# Patient Record
Sex: Male | Born: 1965 | Race: White | Hispanic: No | Marital: Married
Health system: Southern US, Community
[De-identification: ages and names within clinical notes are randomized; demographics above are authoritative.]

## PROBLEM LIST (undated history)

## (undated) DIAGNOSIS — E785 Hyperlipidemia, unspecified: Secondary | ICD-10-CM

## (undated) HISTORY — PX: CARDIAC CATHETERIZATION: SHX172

---

## 1999-05-06 ENCOUNTER — Ambulatory Visit (HOSPITAL_COMMUNITY): Admission: RE | Admit: 1999-05-06 | Discharge: 1999-05-06 | Payer: Self-pay | Admitting: *Deleted

## 2013-02-22 ENCOUNTER — Encounter: Payer: Self-pay | Admitting: Internal Medicine

## 2013-03-15 ENCOUNTER — Ambulatory Visit: Payer: Self-pay | Admitting: Internal Medicine

## 2017-09-19 DIAGNOSIS — H66009 Acute suppurative otitis media without spontaneous rupture of ear drum, unspecified ear: Secondary | ICD-10-CM | POA: Diagnosis not present

## 2017-09-29 DIAGNOSIS — H66009 Acute suppurative otitis media without spontaneous rupture of ear drum, unspecified ear: Secondary | ICD-10-CM | POA: Diagnosis not present

## 2017-12-07 DIAGNOSIS — H9312 Tinnitus, left ear: Secondary | ICD-10-CM | POA: Diagnosis not present

## 2017-12-07 DIAGNOSIS — H9123 Sudden idiopathic hearing loss, bilateral: Secondary | ICD-10-CM | POA: Diagnosis not present

## 2017-12-07 DIAGNOSIS — H9042 Sensorineural hearing loss, unilateral, left ear, with unrestricted hearing on the contralateral side: Secondary | ICD-10-CM | POA: Diagnosis not present

## 2017-12-19 DIAGNOSIS — H9122 Sudden idiopathic hearing loss, left ear: Secondary | ICD-10-CM | POA: Diagnosis not present

## 2017-12-29 ENCOUNTER — Other Ambulatory Visit: Payer: Self-pay | Admitting: Otolaryngology

## 2017-12-29 DIAGNOSIS — H9122 Sudden idiopathic hearing loss, left ear: Secondary | ICD-10-CM

## 2018-01-14 ENCOUNTER — Ambulatory Visit
Admission: RE | Admit: 2018-01-14 | Discharge: 2018-01-14 | Disposition: A | Discharge status: Home / Self Care | Payer: 59 | Source: Ambulatory Visit | Attending: Otolaryngology | Admitting: Otolaryngology

## 2018-01-14 DIAGNOSIS — H9192 Unspecified hearing loss, left ear: Secondary | ICD-10-CM | POA: Diagnosis not present

## 2018-01-14 DIAGNOSIS — H9122 Sudden idiopathic hearing loss, left ear: Secondary | ICD-10-CM

## 2018-01-14 MED ORDER — GADOBENATE DIMEGLUMINE 529 MG/ML IV SOLN
15.0000 mL | Freq: Once | INTRAVENOUS | Status: AC | PRN
  Administered 2018-01-14: 15 mL via INTRAVENOUS

## 2018-04-24 DIAGNOSIS — J0100 Acute maxillary sinusitis, unspecified: Secondary | ICD-10-CM | POA: Diagnosis not present

## 2018-04-27 DIAGNOSIS — J209 Acute bronchitis, unspecified: Secondary | ICD-10-CM | POA: Diagnosis not present

## 2018-04-27 DIAGNOSIS — J0100 Acute maxillary sinusitis, unspecified: Secondary | ICD-10-CM | POA: Diagnosis not present

## 2019-05-16 HISTORY — PX: TRANSTHORACIC ECHOCARDIOGRAM: SHX275

## 2019-05-29 ENCOUNTER — Encounter (HOSPITAL_COMMUNITY): Payer: Self-pay | Admitting: Cardiology

## 2019-05-29 ENCOUNTER — Other Ambulatory Visit: Payer: Self-pay

## 2019-05-29 ENCOUNTER — Inpatient Hospital Stay (HOSPITAL_COMMUNITY)
Admission: EM | Admit: 2019-05-29 | Discharge: 2019-06-01 | DRG: 246 | Disposition: A | Discharge status: Home / Self Care | Payer: BLUE CROSS/BLUE SHIELD | Attending: Cardiology | Admitting: Cardiology

## 2019-05-29 ENCOUNTER — Inpatient Hospital Stay (HOSPITAL_COMMUNITY)
Admission: EM | Disposition: A | Discharge status: Home / Self Care | Payer: Self-pay | Source: Home / Self Care | Attending: Cardiology

## 2019-05-29 DIAGNOSIS — Z9861 Coronary angioplasty status: Secondary | ICD-10-CM | POA: Diagnosis present

## 2019-05-29 DIAGNOSIS — Y838 Other surgical procedures as the cause of abnormal reaction of the patient, or of later complication, without mention of misadventure at the time of the procedure: Secondary | ICD-10-CM | POA: Diagnosis not present

## 2019-05-29 DIAGNOSIS — I2102 ST elevation (STEMI) myocardial infarction involving left anterior descending coronary artery: Secondary | ICD-10-CM | POA: Diagnosis present

## 2019-05-29 DIAGNOSIS — Y92234 Operating room of hospital as the place of occurrence of the external cause: Secondary | ICD-10-CM | POA: Diagnosis not present

## 2019-05-29 DIAGNOSIS — Y712 Prosthetic and other implants, materials and accessory cardiovascular devices associated with adverse incidents: Secondary | ICD-10-CM | POA: Diagnosis not present

## 2019-05-29 DIAGNOSIS — Z20822 Contact with and (suspected) exposure to covid-19: Secondary | ICD-10-CM | POA: Diagnosis present

## 2019-05-29 DIAGNOSIS — I255 Ischemic cardiomyopathy: Secondary | ICD-10-CM | POA: Diagnosis present

## 2019-05-29 DIAGNOSIS — I2510 Atherosclerotic heart disease of native coronary artery without angina pectoris: Secondary | ICD-10-CM

## 2019-05-29 DIAGNOSIS — E785 Hyperlipidemia, unspecified: Secondary | ICD-10-CM | POA: Diagnosis present

## 2019-05-29 DIAGNOSIS — D72829 Elevated white blood cell count, unspecified: Secondary | ICD-10-CM | POA: Diagnosis not present

## 2019-05-29 DIAGNOSIS — I2109 ST elevation (STEMI) myocardial infarction involving other coronary artery of anterior wall: Secondary | ICD-10-CM | POA: Diagnosis not present

## 2019-05-29 DIAGNOSIS — I25110 Atherosclerotic heart disease of native coronary artery with unstable angina pectoris: Secondary | ICD-10-CM | POA: Diagnosis not present

## 2019-05-29 DIAGNOSIS — R079 Chest pain, unspecified: Secondary | ICD-10-CM | POA: Diagnosis not present

## 2019-05-29 DIAGNOSIS — R000 Tachycardia, unspecified: Secondary | ICD-10-CM | POA: Diagnosis not present

## 2019-05-29 DIAGNOSIS — I9788 Other intraoperative complications of the circulatory system, not elsewhere classified: Secondary | ICD-10-CM | POA: Diagnosis not present

## 2019-05-29 DIAGNOSIS — I252 Old myocardial infarction: Secondary | ICD-10-CM

## 2019-05-29 DIAGNOSIS — I5041 Acute combined systolic (congestive) and diastolic (congestive) heart failure: Secondary | ICD-10-CM | POA: Diagnosis present

## 2019-05-29 DIAGNOSIS — I213 ST elevation (STEMI) myocardial infarction of unspecified site: Secondary | ICD-10-CM

## 2019-05-29 DIAGNOSIS — Z8249 Family history of ischemic heart disease and other diseases of the circulatory system: Secondary | ICD-10-CM

## 2019-05-29 DIAGNOSIS — Z955 Presence of coronary angioplasty implant and graft: Secondary | ICD-10-CM

## 2019-05-29 DIAGNOSIS — R0789 Other chest pain: Secondary | ICD-10-CM | POA: Diagnosis not present

## 2019-05-29 DIAGNOSIS — I10 Essential (primary) hypertension: Secondary | ICD-10-CM | POA: Insufficient documentation

## 2019-05-29 DIAGNOSIS — I499 Cardiac arrhythmia, unspecified: Secondary | ICD-10-CM | POA: Diagnosis not present

## 2019-05-29 HISTORY — DX: Atherosclerotic heart disease of native coronary artery without angina pectoris: I25.10

## 2019-05-29 HISTORY — DX: Old myocardial infarction: I25.2

## 2019-05-29 HISTORY — PX: CORONARY/GRAFT ACUTE MI REVASCULARIZATION: CATH118305

## 2019-05-29 HISTORY — PX: LEFT HEART CATH AND CORONARY ANGIOGRAPHY: CATH118249

## 2019-05-29 HISTORY — DX: Hyperlipidemia, unspecified: E78.5

## 2019-05-29 HISTORY — PX: CORONARY STENT INTERVENTION: CATH118234

## 2019-05-29 HISTORY — DX: ST elevation (STEMI) myocardial infarction of unspecified site: I21.3

## 2019-05-29 LAB — TROPONIN I (HIGH SENSITIVITY)
Troponin I (High Sensitivity): 3656 ng/L (ref <18)
Troponin I (High Sensitivity): >27000 ng/L (ref <18)
Troponin I (High Sensitivity): >27000 ng/L (ref <18)
Troponin I (High Sensitivity): >27000 ng/L (ref <18)
Troponin I (High Sensitivity): >27000 ng/L (ref <18)

## 2019-05-29 LAB — CBC WITH DIFFERENTIAL/PLATELET
Abs Immature Granulocytes: 0.07 10*3/uL (ref 0.00–0.07)
Basophils Absolute: 0.1 10*3/uL (ref 0.0–0.1)
Basophils Relative: 1 %
Eosinophils Absolute: 0.1 10*3/uL (ref 0.0–0.5)
Eosinophils Relative: 0 %
HCT: 43.4 % (ref 39.0–52.0)
Hemoglobin: 15.6 g/dL (ref 13.0–17.0)
Immature Granulocytes: 1 %
Lymphocytes Relative: 19 %
Lymphs Abs: 2.4 10*3/uL (ref 0.7–4.0)
MCH: 31.8 pg (ref 26.0–34.0)
MCHC: 35.9 g/dL (ref 30.0–36.0)
MCV: 88.4 fL (ref 80.0–100.0)
Monocytes Absolute: 1.1 10*3/uL — ABNORMAL HIGH (ref 0.1–1.0)
Monocytes Relative: 8 %
Neutro Abs: 9 10*3/uL — ABNORMAL HIGH (ref 1.7–7.7)
Neutrophils Relative %: 71 %
Platelets: 279 10*3/uL (ref 150–400)
RBC: 4.91 MIL/uL (ref 4.22–5.81)
RDW: 11.5 % (ref 11.5–15.5)
WBC: 12.7 10*3/uL — ABNORMAL HIGH (ref 4.0–10.5)
nRBC: 0 % (ref 0.0–0.2)

## 2019-05-29 LAB — CBC
HCT: 44.5 % (ref 39.0–52.0)
Hemoglobin: 15.9 g/dL (ref 13.0–17.0)
MCH: 31.9 pg (ref 26.0–34.0)
MCHC: 35.7 g/dL (ref 30.0–36.0)
MCV: 89.2 fL (ref 80.0–100.0)
Platelets: 301 10*3/uL (ref 150–400)
RBC: 4.99 MIL/uL (ref 4.22–5.81)
RDW: 11.8 % (ref 11.5–15.5)
WBC: 16.4 10*3/uL — ABNORMAL HIGH (ref 4.0–10.5)
nRBC: 0 % (ref 0.0–0.2)

## 2019-05-29 LAB — COMPREHENSIVE METABOLIC PANEL
ALT: 36 U/L (ref 0–44)
AST: 89 U/L — ABNORMAL HIGH (ref 15–41)
Albumin: 3.7 g/dL (ref 3.5–5.0)
Alkaline Phosphatase: 77 U/L (ref 38–126)
Anion gap: 12 (ref 5–15)
BUN: 8 mg/dL (ref 6–20)
CO2: 19 mmol/L — ABNORMAL LOW (ref 22–32)
Calcium: 8.8 mg/dL — ABNORMAL LOW (ref 8.9–10.3)
Chloride: 105 mmol/L (ref 98–111)
Creatinine, Ser: 0.94 mg/dL (ref 0.61–1.24)
GFR calc Af Amer: >60 mL/min (ref >60)
GFR calc non Af Amer: >60 mL/min (ref >60)
Glucose, Bld: 127 mg/dL — ABNORMAL HIGH (ref 70–99)
Potassium: 3.5 mmol/L (ref 3.5–5.1)
Sodium: 136 mmol/L (ref 135–145)
Total Bilirubin: 0.7 mg/dL (ref 0.3–1.2)
Total Protein: 6.3 g/dL — ABNORMAL LOW (ref 6.5–8.1)

## 2019-05-29 LAB — POCT ACTIVATED CLOTTING TIME
Activated Clotting Time: 0 seconds
Activated Clotting Time: 285 seconds
Activated Clotting Time: 345 seconds
Activated Clotting Time: 351 seconds

## 2019-05-29 LAB — LIPID PANEL
Cholesterol: 169 mg/dL (ref 0–200)
HDL: 30 mg/dL — ABNORMAL LOW (ref >40)
LDL Cholesterol: 126 mg/dL — ABNORMAL HIGH (ref 0–99)
Total CHOL/HDL Ratio: 5.6 RATIO
Triglycerides: 65 mg/dL (ref <150)
VLDL: 13 mg/dL (ref 0–40)

## 2019-05-29 LAB — CREATININE, SERUM
Creatinine, Ser: 1 mg/dL (ref 0.61–1.24)
GFR calc Af Amer: >60 mL/min (ref >60)
GFR calc non Af Amer: >60 mL/min (ref >60)

## 2019-05-29 LAB — TSH: TSH: 1.221 u[IU]/mL (ref 0.350–4.500)

## 2019-05-29 LAB — RESPIRATORY PANEL BY RT PCR (FLU A&B, COVID)
Influenza A by PCR: NEGATIVE
Influenza B by PCR: NEGATIVE
SARS Coronavirus 2 by RT PCR: NEGATIVE

## 2019-05-29 LAB — PROTIME-INR
INR: 1.1 (ref 0.8–1.2)
Prothrombin Time: 14.4 seconds (ref 11.4–15.2)

## 2019-05-29 LAB — POCT I-STAT, CHEM 8
BUN: 7 mg/dL (ref 6–20)
Calcium, Ion: 1.22 mmol/L (ref 1.15–1.40)
Chloride: 103 mmol/L (ref 98–111)
Creatinine, Ser: 0.8 mg/dL (ref 0.61–1.24)
Glucose, Bld: 127 mg/dL — ABNORMAL HIGH (ref 70–99)
HCT: 43 % (ref 39.0–52.0)
Hemoglobin: 14.6 g/dL (ref 13.0–17.0)
Potassium: 3.5 mmol/L (ref 3.5–5.1)
Sodium: 140 mmol/L (ref 135–145)
TCO2: 24 mmol/L (ref 22–32)

## 2019-05-29 LAB — MRSA PCR SCREENING: MRSA by PCR: NEGATIVE

## 2019-05-29 LAB — APTT: aPTT: 31 seconds (ref 24–36)

## 2019-05-29 LAB — HIV ANTIBODY (ROUTINE TESTING W REFLEX): HIV Screen 4th Generation wRfx: NONREACTIVE

## 2019-05-29 SURGERY — CORONARY/GRAFT ACUTE MI REVASCULARIZATION
Anesthesia: LOCAL

## 2019-05-29 MED ORDER — MIDAZOLAM HCL 2 MG/2ML IJ SOLN
INTRAMUSCULAR | Status: DC | PRN
  Administered 2019-05-29: 2 mg via INTRAVENOUS

## 2019-05-29 MED ORDER — HEPARIN (PORCINE) IN NACL 1000-0.9 UT/500ML-% IV SOLN
INTRAVENOUS | Status: DC | PRN
  Administered 2019-05-29: 500 mL

## 2019-05-29 MED ORDER — HEPARIN SODIUM (PORCINE) 1000 UNIT/ML IJ SOLN
INTRAMUSCULAR | Status: DC | PRN
  Administered 2019-05-29: 9000 [IU] via INTRAVENOUS
  Administered 2019-05-29: 3000 [IU] via INTRAVENOUS

## 2019-05-29 MED ORDER — LIDOCAINE HCL (PF) 1 % IJ SOLN
INTRAMUSCULAR | Status: AC
  Filled 2019-05-29: qty 30

## 2019-05-29 MED ORDER — VERAPAMIL HCL 2.5 MG/ML IV SOLN
INTRAVENOUS | Status: AC
  Filled 2019-05-29: qty 2

## 2019-05-29 MED ORDER — ACETAMINOPHEN 325 MG PO TABS
650.0000 mg | ORAL_TABLET | ORAL | Status: DC | PRN
  Administered 2019-05-30 – 2019-05-31 (×3): 650 mg via ORAL
  Filled 2019-05-29 (×3): qty 2

## 2019-05-29 MED ORDER — HEPARIN SODIUM (PORCINE) 1000 UNIT/ML IJ SOLN
INTRAMUSCULAR | Status: AC
  Filled 2019-05-29: qty 1

## 2019-05-29 MED ORDER — NITROGLYCERIN 1 MG/10 ML FOR IR/CATH LAB
INTRA_ARTERIAL | Status: DC | PRN
  Administered 2019-05-29 (×2): 100 ug

## 2019-05-29 MED ORDER — HEPARIN (PORCINE) IN NACL 1000-0.9 UT/500ML-% IV SOLN
INTRAVENOUS | Status: DC | PRN
  Administered 2019-05-29 (×2): 500 mL

## 2019-05-29 MED ORDER — IOHEXOL 350 MG/ML SOLN
INTRAVENOUS | Status: AC
  Filled 2019-05-29: qty 1

## 2019-05-29 MED ORDER — MIDAZOLAM HCL 2 MG/2ML IJ SOLN
INTRAMUSCULAR | Status: AC
  Filled 2019-05-29: qty 2

## 2019-05-29 MED ORDER — MORPHINE SULFATE (PF) 2 MG/ML IV SOLN
2.0000 mg | INTRAVENOUS | Status: AC | PRN
  Administered 2019-05-29 (×2): 2 mg via INTRAVENOUS
  Filled 2019-05-29 (×2): qty 1

## 2019-05-29 MED ORDER — TIROFIBAN (AGGRASTAT) BOLUS VIA INFUSION
INTRAVENOUS | Status: DC | PRN
  Administered 2019-05-29: 2142.5 ug via INTRAVENOUS

## 2019-05-29 MED ORDER — TICAGRELOR 90 MG PO TABS
ORAL_TABLET | ORAL | Status: DC | PRN
  Administered 2019-05-29: 180 mg via ORAL

## 2019-05-29 MED ORDER — SODIUM CHLORIDE 0.9% FLUSH
3.0000 mL | Freq: Two times a day (BID) | INTRAVENOUS | Status: DC
  Administered 2019-05-29 – 2019-05-31 (×4): 3 mL via INTRAVENOUS

## 2019-05-29 MED ORDER — TICAGRELOR 90 MG PO TABS
ORAL_TABLET | ORAL | Status: AC
  Filled 2019-05-29: qty 2

## 2019-05-29 MED ORDER — HEPARIN (PORCINE) IN NACL 1000-0.9 UT/500ML-% IV SOLN
INTRAVENOUS | Status: AC
  Filled 2019-05-29: qty 1000

## 2019-05-29 MED ORDER — ASPIRIN 81 MG PO CHEW
81.0000 mg | CHEWABLE_TABLET | Freq: Every day | ORAL | Status: DC
  Administered 2019-05-30 – 2019-06-01 (×3): 81 mg via ORAL
  Filled 2019-05-29 (×3): qty 1

## 2019-05-29 MED ORDER — LIDOCAINE HCL (PF) 1 % IJ SOLN
INTRAMUSCULAR | Status: DC | PRN
  Administered 2019-05-29: 2 mL

## 2019-05-29 MED ORDER — FENTANYL CITRATE (PF) 100 MCG/2ML IJ SOLN
INTRAMUSCULAR | Status: DC | PRN
  Administered 2019-05-29 (×2): 25 ug via INTRAVENOUS
  Administered 2019-05-29 (×2): 50 ug via INTRAVENOUS

## 2019-05-29 MED ORDER — ENOXAPARIN SODIUM 40 MG/0.4ML ~~LOC~~ SOLN
40.0000 mg | SUBCUTANEOUS | Status: DC
  Administered 2019-05-30 – 2019-06-01 (×3): 40 mg via SUBCUTANEOUS
  Filled 2019-05-29 (×3): qty 0.4

## 2019-05-29 MED ORDER — ZOLPIDEM TARTRATE 5 MG PO TABS
5.0000 mg | ORAL_TABLET | Freq: Every evening | ORAL | Status: DC | PRN
  Administered 2019-05-29 – 2019-05-30 (×2): 5 mg via ORAL
  Filled 2019-05-29 (×2): qty 1

## 2019-05-29 MED ORDER — ATORVASTATIN CALCIUM 80 MG PO TABS
80.0000 mg | ORAL_TABLET | Freq: Every day | ORAL | Status: DC
  Administered 2019-05-29 – 2019-05-31 (×3): 80 mg via ORAL
  Filled 2019-05-29 (×3): qty 1

## 2019-05-29 MED ORDER — IOHEXOL 350 MG/ML SOLN
INTRAVENOUS | Status: DC | PRN
  Administered 2019-05-29: 11:00:00 240 mL

## 2019-05-29 MED ORDER — TIROFIBAN HCL IN NACL 5-0.9 MG/100ML-% IV SOLN
INTRAVENOUS | Status: AC | PRN
  Administered 2019-05-29: 0.15 ug/kg/min via INTRAVENOUS

## 2019-05-29 MED ORDER — HYDRALAZINE HCL 20 MG/ML IJ SOLN: 10.0000 mg | INTRAMUSCULAR | Status: AC | PRN

## 2019-05-29 MED ORDER — SODIUM CHLORIDE 0.9 % IV SOLN: INTRAVENOUS | Status: AC

## 2019-05-29 MED ORDER — FENTANYL CITRATE (PF) 100 MCG/2ML IJ SOLN
INTRAMUSCULAR | Status: AC
  Filled 2019-05-29: qty 2

## 2019-05-29 MED ORDER — SODIUM CHLORIDE 0.9% FLUSH
3.0000 mL | INTRAVENOUS | Status: DC | PRN
  Administered 2019-05-29 (×2): 3 mL via INTRAVENOUS

## 2019-05-29 MED ORDER — NITROGLYCERIN 1 MG/10 ML FOR IR/CATH LAB
INTRA_ARTERIAL | Status: AC
  Filled 2019-05-29: qty 10

## 2019-05-29 MED ORDER — ONDANSETRON HCL 4 MG/2ML IJ SOLN
4.0000 mg | Freq: Four times a day (QID) | INTRAMUSCULAR | Status: DC | PRN
  Administered 2019-05-29 (×2): 4 mg via INTRAVENOUS
  Filled 2019-05-29 (×2): qty 2

## 2019-05-29 MED ORDER — METOPROLOL TARTRATE 25 MG PO TABS
25.0000 mg | ORAL_TABLET | Freq: Two times a day (BID) | ORAL | Status: DC
  Administered 2019-05-29: 25 mg via ORAL
  Filled 2019-05-29 (×2): qty 1

## 2019-05-29 MED ORDER — SODIUM CHLORIDE 0.9 % IV SOLN: 250.0000 mL | INTRAVENOUS | Status: DC | PRN

## 2019-05-29 MED ORDER — NITROGLYCERIN 0.4 MG SL SUBL: 0.4000 mg | SUBLINGUAL_TABLET | SUBLINGUAL | Status: DC | PRN

## 2019-05-29 MED ORDER — FUROSEMIDE 10 MG/ML IJ SOLN
40.0000 mg | Freq: Once | INTRAMUSCULAR | Status: AC
  Administered 2019-05-29: 40 mg via INTRAVENOUS
  Filled 2019-05-29: qty 4

## 2019-05-29 MED ORDER — ACETAMINOPHEN 325 MG PO TABS: 650.0000 mg | ORAL_TABLET | ORAL | Status: DC | PRN

## 2019-05-29 MED ORDER — TIROFIBAN HCL IN NACL 5-0.9 MG/100ML-% IV SOLN
INTRAVENOUS | Status: AC
  Filled 2019-05-29: qty 100

## 2019-05-29 MED ORDER — CHLORHEXIDINE GLUCONATE CLOTH 2 % EX PADS
6.0000 | MEDICATED_PAD | Freq: Every day | CUTANEOUS | Status: DC
  Administered 2019-05-30 – 2019-05-31 (×2): 6 via TOPICAL

## 2019-05-29 MED ORDER — LABETALOL HCL 5 MG/ML IV SOLN: 10.0000 mg | INTRAVENOUS | Status: AC | PRN

## 2019-05-29 MED ORDER — TICAGRELOR 90 MG PO TABS
90.0000 mg | ORAL_TABLET | Freq: Two times a day (BID) | ORAL | Status: DC
  Administered 2019-05-29 – 2019-06-01 (×6): 90 mg via ORAL
  Filled 2019-05-29 (×6): qty 1

## 2019-05-29 MED ORDER — ALPRAZOLAM 0.25 MG PO TABS: 0.2500 mg | ORAL_TABLET | Freq: Two times a day (BID) | ORAL | Status: DC | PRN

## 2019-05-29 SURGICAL SUPPLY — 25 items
BALLN SAPPHIRE 2.0X12 (BALLOONS) ×2
BALLN SAPPHIRE ~~LOC~~ 2.75X15 (BALLOONS) ×1 IMPLANT
BALLN SAPPHIRE ~~LOC~~ 3.0X15 (BALLOONS) ×1 IMPLANT
BALLN SAPPHIRE ~~LOC~~ 3.5X12 (BALLOONS) ×1 IMPLANT
BALLOON SAPPHIRE 2.0X12 (BALLOONS) IMPLANT
CATH INFINITI 5FR ANG PIGTAIL (CATHETERS) ×1 IMPLANT
CATH OPTITORQUE TIG 4.0 5F (CATHETERS) ×1 IMPLANT
CATH VISTA GUIDE 6FR XBLAD3.5 (CATHETERS) ×1 IMPLANT
DEVICE RAD COMP TR BAND LRG (VASCULAR PRODUCTS) ×1 IMPLANT
ELECT DEFIB PAD ADLT CADENCE (PAD) ×1 IMPLANT
GLIDESHEATH SLEND SS 6F .021 (SHEATH) ×1 IMPLANT
GUIDEWIRE INQWIRE 1.5J.035X260 (WIRE) IMPLANT
INQWIRE 1.5J .035X260CM (WIRE) ×2
KIT ENCORE 26 ADVANTAGE (KITS) ×2 IMPLANT
KIT HEART LEFT (KITS) ×2 IMPLANT
PACK CARDIAC CATHETERIZATION (CUSTOM PROCEDURE TRAY) ×2 IMPLANT
SHEATH PROBE COVER 6X72 (BAG) ×1 IMPLANT
STENT RESOLUTE ONYX 2.0X12 (Permanent Stent) ×1 IMPLANT
STENT RESOLUTE ONYX 2.25X15 (Permanent Stent) ×1 IMPLANT
STENT RESOLUTE ONYX 2.5X38 (Permanent Stent) ×1 IMPLANT
STENT RESOLUTE ONYX 3.0X12 (Permanent Stent) ×1 IMPLANT
TRANSDUCER W/STOPCOCK (MISCELLANEOUS) ×2 IMPLANT
TUBING CIL FLEX 10 FLL-RA (TUBING) ×2 IMPLANT
WIRE ASAHI PROWATER 180CM (WIRE) ×1 IMPLANT
WIRE PT2 MS 185 (WIRE) ×1 IMPLANT

## 2019-05-29 NOTE — Progress Notes (Signed)
CRITICAL VALUE ALERT  Critical Value:  Troponin > 35789  Date & Time Notied: 1616, 05/29/2019   Provider Notified: MD Herbie Baltimore  Orders Received/Actions taken: No new orders

## 2019-05-29 NOTE — Brief Op Note (Signed)
05/29/2019  11:50 AM  PATIENT:  [name] [name]  54 y.o. male with no significant past medical history who presented with now 2 and half days of intermittent chest pain starting from Monday night (05/27/2019).  Had pain on Monday night and then significant pain after lunch on Tuesday.  He was having worsening symptoms with with exertion, but these episodes were at rest.  Then Tuesday night he began having chest pain that did not go away.  He went to urgent care where he was found to have abnormal EKG concerning for possible STEMI.  Upon EMS arrival EKG clearly confirmed ST elevations in V1 V2, aVR and aVL with significant ST depressions in ruminal 2, ruminal 3, aVF, V5 V6 with less prominent depressions in V3 and V4.  PRE-OPERATIVE DIAGNOSIS: ANTERIOR STEMI  POST-OPERATIVE DIAGNOSIS:  Severe Three-Vessel Disease: Culprit lesion 100% LAD after SP1 (long lesion); 90% proximal OM 2 (with moderate LPL 160%), likely 100 and CTO of RPDA (high bifurcation, fills via left to right collateral flow) Successful DES PCI of extensive proximal to  mid LAD using 3 overlapping Resolute Onyx DES stents (3.0 mm x 12 mm, 2.5 mm x 38 mm, and 2.25 mm x 15 mm -> entire segment postdilated from 3.65 mm down to 2.8 mm) Successful stenting of proximal OM 2 with resolute Onyx DES 2.0 mm x 12 mm--postdilated  to 2.2 mm) Moderate severely reduced EF estimated as 35 to 40% with mid to apical anterior hypo to akinesis = ISCHEMIC CARDIOMYOPATHY and ACUTE SYSTOLIC HEART FAILURE Moderate elevated LVEDP of 22 mm.  ACUTE DIASTOLIC HEART FAILURE  PROCEDURE:  Procedure(s): CORONARY/GRAFT ACUTE MI REVASCULARIZATION (N/A)  Right radial access (ultrasound-guided)-6 [place] sheath  5 [place] TIG 4.0 catheter used for left and right 20 angiography -> revealed likely CTO of RPDA and culprit lesion of 100% LAD.  Preparations made for PCI.  After bolus of IV heparin, IV Aggrastat and oral Brilinta, PCI of the LAD and OM2 were  performed.  PCI: XB LAD 3.5 guide -> initial LAD wiring with Choice PT wire, initial PCI with 2.0 mm balloon.  3 overlapping DES stents placed as noted  PCI OM2 performed-2.0 mm balloon PTCA followed by 2.0 mm 12 mm DES postdilated with stent balloon.  Following PCI, angle pigtail catheter used for LV hemodynamics and LV gram.  Catheters removed.  TR band placed for hemostasis.  SURGEON:  Surgeon(s) and Role:    * Marykay Lex, MD - Primary  PHYSICIAN ASSISTANT:   ANESTHESIA:   local and IV sedation; , 3 mL lidocaine, 2 mg IV Versed, 150 micrograms fentanyl  EBL:  <20 mL  MEDICATIONS USED: Contrast-240 mL, total heparin 13,000 mL, IV Aggrastat bolus and drip (to continue until current bag complete)  TR band placed  DICTATION: .Note written in EPIC  PLAN OF CARE: Admit to inpatient   PATIENT DISPOSITION:  ICU - extubated and stable.   Continue Aggrastat until current bottle complete  Check 2D echocardiogram in 1 to 2 days to reevaluate EF  Gradually start beta-blocker plus or minus ARB  Monitor for potential need for diuretic Would not fast-track discharge    [name] Lemma, MD

## 2019-05-29 NOTE — H&P (Signed)
Cardiology Admission History and Physical:   Patient ID: [name] [name]; MRN: [id number]; DOB: [birth year]   Admission date: 05/29/2019  Primary Care Provider: No primary care provider on file. Primary Cardiologist: Glenetta Hew, MD New Primary Electrophysiologist: None    Chief Complaint:  STEMI  Patient Profile:   [name] [name] is a 54 y.o. male with a history of hyperlipidemia.  He has been having chest pain for 2 days, and finally went to urgent care today.  His ECG was consistent with an anterior STEMI and he was taken emergently to [facility] to the Cath Lab.  History of Present Illness:   [name] [name] had a checkup a couple of years ago.  At that time, he was told his cholesterol was high.  Blood pressure and blood sugar were okay.  He does not smoke or drink.  His wife admits that they eat a lot of fast food.  No family history of premature coronary artery disease.  About 2 days ago, he developed chest pain.  He described it as indigestion and a pressure.  He could not lie down or sleep.  He could not get comfortable.  He tried multiple antacids without relief.  Finally, today, he went to urgent care.  His ECG was consistent with an anterior STEMI.  He was given ASA 324 mg and EMS was called.  EMS gave him 3 sublingual nitroglycerin in route to the hospital.  His ECG was significantly abnormal on arrival to the hospital and he was taken emergently to the Cath Lab.  He has been having occasional symptoms of chest pain that radiated to his left arm with some numbness for a couple of years.  He has never been evaluated for these.   Past Medical History:  Diagnosis Date  . Hyperlipidemia LDL goal <70   . STEMI (ST elevation myocardial infarction) ([facility]) 05/29/2019    History reviewed. No pertinent surgical history.   Medications Prior to Admission: Prior to Admission medications   No prescriptions     Allergies:   No Known Allergies  Social History:   Social History    Socioeconomic History  . Marital status: Married    Spouse name: Not on file  . Number of children: Not on file  . Years of education: Not on file  . Highest education level: Not on file  Occupational History  . Occupation: Financial trader: [employer]  Tobacco Use  . Smoking status: Never Smoker  . Smokeless tobacco: Never Used  Substance and Sexual Activity  . Alcohol use: Never  . Drug use: Never  . Sexual activity: Not on file  Other Topics Concern  . Not on file  Social History Narrative   Lives with his wife in [place].   Social Determinants of Health   Financial Resource Strain:   . Difficulty of Paying Living Expenses:   Food Insecurity:   . Worried About Charity fundraiser in the Last Year:   . Arboriculturist in the Last Year:   Transportation Needs:   . Film/video editor (Medical):   Marland Kitchen Lack of Transportation (Non-Medical):   Physical Activity:   . Days of Exercise per Week:   . Minutes of Exercise per Session:   Stress:   . Feeling of Stress :   Social Connections:   . Frequency of Communication with Friends and Family:   . Frequency of Social Gatherings with Friends  and Family:   . Attends Religious Services:   . Active Member of Clubs or Organizations:   . Attends Banker Meetings:   Marland Kitchen Marital Status:   Intimate Partner Violence:   . Fear of Current or Ex-Partner:   . Emotionally Abused:   Marland Kitchen Physically Abused:   . Sexually Abused:     Family History:  The patient's family history includes Hypertension in his mother.   The patient He indicated that his mother is alive. He indicated that his father is alive.    ROS:  Please see the history of present illness.  No other info obtainable due to patient condition  Physical Exam/Data:   Vitals:   05/29/19 0900 05/29/19 0917  SpO2:  98%  Weight: 85.7 kg   Height: 5' 7.5" (1.715 m)    No intake or output data in the 24 hours ending  05/29/19 1015 Filed Weights   05/29/19 0900  Weight: 85.7 kg   Body mass index is 29.16 kg/m.  General:  Well nourished, well developed, male in acute distress HEENT: normal Lymph: no adenopathy Neck:  JVD not seen elevated Endocrine:  No thryomegaly Vascular: No carotid bruits; 4/4 extremity pulses 2+ bilaterally  Cardiac:  normal S1, S2; RRR; no murmur, no rub or gallop  Lungs:  clear to auscultation bilaterally, no wheezing, rhonchi or rales  Abd: soft, nontender, no hepatomegaly  Ext: No edema Musculoskeletal:  No deformities, BUE and BLE strength normal and equal Skin: warm and dry  Neuro:  CNs 2-12 intact, no focal abnormalities noted Psych:  Normal affect    EKG:  The ECG was personally reviewed: Sinus rhythm with anterior ST elevation consistent with STEMI  Relevant CV Studies:  None  Laboratory Data: Pending  ChemistryNo results for input(s): NA, K, CL, CO2, GLUCOSE, BUN, CREATININE, CALCIUM, GFRNONAA, GFRAA, ANIONGAP in the last 168 hours.  No results for input(s): PROT, ALBUMIN, AST, ALT, ALKPHOS, BILITOT in the last 168 hours. HematologyNo results for input(s): WBC, RBC, HGB, HCT, MCV, MCH, MCHC, RDW, PLT in the last 168 hours. Cardiac Enzymes  High Sensitivity Troponin:  No results for input(s): TROPONINIHS in the last 720 hours.   BNPNo results for input(s): BNP, PROBNP in the last 168 hours.  DDimer No results for input(s): DDIMER in the last 168 hours. Lipids: No results found for: CHOL, HDL, LDLCALC, LDLDIRECT, TRIG, CHOLHDL INR: No results found for: INR, PROTIME A1c: No results found for: HGBA1C Thyroid: No results found for: TSH, T3TOTAL, T4TOTAL, THYROIDAB  Radiology/Studies:  No results found.  Assessment and Plan:   1.  STEMI: -He was given aspirin and nitro and taken emergently to the Cath Lab. -Further evaluation and treatment depending on the results. -We will screen for cardiac risk factors and their control. -Add high-dose statin; and  beta-blocker as blood pressure will tolerate  Principal Problem:   STEMI (ST elevation myocardial infarction) ([facility]) Active Problems:   Hyperlipidemia LDL goal <70     For questions or updates, please contact [facility] Please consult [email or web address] for contact info under Cardiology/STEMI.    SignedTheodore Demark, PA-C  05/29/2019 10:15 AM

## 2019-05-29 NOTE — H&P (Signed)
   [name] [name] presented with signs symptoms of anterior STEMI.  He was brought emergently to the heart catheterization lab for cardiac catheterization.  Planned procedure LEFT HEART CATHETERIZATION WITH CORONARY ANGIOGRAPHY AND PCI.  Cath Lab Visit (complete for each Cath Lab visit)  Clinical Evaluation Leading to the Procedure:   ACS: Yes.    Non-ACS:    Anginal Classification: CCS IV  Anti-ischemic medical therapy: No Therapy  Non-Invasive Test Results: No non-invasive testing performed  Prior CABG: No previous CABG  Full H&P pending.   [name], MD

## 2019-05-29 NOTE — Progress Notes (Signed)
Pt pain upon arrival was 2 out of 10. Once patient had been here 15-20 minutes, started complaining of chest pain of 4 out of 10. Pt. Described pain as being the same prior to cardiac cath. PRN morphine 2mg  given with no relief. Paged MD and informed of pt status.  Verbal orders to give 40 Mg of Lasix and additional 2 mg of Morphine.

## 2019-05-30 ENCOUNTER — Inpatient Hospital Stay (HOSPITAL_COMMUNITY): Payer: BLUE CROSS/BLUE SHIELD

## 2019-05-30 DIAGNOSIS — I2109 ST elevation (STEMI) myocardial infarction involving other coronary artery of anterior wall: Secondary | ICD-10-CM | POA: Diagnosis not present

## 2019-05-30 DIAGNOSIS — I5041 Acute combined systolic (congestive) and diastolic (congestive) heart failure: Secondary | ICD-10-CM | POA: Diagnosis not present

## 2019-05-30 DIAGNOSIS — I255 Ischemic cardiomyopathy: Secondary | ICD-10-CM | POA: Diagnosis not present

## 2019-05-30 DIAGNOSIS — E785 Hyperlipidemia, unspecified: Secondary | ICD-10-CM | POA: Diagnosis not present

## 2019-05-30 DIAGNOSIS — I25110 Atherosclerotic heart disease of native coronary artery with unstable angina pectoris: Secondary | ICD-10-CM

## 2019-05-30 HISTORY — PX: TRANSTHORACIC ECHOCARDIOGRAM: SHX275

## 2019-05-30 LAB — CBC
HCT: 44.3 % (ref 39.0–52.0)
Hemoglobin: 15.5 g/dL (ref 13.0–17.0)
MCH: 31.3 pg (ref 26.0–34.0)
MCHC: 35 g/dL (ref 30.0–36.0)
MCV: 89.5 fL (ref 80.0–100.0)
Platelets: 281 10*3/uL (ref 150–400)
RBC: 4.95 MIL/uL (ref 4.22–5.81)
RDW: 11.9 % (ref 11.5–15.5)
WBC: 15.5 10*3/uL — ABNORMAL HIGH (ref 4.0–10.5)
nRBC: 0 % (ref 0.0–0.2)

## 2019-05-30 LAB — ECHOCARDIOGRAM COMPLETE
Height: 67.5 in
Weight: 2931.24 oz

## 2019-05-30 LAB — BASIC METABOLIC PANEL
Anion gap: 11 (ref 5–15)
BUN: 12 mg/dL (ref 6–20)
CO2: 27 mmol/L (ref 22–32)
Calcium: 9 mg/dL (ref 8.9–10.3)
Chloride: 101 mmol/L (ref 98–111)
Creatinine, Ser: 1.25 mg/dL — ABNORMAL HIGH (ref 0.61–1.24)
GFR calc Af Amer: >60 mL/min (ref >60)
GFR calc non Af Amer: >60 mL/min (ref >60)
Glucose, Bld: 113 mg/dL — ABNORMAL HIGH (ref 70–99)
Potassium: 3.5 mmol/L (ref 3.5–5.1)
Sodium: 139 mmol/L (ref 135–145)

## 2019-05-30 LAB — HEMOGLOBIN A1C
Hgb A1c MFr Bld: 5.6 % (ref 4.8–5.6)
Mean Plasma Glucose: 114 mg/dL

## 2019-05-30 MED ORDER — CARVEDILOL 6.25 MG PO TABS
6.2500 mg | ORAL_TABLET | Freq: Two times a day (BID) | ORAL | Status: DC
  Administered 2019-05-30 – 2019-06-01 (×4): 6.25 mg via ORAL
  Filled 2019-05-30 (×4): qty 1

## 2019-05-30 MED FILL — Verapamil HCl IV Soln 2.5 MG/ML: INTRAVENOUS | Qty: 2 | Status: AC

## 2019-05-30 NOTE — Progress Notes (Signed)
Echocardiogram 2D Echocardiogram has been performed.  Warren Lacy Md Smola 05/30/2019, 1:04 PM

## 2019-05-30 NOTE — Progress Notes (Signed)
Progress Note  Patient Name: [name] [name] Date of Encounter: 05/30/2019  Primary Cardiologist: Glenetta Hew, MD   Subjective   Postop day #1 large anterior STEMI treated with PCI and drug-eluting stenting using 3 stents in the proximal mid LAD with stent in OM 2 and an occluded RCA.  He has severe LV dysfunction.  He is currently pain-free.  Inpatient Medications    Scheduled Meds: . aspirin  81 mg Oral Daily  . atorvastatin  80 mg Oral q1800  . Chlorhexidine Gluconate Cloth  6 each Topical Daily  . enoxaparin (LOVENOX) injection  40 mg Subcutaneous Q24H  . metoprolol tartrate  25 mg Oral BID  . sodium chloride flush  3 mL Intravenous Q12H  . ticagrelor  90 mg Oral BID   Continuous Infusions: . sodium chloride     PRN Meds: sodium chloride, acetaminophen, ALPRAZolam, morphine injection, nitroGLYCERIN, ondansetron (ZOFRAN) IV, sodium chloride flush, zolpidem   Vital Signs    Vitals:   05/30/19 0400 05/30/19 0500 05/30/19 0600 05/30/19 0719  BP: 110/76 (!) 114/94 103/70   Pulse: 86 82 89   Resp: 18 (!) 22 (!) 22   Temp:    99 F (37.2 C)  TempSrc:      SpO2: 96% 96% 94%   Weight:      Height:        Intake/Output Summary (Last 24 hours) at 05/30/2019 0824 Last data filed at 05/30/2019 0330 Gross per 24 hour  Intake 1620.93 ml  Output 2475 ml  Net -854.07 ml   Last 3 Weights 05/30/2019 05/29/2019 05/29/2019  Weight (lbs) 183 lb 3.2 oz 181 lb 189 lb  Weight (kg) 83.1 kg 82.1 kg 85.73 kg      Telemetry    Sinus rhythm- Personally Reviewed  ECG    Normal sinus rhythm 86 with septal Q waves- Personally Reviewed  Physical Exam   GEN: No acute distress.   Neck: No JVD Cardiac: RRR, no murmurs, rubs, or gallops.  Respiratory: Clear to auscultation bilaterally. GI: Soft, nontender, non-distended  MS: No edema; No deformity. Neuro:  Nonfocal  Psych: Normal affect   Labs    High Sensitivity Troponin:   Recent Labs  Lab 05/29/19 0934 05/29/19 1209  05/29/19 1359 05/29/19 1546 05/29/19 1952  TROPONINIHS 3,656* >27,000* >27,000* >27,000* >27,000*      Chemistry Recent Labs  Lab 05/29/19 0930 05/29/19 0930 05/29/19 0934 05/29/19 1209 05/30/19 0216  NA 140  --  136  --  139  K 3.5  --  3.5  --  3.5  CL 103  --  105  --  101  CO2  --   --  19*  --  27  GLUCOSE 127*  --  127*  --  113*  BUN 7  --  8  --  12  CREATININE 0.80   < > 0.94 1.00 1.25*  CALCIUM  --   --  8.8*  --  9.0  PROT  --   --  6.3*  --   --   ALBUMIN  --   --  3.7  --   --   AST  --   --  89*  --   --   ALT  --   --  36  --   --   ALKPHOS  --   --  77  --   --   BILITOT  --   --  0.7  --   --  GFRNONAA  --   --  >60 >60 >60  GFRAA  --   --  >60 >60 >60  ANIONGAP  --   --  12  --  11   < > = values in this interval not displayed.     Hematology Recent Labs  Lab 05/29/19 0934 05/29/19 1211 05/30/19 0216  WBC 12.7* 16.4* 15.5*  RBC 4.91 4.99 4.95  HGB 15.6 15.9 15.5  HCT 43.4 44.5 44.3  MCV 88.4 89.2 89.5  MCH 31.8 31.9 31.3  MCHC 35.9 35.7 35.0  RDW 11.5 11.8 11.9  PLT 279 301 281    BNPNo results for input(s): BNP, PROBNP in the last 168 hours.   DDimer No results for input(s): DDIMER in the last 168 hours.   Radiology    CARDIAC CATHETERIZATION  Result Date: 05/29/2019  CULPRIT LESION prox LAD to Dist LAD lesion is 100% stenosed.  Initially, a drug-eluting stent was successfully placed using a STENT RESOLUTE ONYX 2.5X38. (Eventually postdilated to 3.5 mm proximal and 3.1 mm distal)  Following initial stent placement, there was a distal edge tear focal 90% stenosis (stent had been pulled back during deployment likely related to deep inspiration.  A drug-eluting stent was successfully placed through the initial stent to cover the downstream anterior, using a STENT RESOLUTE ONYX 2.25X15. The stent balloon was used to post dilate the overlapping segment. The proximal 4/5 of the stent was postdilated to 2.8 mm.  A third drug-eluting stent  was successfully placed overlapping proximally to cover the full lesion, using a STENT RESOLUTE ONYX 3.0X12. Postdilated to 3.6 mm  Post intervention, there is a 0% residual stenosis throughout the entire 3 stented segment. Sequentially increasing noncompliant balloons sizes were used starting at 2.75 and gradually up to 3.5 mm to post dilate not just the proximal stent, pulse the entire mid 38 mm stent.  -------------------------------  2nd Vessel lesion: 2nd Mrg-1 lesion is 90% stenosed. Plan was to proceed with PCI.  A drug-eluting stent was successfully placed using a STENT RESOLUTE ONYX 2.0X12.  Post intervention, there is a 0% residual stenosis.  ---------------------------------  3rd Vesssel lesion: RPDA lesion is 100% stenosed with the distal segment somewhat filled via left to right collaterals. This was felt to be CTO & the plan is to treat medically.  ---------------------------------  There is moderate left ventricular systolic dysfunction. The left ventricular ejection fraction is 35-45% by visual estimate.  LV end diastolic pressure is moderately elevated -22 mm  Severe Three-Vessel Disease:  Culprit lesion 100% LAD after SP1 (long lesion); 90% proximal OM 2 (with moderate LPL 160%), likely 100 and CTO of RPDA (high bifurcation, fills via left to right collateral flow)  Successful DES PCI of extensive proximal to mid LAD using 3 overlapping Resolute Onyx DES stents (3.0 mm x 12 mm, 2.5 mm x 38 mm, and 2.25 mm x 15 mm -> entire segment postdilated from 3.65 mm down to 2.8 mm)  EKG: Still denies of depressions but immediately of this of the EKG you are you can email he can successful stenting of proximal OM 2 with resolute Onyx DES 2.0 mm x 12 mm--postdilated to 2.2 mm)  Moderate severely reduced EF estimated as 35 to 40% with mid to apical anterior hypo to akinesis = ISCHEMIC CARDIOMYOPATHY and ACUTE SYSTOLIC HEART FAILURE  Moderate elevated LVEDP of 22 mm.  ACUTE DIASTOLIC HEART FAILURE  RECOMMENDATIONS:  Admit to CVICU, continue Aggrastat until current bottle complete.   Continue to treat  occluded (CTO) of RPDA medically for now.  Would wait until 4/15 or 4/16 to check 2D echo.  Borderline pressures in the Cath Lab -> recommend low-dose beta-blocker plus or minus ARB if possible to start prior to discharge.  Monitor for signs of orthopnea, low threshold to consider IV diuresis given LVEDP of 22 mmHg. Given the size of his infarct, and two-vessel PCI, would probably not consider fast-track candidate. [name], MD   Cardiac Studies   Cardiac catheterization/PCI drug-eluting stenting (05/29/2019)  Conclusion    CULPRIT LESION prox LAD to Dist LAD lesion is 100% stenosed.  Initially, a drug-eluting stent was successfully placed using a STENT RESOLUTE ONYX 2.5X38. (Eventually postdilated to 3.5 mm proximal and 3.1 mm distal)  Following initial stent placement, there was a distal edge tear focal 90% stenosis (stent had been pulled back during deployment likely related to deep inspiration.  A drug-eluting stent was successfully placed through the initial stent to cover the downstream anterior, using a STENT RESOLUTE ONYX 2.25X15. The stent balloon was used to post dilate the overlapping segment. The proximal 4/5 of the stent was postdilated to 2.8 mm.  A third drug-eluting stent was successfully placed overlapping proximally to cover the full lesion, using a STENT RESOLUTE ONYX 3.0X12. Postdilated to 3.6 mm  Post intervention, there is a 0% residual stenosis throughout the entire 3 stented segment. Sequentially increasing noncompliant balloons sizes were used starting at 2.75 and gradually up to 3.5 mm to post dilate not just the proximal stent, pulse the entire mid 38 mm stent.  -------------------------------  2nd Vessel lesion: 2nd Mrg-1 lesion is 90% stenosed. Plan was to proceed with PCI.  A drug-eluting stent was successfully placed using a STENT RESOLUTE ONYX  2.0X12.  Post intervention, there is a 0% residual stenosis.  ---------------------------------  3rd Vesssel lesion: RPDA lesion is 100% stenosed with the distal segment somewhat filled via left to right collaterals. This was felt to be CTO & the plan is to treat medically.  ---------------------------------  There is moderate left ventricular systolic dysfunction. The left ventricular ejection fraction is 35-45% by visual estimate.  LV end diastolic pressure is moderately elevated -22 mm   Severe Three-Vessel Disease:  Culprit lesion 100% LAD after SP1 (long lesion); 90% proximal OM 2 (with moderate LPL 160%), likely 100 and CTO of RPDA (high bifurcation, fills via left to right collateral flow) ? Successful DES PCI of extensive proximal to mid LAD using 3 overlapping Resolute Onyx DES stents (3.0 mm x 12 mm, 2.5 mm x 38 mm, and 2.25 mm x 15 mm -> entire segment postdilated from 3.65 mm down to 2.8 mm) ? EKG: Still denies of depressions but immediately of this of the EKG you are you can email he can successful stenting of proximal OM 2 with resolute Onyx DES 2.0 mm x 12 mm--postdilated to 2.2 mm)  Moderate severely reduced EF estimated as 35 to 40% with mid to apical anterior hypo to akinesis = ISCHEMIC CARDIOMYOPATHY and ACUTE SYSTOLIC HEART FAILURE  Moderate elevated LVEDP of 22 mm.  ACUTE DIASTOLIC HEART FAILURE   RECOMMENDATIONS:  Admit to CVICU, continue Aggrastat until current bottle complete.    Continue to treat occluded (CTO) of RPDA medically for now.  Would wait until 4/15 or 4/16 to check 2D echo.  Borderline pressures in the Cath Lab -> recommend low-dose beta-blocker plus or minus ARB if possible to start prior to discharge.  Monitor for signs of orthopnea, low threshold to consider IV  diuresis given LVEDP of 22 mmHg.  Given the size of his infarct, and two-vessel PCI, would probably not consider fast-track candidate.   [name], MD   Coronary  Diagrams  Diagnostic Dominance: Right  Intervention   Implants     Patient Profile     54 y.o. married Caucasian male without prior cardiac history or risk factors who presented yesterday with anterior STEMI.  He was taken to the Cath Lab and underwent PCI and drug-eluting stenting x4 of the LAD which was the "culprit vessel" and proximal OM 2.  He also demonstrated an occluded distal RCA with left-to-right collaterals and severe LV dysfunction.  The patient is pain-free and stable this morning on DAPT, high-dose statin and low-dose beta-blocker.  Assessment & Plan    1: CAD-postop day 1 anterior STEMI treated with PCI drug-eluting stenting of the proximal mid LAD with 3 overlapping drug-eluting stents.  He also had PCI and drug-eluting stenting of the proximal portion of OM 2 which had high-grade disease.  He has residual occluded dominant RCA distally before the PDA takeoff with left to right collaterals and severe LV dysfunction.  He is on aspirin and Brilinta.  He is currently asymptomatic.  2: Ischemic cardiomyopathy-EF by left-ventricular V appear to be in the 30% range.  2D echo is pending.  He was on metoprolol which we will switch to low-dose carvedilol.  Given his relatively soft blood pressures going to hold off on starting an ARB and/or Entresto.  He appears euvolemic this morning with clear lungs.  I do not think he needs to be started on Lasix but may benefit from spironolactone.  If his EF is less than 35% he would be a candidate for a "LifeVest".  3: Hyperlipidemia-LDL is 126 currently on high-dose statin therapy.  Postop day 1 large anterior STEMI.  We will keep in 2H today for observation and transfer to telemetry tomorrow morning for ambulation and adjustment of his medications as well as consideration of placement of a LifeVest.  I anticipate discharge on Saturday.  For questions or updates, please contact [facility] Please consult [email or web address] for contact info  under        Signed, Nanetta Batty, MD  05/30/2019, 8:24 AM

## 2019-05-30 NOTE — Progress Notes (Signed)
Discussed MI, stents, Brilinta, restrictions, Lifevest, and CRPII with pt and wife. Receptive, asking appropriate questions. Will f/u tomorrow for more education and ambulation. [id number] Ethelda Chick CES, ACSM 2:23 PM 05/30/2019

## 2019-05-31 DIAGNOSIS — I255 Ischemic cardiomyopathy: Secondary | ICD-10-CM | POA: Diagnosis not present

## 2019-05-31 DIAGNOSIS — E785 Hyperlipidemia, unspecified: Secondary | ICD-10-CM | POA: Diagnosis not present

## 2019-05-31 DIAGNOSIS — I2109 ST elevation (STEMI) myocardial infarction involving other coronary artery of anterior wall: Secondary | ICD-10-CM | POA: Diagnosis not present

## 2019-05-31 LAB — BASIC METABOLIC PANEL
Anion gap: 10 (ref 5–15)
BUN: 15 mg/dL (ref 6–20)
CO2: 26 mmol/L (ref 22–32)
Calcium: 9 mg/dL (ref 8.9–10.3)
Chloride: 103 mmol/L (ref 98–111)
Creatinine, Ser: 1.13 mg/dL (ref 0.61–1.24)
GFR calc Af Amer: >60 mL/min (ref >60)
GFR calc non Af Amer: >60 mL/min (ref >60)
Glucose, Bld: 95 mg/dL (ref 70–99)
Potassium: 3.5 mmol/L (ref 3.5–5.1)
Sodium: 139 mmol/L (ref 135–145)

## 2019-05-31 LAB — MAGNESIUM: Magnesium: 2.1 mg/dL (ref 1.7–2.4)

## 2019-05-31 MED ORDER — ATORVASTATIN CALCIUM 80 MG PO TABS: 80.0000 mg | ORAL_TABLET | Freq: Every day | ORAL | 6 refills | Status: DC

## 2019-05-31 MED ORDER — TICAGRELOR 90 MG PO TABS: 90.0000 mg | ORAL_TABLET | Freq: Two times a day (BID) | ORAL | 11 refills | Status: DC

## 2019-05-31 MED ORDER — ASPIRIN 81 MG PO CHEW: 81.0000 mg | CHEWABLE_TABLET | Freq: Every day | ORAL | 6 refills | Status: DC

## 2019-05-31 MED ORDER — POTASSIUM CHLORIDE CRYS ER 20 MEQ PO TBCR
40.0000 meq | EXTENDED_RELEASE_TABLET | Freq: Once | ORAL | Status: AC
  Administered 2019-05-31: 40 meq via ORAL
  Filled 2019-05-31: qty 2

## 2019-05-31 MED FILL — ASPIRIN LOW DOSE 81 MG CHEW: 81 | 30 days supply | Qty: 30 | Fill #0

## 2019-05-31 MED FILL — BRILINTA 90 MG TABLET: 90 | 30 days supply | Qty: 60 | Fill #0

## 2019-05-31 MED FILL — ATORVASTATIN CALCIUM 80 MG: 80 | 30 days supply | Qty: 30 | Fill #0

## 2019-05-31 NOTE — Progress Notes (Signed)
Progress Note  Patient Name: [name] [name] Date of Encounter: 05/31/2019  Primary Cardiologist: [name], MD   Subjective   Postop day #2 large anterior STEMI treated with PCI and drug-eluting stenting using 3 stents in the proximal mid LAD with stent in OM 2 and an occluded RCA.  He has severe LV dysfunction.  He is currently pain-free.  Inpatient Medications    Scheduled Meds: . aspirin  81 mg Oral Daily  . atorvastatin  80 mg Oral q1800  . carvedilol  6.25 mg Oral BID WC  . Chlorhexidine Gluconate Cloth  6 each Topical Daily  . enoxaparin (LOVENOX) injection  40 mg Subcutaneous Q24H  . potassium chloride  40 mEq Oral Once  . sodium chloride flush  3 mL Intravenous Q12H  . ticagrelor  90 mg Oral BID   Continuous Infusions: . sodium chloride     PRN Meds: sodium chloride, acetaminophen, ALPRAZolam, nitroGLYCERIN, ondansetron (ZOFRAN) IV, sodium chloride flush, zolpidem   Vital Signs    Vitals:   05/31/19 0600 05/31/19 0700 05/31/19 0759 05/31/19 0800  BP: 106/86 114/89  102/79  Pulse: 80 95 90 89  Resp: 16 18 15 15   Temp:   98.5 F (36.9 C)   TempSrc:   Oral   SpO2: 98% 98% 97% 97%  Weight:      Height:        Intake/Output Summary (Last 24 hours) at 05/31/2019 0817 Last data filed at 05/31/2019 0748 Gross per 24 hour  Intake 60 ml  Output 1025 ml  Net -965 ml   Last 3 Weights 05/30/2019 05/29/2019 05/29/2019  Weight (lbs) 183 lb 3.2 oz 181 lb 189 lb  Weight (kg) 83.1 kg 82.1 kg 85.73 kg      Telemetry    Sinus rhythm- Personally Reviewed  ECG    Normal sinus rhythm 88 with septal Q waves- Personally Reviewed  Physical Exam   GEN: No acute distress.   Neck: No JVD Cardiac: RRR, no murmurs, rubs, or gallops.  Respiratory: Clear to auscultation bilaterally. GI: Soft, nontender, non-distended  MS: No edema; No deformity. Neuro:  Nonfocal  Psych: Normal affect   Labs    High Sensitivity Troponin:   Recent Labs  Lab 05/29/19 0934  05/29/19 1209 05/29/19 1359 05/29/19 1546 05/29/19 1952  TROPONINIHS 3,656* >27,000* >27,000* >27,000* >27,000*      Chemistry Recent Labs  Lab 05/29/19 0934 05/29/19 0934 05/29/19 1209 05/30/19 0216 05/31/19 0209  NA 136  --   --  139 139  K 3.5  --   --  3.5 3.5  CL 105  --   --  101 103  CO2 19*  --   --  27 26  GLUCOSE 127*  --   --  113* 95  BUN 8  --   --  12 15  CREATININE 0.94   < > 1.00 1.25* 1.13  CALCIUM 8.8*  --   --  9.0 9.0  PROT 6.3*  --   --   --   --   ALBUMIN 3.7  --   --   --   --   AST 89*  --   --   --   --   ALT 36  --   --   --   --   ALKPHOS 77  --   --   --   --   BILITOT 0.7  --   --   --   --  GFRNONAA >60   < > >60 >60 >60  GFRAA >60   < > >60 >60 >60  ANIONGAP 12  --   --  11 10   < > = values in this interval not displayed.     Hematology Recent Labs  Lab 05/29/19 0934 05/29/19 1211 05/30/19 0216  WBC 12.7* 16.4* 15.5*  RBC 4.91 4.99 4.95  HGB 15.6 15.9 15.5  HCT 43.4 44.5 44.3  MCV 88.4 89.2 89.5  MCH 31.8 31.9 31.3  MCHC 35.9 35.7 35.0  RDW 11.5 11.8 11.9  PLT 279 301 281    BNPNo results for input(s): BNP, PROBNP in the last 168 hours.   DDimer No results for input(s): DDIMER in the last 168 hours.   Radiology    CARDIAC CATHETERIZATION  Result Date: 05/29/2019  CULPRIT LESION prox LAD to Dist LAD lesion is 100% stenosed.  Initially, a drug-eluting stent was successfully placed using a STENT RESOLUTE ONYX 2.5X38. (Eventually postdilated to 3.5 mm proximal and 3.1 mm distal)  Following initial stent placement, there was a distal edge tear focal 90% stenosis (stent had been pulled back during deployment likely related to deep inspiration.  A drug-eluting stent was successfully placed through the initial stent to cover the downstream anterior, using a STENT RESOLUTE ONYX 2.25X15. The stent balloon was used to post dilate the overlapping segment. The proximal 4/5 of the stent was postdilated to 2.8 mm.  A third  drug-eluting stent was successfully placed overlapping proximally to cover the full lesion, using a STENT RESOLUTE ONYX 3.0X12. Postdilated to 3.6 mm  Post intervention, there is a 0% residual stenosis throughout the entire 3 stented segment. Sequentially increasing noncompliant balloons sizes were used starting at 2.75 and gradually up to 3.5 mm to post dilate not just the proximal stent, pulse the entire mid 38 mm stent.  -------------------------------  2nd Vessel lesion: 2nd Mrg-1 lesion is 90% stenosed. Plan was to proceed with PCI.  A drug-eluting stent was successfully placed using a STENT RESOLUTE ONYX 2.0X12.  Post intervention, there is a 0% residual stenosis.  ---------------------------------  3rd Vesssel lesion: RPDA lesion is 100% stenosed with the distal segment somewhat filled via left to right collaterals. This was felt to be CTO & the plan is to treat medically.  ---------------------------------  There is moderate left ventricular systolic dysfunction. The left ventricular ejection fraction is 35-45% by visual estimate.  LV end diastolic pressure is moderately elevated -22 mm  Severe Three-Vessel Disease:  Culprit lesion 100% LAD after SP1 (long lesion); 90% proximal OM 2 (with moderate LPL 160%), likely 100 and CTO of RPDA (high bifurcation, fills via left to right collateral flow)  Successful DES PCI of extensive proximal to mid LAD using 3 overlapping Resolute Onyx DES stents (3.0 mm x 12 mm, 2.5 mm x 38 mm, and 2.25 mm x 15 mm -> entire segment postdilated from 3.65 mm down to 2.8 mm)  EKG: Still denies of depressions but immediately of this of the EKG you are you can email he can successful stenting of proximal OM 2 with resolute Onyx DES 2.0 mm x 12 mm--postdilated to 2.2 mm)  Moderate severely reduced EF estimated as 35 to 40% with mid to apical anterior hypo to akinesis = ISCHEMIC CARDIOMYOPATHY and ACUTE SYSTOLIC HEART FAILURE  Moderate elevated LVEDP of 22 mm.  ACUTE  DIASTOLIC HEART FAILURE RECOMMENDATIONS:  Admit to CVICU, continue Aggrastat until current bottle complete.   Continue to treat occluded (CTO) of RPDA  medically for now.  Would wait until 4/15 or 4/16 to check 2D echo.  Borderline pressures in the Cath Lab -> recommend low-dose beta-blocker plus or minus ARB if possible to start prior to discharge.  Monitor for signs of orthopnea, low threshold to consider IV diuresis given LVEDP of 22 mmHg. Given the size of his infarct, and two-vessel PCI, would probably not consider fast-track candidate. Bryan Lemmaavid Harding, MD  ECHOCARDIOGRAM COMPLETE  Result Date: 05/30/2019    ECHOCARDIOGRAM REPORT   Patient Name:   [name] [name] Date of Exam: 05/30/2019 Medical Rec #:  [id number]        Height:       67.5 in Accession #:    [id number]       Weight:       183.2 lb Date of Birth:  February 11, 1966        BSA:          1.959 m Patient Age:    54 years         BP:           82/63 mmHg Patient Gender: M                HR:           70 bpm. Exam Location:  Inpatient Procedure: 2D Echo, 3D Echo, Color Doppler, Cardiac Doppler and Strain Analysis Indications:    CAD Native Vessel i25.10  History:        Patient has no prior history of Echocardiogram examinations.                 CHF, STEMI; Risk Factors:Dyslipidemia.  Sonographer:    Irving BurtonEmily Senior RDCS Referring Phys: [phone] DAVID W HARDING IMPRESSIONS  1. Wall motion abnormalities in the proximal LAD territory. This might represent stunning or in infarct. Repeat echocardiogram in 3 months is recommended.  2. Left ventricular ejection fraction, by estimation, is 35 to 40%. The left ventricle has moderately decreased function. The left ventricle demonstrates regional wall motion abnormalities (see scoring diagram/findings for description). There is mild concentric left ventricular hypertrophy. Left ventricular diastolic parameters are consistent with Grade I diastolic dysfunction (impaired relaxation). The average left ventricular global  longitudinal strain is -11.4 %.  3. Right ventricular systolic function is normal. The right ventricular size is normal.  4. The mitral valve is normal in structure. No evidence of mitral valve regurgitation. No evidence of mitral stenosis.  5. The aortic valve is normal in structure. Aortic valve regurgitation is not visualized. No aortic stenosis is present.  6. The inferior vena cava is normal in size with greater than 50% respiratory variability, suggesting right atrial pressure of 3 mmHg. FINDINGS  Left Ventricle: Left ventricular ejection fraction, by estimation, is 35 to 40%. The left ventricle has moderately decreased function. The left ventricle demonstrates regional wall motion abnormalities. The average left ventricular global longitudinal strain is -11.4 %. The left ventricular internal cavity size was normal in size. There is mild concentric left ventricular hypertrophy. Left ventricular diastolic parameters are consistent with Grade I diastolic dysfunction (impaired relaxation). Normal left ventricular filling pressure.  LV Wall Scoring: The entire anterior wall, mid and distal anterior septum, and apex are akinetic. The mid anterolateral segment is normal. Right Ventricle: The right ventricular size is normal. No increase in right ventricular wall thickness. Right ventricular systolic function is normal. Left Atrium: Left atrial size was normal in size. Right Atrium: Right atrial size was normal in size. Pericardium: There is  no evidence of pericardial effusion. Mitral Valve: The mitral valve is normal in structure. Normal mobility of the mitral valve leaflets. No evidence of mitral valve regurgitation. No evidence of mitral valve stenosis. Tricuspid Valve: The tricuspid valve is normal in structure. Tricuspid valve regurgitation is not demonstrated. No evidence of tricuspid stenosis. Aortic Valve: The aortic valve is normal in structure. Aortic valve regurgitation is not visualized. No aortic  stenosis is present. Pulmonic Valve: The pulmonic valve was normal in structure. Pulmonic valve regurgitation is not visualized. No evidence of pulmonic stenosis. Aorta: The aortic root is normal in size and structure. Venous: The inferior vena cava is normal in size with greater than 50% respiratory variability, suggesting right atrial pressure of 3 mmHg. IAS/Shunts: No atrial level shunt detected by color flow Doppler.  LEFT VENTRICLE PLAX 2D LVIDd:         3.30 cm     Diastology LVIDs:         2.90 cm     LV e' lateral:   7.18 cm/s LV PW:         1.20 cm     LV E/e' lateral: 9.2 LV IVS:        1.10 cm     LV e' medial:    5.55 cm/s LVOT diam:     1.80 cm     LV E/e' medial:  11.9 LV SV:         41 LV SV Index:   21          2D Longitudinal Strain LVOT Area:     2.54 cm    2D Strain GLS Avg:     -11.4 %  LV Volumes (MOD) LV vol d, MOD A2C: 73.9 ml LV vol d, MOD A4C: 93.2 ml LV vol s, MOD A2C: 40.8 ml LV vol s, MOD A4C: 49.7 ml LV SV MOD A2C:     33.1 ml LV SV MOD A4C:     93.2 ml LV SV MOD BP:      38.3 ml RIGHT VENTRICLE RV S prime:     10.90 cm/s TAPSE (M-mode): 1.4 cm LEFT ATRIUM             Index       RIGHT ATRIUM           Index LA diam:        3.10 cm 1.58 cm/m  RA Area:     11.30 cm LA Vol (A2C):   32.1 ml 16.39 ml/m RA Volume:   23.50 ml  12.00 ml/m LA Vol (A4C):   45.5 ml 23.23 ml/m LA Biplane Vol: 39.5 ml 20.16 ml/m  AORTIC VALVE LVOT Vmax:   92.00 cm/s LVOT Vmean:  62.400 cm/s LVOT VTI:    0.162 m  AORTA Ao Root diam: 3.30 cm Ao Asc diam:  3.50 cm MITRAL VALVE MV Area (PHT): 3.42 cm    SHUNTS MV Decel Time: 222 msec    Systemic VTI:  0.16 m MV E velocity: 66.00 cm/s  Systemic Diam: 1.80 cm MV A velocity: 75.40 cm/s MV E/A ratio:  0.88 Tobias Alexander MD Electronically signed by Tobias Alexander MD Signature Date/Time: 05/30/2019/7:59:45 PM    Final     Cardiac Studies   Cardiac catheterization/PCI drug-eluting stenting (05/29/2019)  Conclusion    CULPRIT LESION prox LAD to Dist LAD  lesion is 100% stenosed.  Initially, a drug-eluting stent was successfully placed using a STENT RESOLUTE ONYX 2.5X38. (Eventually postdilated to 3.5  mm proximal and 3.1 mm distal)  Following initial stent placement, there was a distal edge tear focal 90% stenosis (stent had been pulled back during deployment likely related to deep inspiration.  A drug-eluting stent was successfully placed through the initial stent to cover the downstream anterior, using a [place] 2.25X15. The stent balloon was used to post dilate the overlapping segment. The proximal 4/5 of the stent was postdilated to 2.8 mm.  A third drug-eluting stent was successfully placed overlapping proximally to cover the full lesion, using a STENT RESOLUTE ONYX 3.0X12. Postdilated to 3.6 mm  Post intervention, there is a 0% residual stenosis throughout the entire 3 stented segment. Sequentially increasing noncompliant balloons sizes were used starting at 2.75 and gradually up to 3.5 mm to post dilate not just the proximal stent, pulse the entire mid 38 mm stent.  -------------------------------  2nd Vessel lesion: 2nd Mrg-1 lesion is 90% stenosed. Plan was to proceed with PCI.  A drug-eluting stent was successfully placed using a STENT RESOLUTE ONYX 2.0X12.  Post intervention, there is a 0% residual stenosis.  ---------------------------------  3rd Vesssel lesion: RPDA lesion is 100% stenosed with the distal segment somewhat filled via left to right collaterals. This was felt to be CTO & the plan is to treat medically.  ---------------------------------  There is moderate left ventricular systolic dysfunction. The left ventricular ejection fraction is 35-45% by visual estimate.  LV end diastolic pressure is moderately elevated -22 mm   Severe Three-Vessel Disease:  Culprit lesion 100% LAD after SP1 (long lesion); 90% proximal OM 2 (with moderate LPL 160%), likely 100 and CTO of RPDA (high bifurcation, fills via  left to right collateral flow) ? Successful DES PCI of extensive proximal to mid LAD using 3 overlapping Resolute Onyx DES stents (3.0 mm x 12 mm, 2.5 mm x 38 mm, and 2.25 mm x 15 mm -> entire segment postdilated from 3.65 mm down to 2.8 mm) ? EKG: Still denies of depressions but immediately of this of the EKG you are you can email he can successful stenting of proximal OM 2 with resolute Onyx DES 2.0 mm x 12 mm--postdilated to 2.2 mm)  Moderate severely reduced EF estimated as 35 to 40% with mid to apical anterior hypo to akinesis = ISCHEMIC CARDIOMYOPATHY and ACUTE SYSTOLIC HEART FAILURE  Moderate elevated LVEDP of 22 mm.  ACUTE DIASTOLIC HEART FAILURE   RECOMMENDATIONS:  Admit to CVICU, continue Aggrastat until current bottle complete.    Continue to treat occluded (CTO) of RPDA medically for now.  Would wait until 4/15 or 4/16 to check 2D echo.  Borderline pressures in the Cath Lab -> recommend low-dose beta-blocker plus or minus ARB if possible to start prior to discharge.  Monitor for signs of orthopnea, low threshold to consider IV diuresis given LVEDP of 22 mmHg.  Given the size of his infarct, and two-vessel PCI, would probably not consider fast-track candidate.   Glenetta Hew, MD   Coronary Diagrams  Diagnostic Dominance: Right  Intervention   Implants    2D echocardiogram (05/30/2019)  IMPRESSIONS    1. Wall motion abnormalities in the proximal LAD territory. This might  represent stunning or in infarct. Repeat echocardiogram in 3 months is  recommended.  2. Left ventricular ejection fraction, by estimation, is 35 to 40%. The  left ventricle has moderately decreased function. The left ventricle  demonstrates regional wall motion abnormalities (see scoring  diagram/findings for description). There is mild  concentric left ventricular hypertrophy. Left  ventricular diastolic  parameters are consistent with Grade I diastolic dysfunction (impaired    relaxation). The average left ventricular global longitudinal strain is  -11.4 %.  3. Right ventricular systolic function is normal. The right ventricular  size is normal.  4. The mitral valve is normal in structure. No evidence of mitral valve  regurgitation. No evidence of mitral stenosis.  5. The aortic valve is normal in structure. Aortic valve regurgitation is  not visualized. No aortic stenosis is present.  6. The inferior vena cava is normal in size with greater than 50%  respiratory variability, suggesting right atrial pressure of 3 mmHg.   Patient Profile     54 y.o. married Caucasian male without prior cardiac history or risk factors who presented yesterday with anterior STEMI.  He was taken to the Cath Lab and underwent PCI and drug-eluting stenting x4 of the LAD which was the "culprit vessel" and proximal OM 2.  He also demonstrated an occluded distal RCA with left-to-right collaterals and severe LV dysfunction.  The patient is pain-free and stable this morning on DAPT, high-dose statin and low-dose beta-blocker.  Assessment & Plan    1: CAD-postop day #2 anterior STEMI treated with PCI drug-eluting stenting of the proximal mid LAD with 3 overlapping drug-eluting stents.  He also had PCI and drug-eluting stenting of the proximal portion of OM 2 which had high-grade disease.  He has residual occluded dominant RCA distally before the PDA takeoff with left to right collaterals and severe LV dysfunction.  He is on aspirin and Brilinta.  He is currently asymptomatic.  2: Ischemic cardiomyopathy-EF by left-ventricular V appear to be in the 30% range.  2D revealed EF in the 35 to 40% range.Marland Kitchen  He was on metoprolol which we will switch to low-dose carvedilol.  Given his relatively soft blood pressures going to hold off on starting an ARB and/or Entresto.  He appears euvolemic this morning with clear lungs.  I do not think he needs to be started on Lasix but may benefit from  spironolactone.   3: Hyperlipidemia-LDL is 126 currently on high-dose statin therapy.  Postop day #2 large anterior STEMI.  We will keep in 2H today for observation and transfer to telemetry tomorrow morning for ambulation and adjustment of his medications .  Plan transfer to telemetry bed today.  I anticipate discharge on Saturday.  For questions or updates, please contact [facility] Please consult [email or web address] for contact info under        Signed, Nanetta Batty, MD  05/31/2019, 8:17 AM

## 2019-05-31 NOTE — Progress Notes (Signed)
CARDIAC REHAB PHASE I   PRE:  Rate/Rhythm: 83 SR    BP: sitting 102/74    SaO2: 95 RA  MODE:  Ambulation: 370 ft   POST:  Rate/Rhythm: 90 SR    BP: sitting 101/72     SaO2: 99 RA  Tolerated well, no c/o. Discussed diet, exercise, NTG, Brilinta (reviewed), and CRPII. Pt and wife receptive. Will refer to G'sO CRPII.  Pt is interested in participating in Virtual Cardiac and Pulmonary Rehab. Pt advised that Virtual Cardiac and Pulmonary Rehab is provided at no cost to the patient.  Checklist:  1. Pt has smart device  ie smartphone and/or ipad for downloading an app  Yes 2. Reliable internet/wifi service    Yes 3. Understands how to use their smartphone and navigate within an app.  Yes Pt verbalized understanding and is in agreement.  [id number]   [name] [name] CES, ACSM 05/31/2019 12:03 PM

## 2019-05-31 NOTE — Plan of Care (Signed)
  Problem: Education: Goal: Understanding of CV disease, CV risk reduction, and recovery process will improve Outcome: Progressing   Problem: Cardiovascular: Goal: Ability to achieve and maintain adequate cardiovascular perfusion will improve Outcome: Progressing   Problem: Cardiovascular: Goal: Vascular access site(s) Level 0-1 will be maintained Outcome: Progressing   

## 2019-06-01 DIAGNOSIS — D72829 Elevated white blood cell count, unspecified: Secondary | ICD-10-CM

## 2019-06-01 DIAGNOSIS — I2109 ST elevation (STEMI) myocardial infarction involving other coronary artery of anterior wall: Secondary | ICD-10-CM | POA: Diagnosis not present

## 2019-06-01 LAB — BASIC METABOLIC PANEL
Anion gap: 9 (ref 5–15)
BUN: 17 mg/dL (ref 6–20)
CO2: 24 mmol/L (ref 22–32)
Calcium: 8.9 mg/dL (ref 8.9–10.3)
Chloride: 106 mmol/L (ref 98–111)
Creatinine, Ser: 1.14 mg/dL (ref 0.61–1.24)
GFR calc Af Amer: >60 mL/min (ref >60)
GFR calc non Af Amer: >60 mL/min (ref >60)
Glucose, Bld: 101 mg/dL — ABNORMAL HIGH (ref 70–99)
Potassium: 3.8 mmol/L (ref 3.5–5.1)
Sodium: 139 mmol/L (ref 135–145)

## 2019-06-01 MED ORDER — LOSARTAN POTASSIUM 25 MG PO TABS: 12.5000 mg | ORAL_TABLET | Freq: Every day | ORAL | 6 refills | Status: DC

## 2019-06-01 MED ORDER — NITROGLYCERIN 0.4 MG SL SUBL: 0.4000 mg | SUBLINGUAL_TABLET | SUBLINGUAL | 3 refills | Status: DC | PRN

## 2019-06-01 MED ORDER — CARVEDILOL 6.25 MG PO TABS: 6.2500 mg | ORAL_TABLET | Freq: Two times a day (BID) | ORAL | 6 refills | Status: DC

## 2019-06-01 MED ORDER — LOSARTAN POTASSIUM 25 MG PO TABS: 12.5000 mg | ORAL_TABLET | Freq: Every day | ORAL | Status: DC

## 2019-06-01 NOTE — Discharge Instructions (Signed)
Ticagrelor oral tablet What is this medicine? TICAGRELOR (TYE ka GREL or) helps to prevent blood clots. This medicine is used to prevent heart attack, stroke, or other vascular events in people who have had a recent heart attack or who have severe chest pain. It is also used to lower the chance of stroke or heart attack in people with a medical condition called coronary artery disease. This medicine may be used for other purposes; ask your health care provider or pharmacist if you have questions. COMMON BRAND NAME(S): BRILINTA What should I tell my health care provider before I take this medicine? They need to know if you have any of these conditions:  bleeding disorders  bleeding in the brain  having surgery  history of irregular heartbeat  history of stomach bleeding  liver disease  an unusual or allergic reaction to ticagrelor, other medicines, foods, dyes, or preservatives  pregnant or trying to get pregnant  breast-feeding How should I use this medicine? Take this medicine by mouth with a glass of water. Follow the directions on the prescription label. You can take it with or without food. If it upsets your stomach, take it with food. Take your medicine at regular intervals. Do not take it more often than directed. Do not stop taking except on your doctor's advice. A special MedGuide will be given to you by the pharmacist with each prescription and refill. Be sure to read this information carefully each time. Talk to your pediatrician regarding the use of this medicine in children. Special care may be needed. Overdosage: If you think you have taken too much of this medicine contact a poison control center or emergency room at once. NOTE: This medicine is only for you. Do not share this medicine with others. What if I miss a dose? If you miss a dose, take it as soon as you can. If it is almost time for your next dose, take only that dose. Do not take double or extra doses. What  may interact with this medicine? Do not take this medicine with any of the following medications:  defibrotide  itraconazole This medicine may also interact with the following medications:  aspirin  certain antibiotics like clarithromycin, telithromycin, and rifampin  certain antiviral medicines for HIV or AIDS like atazanavir, indinavir, nelfinavir, ritonavir, and saquinavir  certain medicines for cholesterol like lovastatin and simvastatin  certain medicines for fungal infections like ketoconazole and voriconazole  certain medicines for seizures like carbamazepine, phenobarbital, and phenytoin  digoxin  narcotic medicines for pain  nefazodone This list may not describe all possible interactions. Give your health care provider a list of all the medicines, herbs, non-prescription drugs, or dietary supplements you use. Also tell them if you smoke, drink alcohol, or use illegal drugs. Some items may interact with your medicine. What should I watch for while using this medicine? Visit your healthcare professional for regular checks on your progress. Your condition will be monitored carefully while you are receiving this medicine. It is important not to miss any appointments. Notify your doctor or health care professional and seek emergency treatment if you develop breathing problems; changes in vision; chest pain; severe, sudden headache; pain, swelling, warmth in the leg; trouble speaking; sudden numbness or weakness of the face, arm, or leg. These can be signs that your condition has gotten worse. If you are going to have surgery or dental work, tell your doctor or health care professional that you are taking this medicine. You should take aspirin every   day with this medicine. Do not take more than 100 mg each day. Talk to your healthcare professional if you have questions. What side effects may I notice from receiving this medicine? Side effects that you should report to your doctor  or health care professional as soon as possible:  allergic reactions like skin rash, itching or hives, swelling of the face, lips, or tongue  breathing problems  fast or irregular heartbeat  feeling faint or light-headed, falls  signs and symptoms of bleeding such as bloody or black, tarry stools; red or dark-brown urine; spitting up blood or brown material that looks like coffee grounds; red spots on the skin; unusual bruising or bleeding from the eye, gums, or nose Side effects that usually do not require medical attention (report to your doctor or health care professional if they continue or are bothersome):  breast enlargement in both males and females  diarrhea  dizziness  headache  tiredness  upset stomach This list may not describe all possible side effects. Call your doctor for medical advice about side effects. You may report side effects to FDA at 1-800-FDA-1088. Where should I keep my medicine? Keep out of the reach of children. Store at room temperature of 59 to 86 degrees F (15 to 30 degrees C). Throw away any unused medicine after the expiration date. NOTE: This sheet is a summary. It may not cover all possible information. If you have questions about this medicine, talk to your doctor, pharmacist, or health care provider.  2020 [employer] Standard (2018-07-16 21:21:41)  

## 2019-06-01 NOTE — Progress Notes (Signed)
Medication List for Short Term Disability Form  TAKE these medications   aspirin 81 MG chewable tablet Chew 1 tablet (81 mg total) by mouth daily.   atorvastatin 80 MG tablet Commonly known as: LIPITOR Take 1 tablet (80 mg total) by mouth daily at 6 PM.   carvedilol 6.25 MG tablet Commonly known as: COREG Take 1 tablet (6.25 mg total) by mouth 2 (two) times daily with a meal.   cetirizine 10 MG tablet Commonly known as: ZYRTEC Take 10 mg by mouth daily.   losartan 25 MG tablet Commonly known as: COZAAR Take 0.5 tablets (12.5 mg total) by mouth daily.   nitroGLYCERIN 0.4 MG SL tablet Commonly known as: NITROSTAT Place 1 tablet (0.4 mg total) under the tongue every 5 (five) minutes as needed for chest pain (up to 3 doses. If taking 3rd dose, call 911.).   ranitidine 150 MG capsule Commonly known as: ZANTAC Take 150 mg by mouth daily as needed for heartburn.   simethicone 125 MG chewable tablet Commonly known as: MYLICON Chew 125 mg by mouth every 6 (six) hours as needed for flatulence.   ticagrelor 90 MG Tabs tablet Commonly known as: BRILINTA Take 1 tablet (90 mg total) by mouth 2 (two) times daily.

## 2019-06-01 NOTE — Care Management (Signed)
Patient's Brilinta was filled by [facility] pharmacy.  Patient given copay card for future use.

## 2019-06-01 NOTE — Discharge Summary (Addendum)
Discharge Summary    Patient ID: [name] [name] MRN: [id number]; DOB: 1965-07-02  Admit date: 05/29/2019 Discharge date: 06/01/2019  Primary Care Provider: No primary care provider on file.  Primary Cardiologist: [name] Lemma, MD  Primary Electrophysiologist:  None   Discharge Diagnoses    Principal Problem:   Acute ST elevation myocardial infarction (STEMI) of anterior wall [facility]) Active Problems:   Hyperlipidemia LDL goal <70   Coronary artery disease involving native coronary artery of native heart with unstable angina pectoris (HCC)   Acute combined systolic and diastolic heart failure (HCC)   Ischemic cardiomyopathy   Leukocytosis   Diagnostic Studies/Procedures    LHC 05/29/19 Conclusion    CULPRIT LESION prox LAD to Dist LAD lesion is 100% stenosed.  Initially, a drug-eluting stent was successfully placed using a STENT RESOLUTE ONYX 2.5X38. (Eventually postdilated to 3.5 mm proximal and 3.1 mm distal)  Following initial stent placement, there was a distal edge tear focal 90% stenosis (stent had been pulled back during deployment likely related to deep inspiration.  A drug-eluting stent was successfully placed through the initial stent to cover the downstream anterior, using a STENT RESOLUTE ONYX 2.25X15. The stent balloon was used to post dilate the overlapping segment. The proximal 4/5 of the stent was postdilated to 2.8 mm.  A third drug-eluting stent was successfully placed overlapping proximally to cover the full lesion, using a STENT RESOLUTE ONYX 3.0X12. Postdilated to 3.6 mm  Post intervention, there is a 0% residual stenosis throughout the entire 3 stented segment. Sequentially increasing noncompliant balloons sizes were used starting at 2.75 and gradually up to 3.5 mm to post dilate not just the proximal stent, pulse the entire mid 38 mm stent.  -------------------------------  2nd Vessel lesion: 2nd Mrg-1 lesion is 90% stenosed. Plan was to proceed with  PCI.  A drug-eluting stent was successfully placed using a STENT RESOLUTE ONYX 2.0X12.  Post intervention, there is a 0% residual stenosis.  ---------------------------------  3rd Vesssel lesion: RPDA lesion is 100% stenosed with the distal segment somewhat filled via left to right collaterals. This was felt to be CTO & the plan is to treat medically.  ---------------------------------  There is moderate left ventricular systolic dysfunction. The left ventricular ejection fraction is 35-45% by visual estimate.  LV end diastolic pressure is moderately elevated -22 mm   Severe Three-Vessel Disease:  Culprit lesion 100% LAD after SP1 (long lesion); 90% proximal OM 2 (with moderate LPL 160%), likely 100 and CTO of RPDA (high bifurcation, fills via left to right collateral flow) ? Successful DES PCI of extensive proximal to mid LAD using 3 overlapping Resolute Onyx DES stents (3.0 mm x 12 mm, 2.5 mm x 38 mm, and 2.25 mm x 15 mm -> entire segment postdilated from 3.65 mm down to 2.8 mm) ? EKG: Still denies of depressions but immediately of this of the EKG you are you can email he can successful stenting of proximal OM 2 with resolute Onyx DES 2.0 mm x 12 mm--postdilated to 2.2 mm)  Moderate severely reduced EF estimated as 35 to 40% with mid to apical anterior hypo to akinesis = ISCHEMIC CARDIOMYOPATHY and ACUTE SYSTOLIC HEART FAILURE  Moderate elevated LVEDP of 22 mm.  ACUTE DIASTOLIC HEART FAILURE   RECOMMENDATIONS:  Admit to CVICU, continue Aggrastat until current bottle complete.    Continue to treat occluded (CTO) of RPDA medically for now.  Would wait until 4/15 or 4/16 to check 2D echo.  Borderline pressures in the Cath  Lab -> recommend low-dose beta-blocker plus or minus ARB if possible to start prior to discharge.  Monitor for signs of orthopnea, low threshold to consider IV diuresis given LVEDP of 22 mmHg.  Given the size of his infarct, and two-vessel PCI, would  probably not consider fast-track candidate.   [name] Hew, MD   2D Echo 05/30/19 1. Wall motion abnormalities in the proximal LAD territory. This might  represent stunning or in infarct. Repeat echocardiogram in 3 months is  recommended.  2. Left ventricular ejection fraction, by estimation, is 35 to 40%. The  left ventricle has moderately decreased function. The left ventricle  demonstrates regional wall motion abnormalities (see scoring  diagram/findings for description). There is mild  concentric left ventricular hypertrophy. Left ventricular diastolic  parameters are consistent with Grade I diastolic dysfunction (impaired  relaxation). The average left ventricular global longitudinal strain is  -11.4 %.  3. Right ventricular systolic function is normal. The right ventricular  size is normal.  4. The mitral valve is normal in structure. No evidence of mitral valve  regurgitation. No evidence of mitral stenosis.  5. The aortic valve is normal in structure. Aortic valve regurgitation is  not visualized. No aortic stenosis is present.  6. The inferior vena cava is normal in size with greater than 50%  respiratory variability, suggesting right atrial pressure of 3 mmHg.    _____________   History of Present Illness     [name] [name] is a 54 y.o. male with HLD who presented to [facility] with chest pain and was found to have an anterior STEMI.  He does not seek care regularly. [name] [name] had a checkup a couple of years ago. At that time, he was told his cholesterol was high. Blood pressure and blood sugar were reported to be okay. He does not smoke or drink. His wife reported that they eat a lot of fast food. There was no family history of premature coronary artery disease. He has been having occasional symptoms of chest pain that radiated to his left arm with some numbness for a couple of years. He has never been evaluated for these.  About 2 days prior to  admission, he developed chest pain. He described it as indigestion and a pressure. He could not lie down or sleep. He tried multiple antacids without relief. Finally, today, he went to urgent care. His ECG was consistent with an anterior STEMI. He was given ASA 324 mg and EMS was called. EMS gave him 3 sublingual nitroglycerin in route to the hospital. His ECG was significantly abnormal on arrival to the hospital and he was taken emergently to the Cath Lab. Covid test was negative.  Hospital Course     1. CAD/STEMI - cath findings as outlined above. Culprit lesion was 100% prox-distal LAD. Initially a DES was placed. Following initial stent placement, there was a distal edge tear focal 90% stenosis (stent had been pulled back during deployment likely related to deep inspiration. Two more stents were deployed to overlap this area. He also had a 90% OM2 which was treated with a drug-eluting stent. There was a residual 100% RPDA lesion stenosed with the distal segment somewhat filled via left to right collaterals. This was felt to be a CTO with recommendation to treat medically. Troponin peaked at >27,000. He was started on standard post MI therapy to include ASA, Brilinta (provided by [facility] pharmacy), statin, and beta blocker.  2. Ischemic cardiomyopathy with acute combined CHF - cardiac  cath showed EF 35-45% with LVEDP of . He had borderline pressures in the cath lab so did not get a dose of diuretic. 2D echo showed EF 35-40% with wall motion abnormalities in the proximal LAD territory. This might represent stunning/infarct. It also showed grade 1 DD, no significant valvular disease, RV normal. He was started on carvedilol and low dose ARB. Dr. Antoine Poche did not feel he required diuretic at discharge. If he is tolerating ARB, would suggest labs at time of 1 week follow-up (CMET instead of BMET, plus CBC - see below). Consider med titration as OP if able. At time of follow-up, recommend to arrange repeat echo  as outpatient after 3 months. Reviewed 2g sodium restriction, 2L fluid restriction, daily weights with patient.  3. Hyperlipidemia - lipid profile showed HDL of 30, LDL 126. He was started on statin. LFTs did show AST of 89 on admission, normal ALT, likely related to MI -would suggest to recheck when he comes in for f/u appt. If the patient is tolerating statin at time of follow-up appointment, would consider rechecking liver function/lipid panel in 6-8 weeks.  4. Leukocytosis - WBC was elevated on admission, trending 12.7-16.4-15.5. This should be rechecked at outpatient visit as well - if still elevated, would consider referral to hematology.  Aspirin, Brilinta and atorvastatin were already sent to the [facility] pharmacy by yesterday's rounding team. We have sent in his additional new prescriptions to his requested pharmacy (losartan, carvedilol and SL NTG). 30 day supply chosen given possible need for med titration as outpatient for LV dysfunction. I have sent a message to our office's scheduling team requesting a follow-up appointment, and our office will call the patient with this information. He was advised to remain out of work until clearance discussed in follow-up. Dr. Antoine Poche has seen and examined the patient today and feels he is stable for discharge.   Did the patient have an acute coronary syndrome (MI, NSTEMI, STEMI, etc) this admission?:  Yes                               AHA/ACC Clinical Performance & Quality Measures: 2. Aspirin prescribed? - Yes 3. ADP Receptor Inhibitor (Plavix/Clopidogrel, Brilinta/Ticagrelor or Effient/Prasugrel) prescribed (includes medically managed patients)? - Yes 4. Beta Blocker prescribed? - Yes 5. High Intensity Statin (Lipitor 40-80mg  or Crestor 20-40mg ) prescribed? - Yes 6. EF assessed during THIS hospitalization? - Yes 7. For EF <40%, was ACEI/ARB prescribed? - Yes 8. For EF <40%, Aldosterone Antagonist (Spironolactone or Eplerenone) prescribed? - No -  Reason:  blood pressure tending to be softer 9. Cardiac Rehab Phase II ordered (Included Medically managed Patients)? - Yes   _____________  Discharge Vitals Blood pressure 110/78, pulse 82, temperature 99.4 F (37.4 C), temperature source Oral, resp. rate 16, height 5' 7.5" (1.715 m), weight 83.1 kg, SpO2 98 %.  Filed Weights   05/29/19 0900 05/29/19 1600 05/30/19 0348  Weight: 85.7 kg 82.1 kg 83.1 kg    Labs & Radiologic Studies    CBC Recent Labs    05/30/19 0216  WBC 15.5*  HGB 15.5  HCT 44.3  MCV 89.5  PLT 281   Basic Metabolic Panel Recent Labs    40/81/44 0209 06/01/19 0557  NA 139 139  K 3.5 3.8  CL 103 106  CO2 26 24  GLUCOSE 95 101*  BUN 15 17  CREATININE 1.13 1.14  CALCIUM 9.0 8.9  MG 2.1  --  High Sensitivity Troponin:   Recent Labs  Lab 05/29/19 0934 05/29/19 1209 05/29/19 1359 05/29/19 1546 05/29/19 1952  TROPONINIHS 3,656* >27,000* >27,000* >27,000* >27,000*    _____________  CARDIAC CATHETERIZATION  Result Date: 05/29/2019  CULPRIT LESION prox LAD to Dist LAD lesion is 100% stenosed.  Initially, a drug-eluting stent was successfully placed using a STENT RESOLUTE ONYX 2.5X38. (Eventually postdilated to 3.5 mm proximal and 3.1 mm distal)  Following initial stent placement, there was a distal edge tear focal 90% stenosis (stent had been pulled back during deployment likely related to deep inspiration.  A drug-eluting stent was successfully placed through the initial stent to cover the downstream anterior, using a STENT RESOLUTE ONYX 2.25X15. The stent balloon was used to post dilate the overlapping segment. The proximal 4/5 of the stent was postdilated to 2.8 mm.  A third drug-eluting stent was successfully placed overlapping proximally to cover the full lesion, using a STENT RESOLUTE ONYX 3.0X12. Postdilated to 3.6 mm  Post intervention, there is a 0% residual stenosis throughout the entire 3 stented segment. Sequentially increasing  noncompliant balloons sizes were used starting at 2.75 and gradually up to 3.5 mm to post dilate not just the proximal stent, pulse the entire mid 38 mm stent.  -------------------------------  2nd Vessel lesion: 2nd Mrg-1 lesion is 90% stenosed. Plan was to proceed with PCI.  A drug-eluting stent was successfully placed using a STENT RESOLUTE ONYX 2.0X12.  Post intervention, there is a 0% residual stenosis.  ---------------------------------  3rd Vesssel lesion: RPDA lesion is 100% stenosed with the distal segment somewhat filled via left to right collaterals. This was felt to be CTO & the plan is to treat medically.  ---------------------------------  There is moderate left ventricular systolic dysfunction. The left ventricular ejection fraction is 35-45% by visual estimate.  LV end diastolic pressure is moderately elevated -22 mm  Severe Three-Vessel Disease:  Culprit lesion 100% LAD after SP1 (long lesion); 90% proximal OM 2 (with moderate LPL 160%), likely 100 and CTO of RPDA (high bifurcation, fills via left to right collateral flow)  Successful DES PCI of extensive proximal to mid LAD using 3 overlapping Resolute Onyx DES stents (3.0 mm x 12 mm, 2.5 mm x 38 mm, and 2.25 mm x 15 mm -> entire segment postdilated from 3.65 mm down to 2.8 mm)  EKG: Still denies of depressions but immediately of this of the EKG you are you can email he can successful stenting of proximal OM 2 with resolute Onyx DES 2.0 mm x 12 mm--postdilated to 2.2 mm)  Moderate severely reduced EF estimated as 35 to 40% with mid to apical anterior hypo to akinesis = ISCHEMIC CARDIOMYOPATHY and ACUTE SYSTOLIC HEART FAILURE  Moderate elevated LVEDP of 22 mm.  ACUTE DIASTOLIC HEART FAILURE RECOMMENDATIONS:  Admit to CVICU, continue Aggrastat until current bottle complete.   Continue to treat occluded (CTO) of RPDA medically for now.  Would wait until 4/15 or 4/16 to check 2D echo.  Borderline pressures in the Cath Lab -> recommend  low-dose beta-blocker plus or minus ARB if possible to start prior to discharge.  Monitor for signs of orthopnea, low threshold to consider IV diuresis given LVEDP of 22 mmHg. Given the size of his infarct, and two-vessel PCI, would probably not consider fast-track candidate. [name] Lemma, MD  ECHOCARDIOGRAM COMPLETE  Result Date: 05/30/2019    ECHOCARDIOGRAM REPORT   Patient Name:   [name] [name] Date of Exam: 05/30/2019 Medical Rec #:  [id number]  Height:       67.5 in Accession #:    [id number]       Weight:       183.2 lb Date of Birth:  April 24, 1965        BSA:          1.959 m Patient Age:    54 years         BP:           82/63 mmHg Patient Gender: M                HR:           70 bpm. Exam Location:  Inpatient Procedure: 2D Echo, 3D Echo, Color Doppler, Cardiac Doppler and Strain Analysis Indications:    CAD Native Vessel i25.10  History:        Patient has no prior history of Echocardiogram examinations.                 CHF, STEMI; Risk Factors:Dyslipidemia.  Sonographer:    Irving BurtonEmily Senior RDCS Referring Phys: [phone] DAVID W HARDING IMPRESSIONS  1. Wall motion abnormalities in the proximal LAD territory. This might represent stunning or in infarct. Repeat echocardiogram in 3 months is recommended.  2. Left ventricular ejection fraction, by estimation, is 35 to 40%. The left ventricle has moderately decreased function. The left ventricle demonstrates regional wall motion abnormalities (see scoring diagram/findings for description). There is mild concentric left ventricular hypertrophy. Left ventricular diastolic parameters are consistent with Grade I diastolic dysfunction (impaired relaxation). The average left ventricular global longitudinal strain is -11.4 %.  3. Right ventricular systolic function is normal. The right ventricular size is normal.  4. The mitral valve is normal in structure. No evidence of mitral valve regurgitation. No evidence of mitral stenosis.  5. The aortic valve is normal in  structure. Aortic valve regurgitation is not visualized. No aortic stenosis is present.  6. The inferior vena cava is normal in size with greater than 50% respiratory variability, suggesting right atrial pressure of 3 mmHg. FINDINGS  Left Ventricle: Left ventricular ejection fraction, by estimation, is 35 to 40%. The left ventricle has moderately decreased function. The left ventricle demonstrates regional wall motion abnormalities. The average left ventricular global longitudinal strain is -11.4 %. The left ventricular internal cavity size was normal in size. There is mild concentric left ventricular hypertrophy. Left ventricular diastolic parameters are consistent with Grade I diastolic dysfunction (impaired relaxation). Normal left ventricular filling pressure.  LV Wall Scoring: The entire anterior wall, mid and distal anterior septum, and apex are akinetic. The mid anterolateral segment is normal. Right Ventricle: The right ventricular size is normal. No increase in right ventricular wall thickness. Right ventricular systolic function is normal. Left Atrium: Left atrial size was normal in size. Right Atrium: Right atrial size was normal in size. Pericardium: There is no evidence of pericardial effusion. Mitral Valve: The mitral valve is normal in structure. Normal mobility of the mitral valve leaflets. No evidence of mitral valve regurgitation. No evidence of mitral valve stenosis. Tricuspid Valve: The tricuspid valve is normal in structure. Tricuspid valve regurgitation is not demonstrated. No evidence of tricuspid stenosis. Aortic Valve: The aortic valve is normal in structure. Aortic valve regurgitation is not visualized. No aortic stenosis is present. Pulmonic Valve: The pulmonic valve was normal in structure. Pulmonic valve regurgitation is not visualized. No evidence of pulmonic stenosis. Aorta: The aortic root is normal in size and structure. Venous: The inferior vena cava  is normal in size with greater  than 50% respiratory variability, suggesting right atrial pressure of 3 mmHg. IAS/Shunts: No atrial level shunt detected by color flow Doppler.  LEFT VENTRICLE PLAX 2D LVIDd:         3.30 cm     Diastology LVIDs:         2.90 cm     LV e' lateral:   7.18 cm/s LV PW:         1.20 cm     LV E/e' lateral: 9.2 LV IVS:        1.10 cm     LV e' medial:    5.55 cm/s LVOT diam:     1.80 cm     LV E/e' medial:  11.9 LV SV:         41 LV SV Index:   21          2D Longitudinal Strain LVOT Area:     2.54 cm    2D Strain GLS Avg:     -11.4 %  LV Volumes (MOD) LV vol d, MOD A2C: 73.9 ml LV vol d, MOD A4C: 93.2 ml LV vol s, MOD A2C: 40.8 ml LV vol s, MOD A4C: 49.7 ml LV SV MOD A2C:     33.1 ml LV SV MOD A4C:     93.2 ml LV SV MOD BP:      38.3 ml RIGHT VENTRICLE RV S prime:     10.90 cm/s TAPSE (M-mode): 1.4 cm LEFT ATRIUM             Index       RIGHT ATRIUM           Index LA diam:        3.10 cm 1.58 cm/m  RA Area:     11.30 cm LA Vol (A2C):   32.1 ml 16.39 ml/m RA Volume:   23.50 ml  12.00 ml/m LA Vol (A4C):   45.5 ml 23.23 ml/m LA Biplane Vol: 39.5 ml 20.16 ml/m  AORTIC VALVE LVOT Vmax:   92.00 cm/s LVOT Vmean:  62.400 cm/s LVOT VTI:    0.162 m  AORTA Ao Root diam: 3.30 cm Ao Asc diam:  3.50 cm MITRAL VALVE MV Area (PHT): 3.42 cm    SHUNTS MV Decel Time: 222 msec    Systemic VTI:  0.16 m MV E velocity: 66.00 cm/s  Systemic Diam: 1.80 cm MV A velocity: 75.40 cm/s MV E/A ratio:  0.88 Tobias Alexander MD Electronically signed by Tobias Alexander MD Signature Date/Time: 05/30/2019/7:59:45 PM    Final    Disposition   Pt is being discharged home today in good condition.  Follow-up Plans & Appointments    Follow-up Information    Marykay Lex, MD Follow up.   Specialty: Cardiology Why: Our office will call you for a follow-up appointment. Please call the office if you have not heard from [place] within 3 days. Contact information: [address] [place] [address] [phone]           Discharge Instructions    Amb Referral to Cardiac Rehabilitation   Complete by: As directed    Diagnosis:  Coronary Stents PTCA STEMI     After initial evaluation and assessments completed: Virtual Based Care may be provided alone or in conjunction with Phase 2 Cardiac Rehab based on patient barriers.: Yes   Diet - low sodium heart healthy   Complete by: As directed    Discharge  instructions   Complete by: As directed    Your aspirin, atorvastatin, and Brilinta were delivered to you prior to leaving the hospital. Your additional prescriptions (losartan, carvedilol and nitroglycerin) [facility] the [facility].  Patients taking blood thinners should generally stay away from medicines like ibuprofen, Advil, Motrin, naproxen, and Aleve due to risk of stomach bleeding. You may take Tylenol as directed or talk to your primary doctor about alternatives.  One of your heart tests showed weakness of the heart muscle this admission. This may make you more susceptible to weight gain from fluid retention, which can lead to symptoms that we call heart failure. Please follow these special instructions:  1. Follow a low-salt diet - you are allowed no more than 2,000mg  of sodium per day. Watch your fluid intake. In general, you should not be taking in more than 2 liters of fluid per day (no more than 8 glasses per day). This includes sources of water in foods like soup, coffee, tea, milk, etc. 2. Weigh yourself on the same scale at same time of day and keep a log. 3. Call your doctor: (Anytime you feel any of the following symptoms)  - 3lb weight gain overnight or 5lb within a few days - Shortness of breath, with or without a dry hacking cough  - Swelling in the hands, feet or stomach  - If you have to sleep on extra pillows at night in order to breathe  IT IS IMPORTANT TO LET YOUR DOCTOR KNOW EARLY ON IF YOU ARE HAVING SYMPTOMS SO WE CAN HELP YOU!   Increase activity slowly   Complete by: As  directed    No driving for 2 weeks. No lifting over 10 lbs for 4 weeks. No sexual activity for 4 weeks. You may not return to work until cleared by your cardiologist. Keep procedure site clean & dry. If you notice increased pain, swelling, bleeding or pus, call/return!  You may shower, but no soaking baths/hot tubs/pools for 1 week.      Discharge Medications   Allergies as of 06/01/2019   No Known Allergies     Medication List    STOP taking these medications   bismuth subsalicylate 262 MG/15ML suspension Commonly known as: PEPTO BISMOL   calcium carbonate 500 MG chewable tablet Commonly known as: TUMS - dosed in mg elemental calcium   naproxen sodium 220 MG tablet Commonly known as: ALEVE     TAKE these medications   aspirin 81 MG chewable tablet Chew 1 tablet (81 mg total) by mouth daily.   atorvastatin 80 MG tablet Commonly known as: LIPITOR Take 1 tablet (80 mg total) by mouth daily at 6 PM.   carvedilol 6.25 MG tablet Commonly known as: COREG Take 1 tablet (6.25 mg total) by mouth 2 (two) times daily with a meal.   cetirizine 10 MG tablet Commonly known as: ZYRTEC Take 10 mg by mouth daily.   losartan 25 MG tablet Commonly known as: COZAAR Take 0.5 tablets (12.5 mg total) by mouth daily.   nitroGLYCERIN 0.4 MG SL tablet Commonly known as: NITROSTAT Place 1 tablet (0.4 mg total) under the tongue every 5 (five) minutes as needed for chest pain (up to 3 doses. If taking 3rd dose, call 911.).   ranitidine 150 MG capsule Commonly known as: ZANTAC Take 150 mg by mouth daily as needed for heartburn.   simethicone 125 MG chewable tablet Commonly known as: MYLICON Chew 125 mg by mouth every 6 (six)  hours as needed for flatulence.   ticagrelor 90 MG Tabs tablet Commonly known as: BRILINTA Take 1 tablet (90 mg total) by mouth 2 (two) times daily.          Outstanding Labs/Studies   Recommend CMET, CBC in follow-up  Duration of Discharge Encounter    Greater than 30 minutes including physician time.  Signed, Laurann Montana, PA-C 06/01/2019, 2:17 PM

## 2019-06-01 NOTE — Progress Notes (Signed)
Progress Note  Patient Name: LENDELL GALLICK Date of Encounter: 06/01/2019  Primary Cardiologist:   [name], MD   Subjective   No pain.  Mild SOB last night  Inpatient Medications    Scheduled Meds: . aspirin  81 mg Oral Daily  . atorvastatin  80 mg Oral q1800  . carvedilol  6.25 mg Oral BID WC  . Chlorhexidine Gluconate Cloth  6 each Topical Daily  . enoxaparin (LOVENOX) injection  40 mg Subcutaneous Q24H  . sodium chloride flush  3 mL Intravenous Q12H  . ticagrelor  90 mg Oral BID   Continuous Infusions: . sodium chloride     PRN Meds: sodium chloride, acetaminophen, ALPRAZolam, nitroGLYCERIN, ondansetron (ZOFRAN) IV, sodium chloride flush, zolpidem   Vital Signs    Vitals:   05/31/19 2100 05/31/19 2136 05/31/19 2150 06/01/19 0928  BP: 95/72 109/82 114/85 110/78  Pulse: 87 92 88 82  Resp:   16   Temp:   99.4 F (37.4 C)   TempSrc:   Oral   SpO2: 96% 95% 97% 98%  Weight:      Height:       No intake or output data in the 24 hours ending 06/01/19 1241 Filed Weights   05/29/19 0900 05/29/19 1600 05/30/19 0348  Weight: 85.7 kg 82.1 kg 83.1 kg    Telemetry    NSR - Personally Reviewed  ECG    NA - Personally Reviewed  Physical Exam   GEN: No acute distress.   Neck: No  JVD Cardiac: RRR, no murmurs, rubs, or gallops.  Respiratory: Clear  to auscultation bilaterally. GI: Soft, nontender, non-distended  MS: No edema; No deformity.  Right radial without bruising or bleeding Neuro:  Nonfocal  Psych: Normal affect   Labs    Chemistry Recent Labs  Lab 05/29/19 0934 05/29/19 1209 05/30/19 0216 05/31/19 0209 06/01/19 0557  NA 136   < > 139 139 139  K 3.5   < > 3.5 3.5 3.8  CL 105   < > 101 103 106  CO2 19*   < > 27 26 24   GLUCOSE 127*   < > 113* 95 101*  BUN 8   < > 12 15 17   CREATININE 0.94   < > 1.25* 1.13 1.14  CALCIUM 8.8*   < > 9.0 9.0 8.9  PROT 6.3*  --   --   --   --   ALBUMIN 3.7  --   --   --   --   AST 89*  --   --   --    --   ALT 36  --   --   --   --   ALKPHOS 77  --   --   --   --   BILITOT 0.7  --   --   --   --   GFRNONAA >60   < > >60 >60 >60  GFRAA >60   < > >60 >60 >60  ANIONGAP 12   < > 11 10 9    < > = values in this interval not displayed.     Hematology Recent Labs  Lab 05/29/19 0934 05/29/19 1211 05/30/19 0216  WBC 12.7* 16.4* 15.5*  RBC 4.91 4.99 4.95  HGB 15.6 15.9 15.5  HCT 43.4 44.5 44.3  MCV 88.4 89.2 89.5  MCH 31.8 31.9 31.3  MCHC 35.9 35.7 35.0  RDW 11.5 11.8 11.9  PLT 279 301 281    Cardiac EnzymesNo  results for input(s): TROPONINI in the last 168 hours. No results for input(s): TROPIPOC in the last 168 hours.   BNPNo results for input(s): BNP, PROBNP in the last 168 hours.   DDimer No results for input(s): DDIMER in the last 168 hours.   Radiology    ECHOCARDIOGRAM COMPLETE  Result Date: 05/30/2019    ECHOCARDIOGRAM REPORT   Patient Name:   ZEYAD DELAGUILA Date of Exam: 05/30/2019 Medical Rec #:  [id number]        Height:       67.5 in Accession #:    [id number]       Weight:       183.2 lb Date of Birth:  10/03/1965        BSA:          1.959 m Patient Age:    54 years         BP:           82/63 mmHg Patient Gender: M                HR:           70 bpm. Exam Location:  Inpatient Procedure: 2D Echo, 3D Echo, Color Doppler, Cardiac Doppler and Strain Analysis Indications:    CAD Native Vessel i25.10  History:        Patient has no prior history of Echocardiogram examinations.                 CHF, STEMI; Risk Factors:Dyslipidemia.  Sonographer:    Irving Burton Senior RDCS Referring Phys: [phone] DAVID W HARDING IMPRESSIONS  1. Wall motion abnormalities in the proximal LAD territory. This might represent stunning or in infarct. Repeat echocardiogram in 3 months is recommended.  2. Left ventricular ejection fraction, by estimation, is 35 to 40%. The left ventricle has moderately decreased function. The left ventricle demonstrates regional wall motion abnormalities (see scoring  diagram/findings for description). There is mild concentric left ventricular hypertrophy. Left ventricular diastolic parameters are consistent with Grade I diastolic dysfunction (impaired relaxation). The average left ventricular global longitudinal strain is -11.4 %.  3. Right ventricular systolic function is normal. The right ventricular size is normal.  4. The mitral valve is normal in structure. No evidence of mitral valve regurgitation. No evidence of mitral stenosis.  5. The aortic valve is normal in structure. Aortic valve regurgitation is not visualized. No aortic stenosis is present.  6. The inferior vena cava is normal in size with greater than 50% respiratory variability, suggesting right atrial pressure of 3 mmHg. FINDINGS  Left Ventricle: Left ventricular ejection fraction, by estimation, is 35 to 40%. The left ventricle has moderately decreased function. The left ventricle demonstrates regional wall motion abnormalities. The average left ventricular global longitudinal strain is -11.4 %. The left ventricular internal cavity size was normal in size. There is mild concentric left ventricular hypertrophy. Left ventricular diastolic parameters are consistent with Grade I diastolic dysfunction (impaired relaxation). Normal left ventricular filling pressure.  LV Wall Scoring: The entire anterior wall, mid and distal anterior septum, and apex are akinetic. The mid anterolateral segment is normal. Right Ventricle: The right ventricular size is normal. No increase in right ventricular wall thickness. Right ventricular systolic function is normal. Left Atrium: Left atrial size was normal in size. Right Atrium: Right atrial size was normal in size. Pericardium: There is no evidence of pericardial effusion. Mitral Valve: The mitral valve is normal in structure. Normal mobility of the mitral valve leaflets.  No evidence of mitral valve regurgitation. No evidence of mitral valve stenosis. Tricuspid Valve: The  tricuspid valve is normal in structure. Tricuspid valve regurgitation is not demonstrated. No evidence of tricuspid stenosis. Aortic Valve: The aortic valve is normal in structure. Aortic valve regurgitation is not visualized. No aortic stenosis is present. Pulmonic Valve: The pulmonic valve was normal in structure. Pulmonic valve regurgitation is not visualized. No evidence of pulmonic stenosis. Aorta: The aortic root is normal in size and structure. Venous: The inferior vena cava is normal in size with greater than 50% respiratory variability, suggesting right atrial pressure of 3 mmHg. IAS/Shunts: No atrial level shunt detected by color flow Doppler.  LEFT VENTRICLE PLAX 2D LVIDd:         3.30 cm     Diastology LVIDs:         2.90 cm     LV e' lateral:   7.18 cm/s LV PW:         1.20 cm     LV E/e' lateral: 9.2 LV IVS:        1.10 cm     LV e' medial:    5.55 cm/s LVOT diam:     1.80 cm     LV E/e' medial:  11.9 LV SV:         41 LV SV Index:   21          2D Longitudinal Strain LVOT Area:     2.54 cm    2D Strain GLS Avg:     -11.4 %  LV Volumes (MOD) LV vol d, MOD A2C: 73.9 ml LV vol d, MOD A4C: 93.2 ml LV vol s, MOD A2C: 40.8 ml LV vol s, MOD A4C: 49.7 ml LV SV MOD A2C:     33.1 ml LV SV MOD A4C:     93.2 ml LV SV MOD BP:      38.3 ml RIGHT VENTRICLE RV S prime:     10.90 cm/s TAPSE (M-mode): 1.4 cm LEFT ATRIUM             Index       RIGHT ATRIUM           Index LA diam:        3.10 cm 1.58 cm/m  RA Area:     11.30 cm LA Vol (A2C):   32.1 ml 16.39 ml/m RA Volume:   23.50 ml  12.00 ml/m LA Vol (A4C):   45.5 ml 23.23 ml/m LA Biplane Vol: 39.5 ml 20.16 ml/m  AORTIC VALVE LVOT Vmax:   92.00 cm/s LVOT Vmean:  62.400 cm/s LVOT VTI:    0.162 m  AORTA Ao Root diam: 3.30 cm Ao Asc diam:  3.50 cm MITRAL VALVE MV Area (PHT): 3.42 cm    SHUNTS MV Decel Time: 222 msec    Systemic VTI:  0.16 m MV E velocity: 66.00 cm/s  Systemic Diam: 1.80 cm MV A velocity: 75.40 cm/s MV E/A ratio:  0.88 Ena Dawley MD  Electronically signed by Ena Dawley MD Signature Date/Time: 05/30/2019/7:59:45 PM    Final     Cardiac Studies   Coronary Diagrams  Diagnostic Dominance: Right  Intervention   ECHO:  1. Wall motion abnormalities in the proximal LAD territory. This might  represent stunning or in infarct. Repeat echocardiogram in 3 months is  recommended.  2. Left ventricular ejection fraction, by estimation, is 35 to 40%. The  left ventricle has moderately decreased function. The left ventricle  demonstrates regional wall motion abnormalities (see scoring  diagram/findings for description). There is mild  concentric left ventricular hypertrophy. Left ventricular diastolic  parameters are consistent with Grade I diastolic dysfunction (impaired  relaxation). The average left ventricular global longitudinal strain is  -11.4 %.  3. Right ventricular systolic function is normal. The right ventricular  size is normal.  4. The mitral valve is normal in structure. No evidence of mitral valve  regurgitation. No evidence of mitral stenosis.  5. The aortic valve is normal in structure. Aortic valve regurgitation is  not visualized. No aortic stenosis is present.  6. The inferior vena cava is normal in size with greater than 50%  respiratory variability, suggesting right atrial pressure of 3 mmHg.   Patient Profile     54 y.o. male postop day #3 large anterior STEMI treated with PCI and drug-eluting stenting using 3 stents in the proximal mid LAD with stent in OM 2 and an occluded RCA.  He has severe LV dysfunction.  He is currently pain-free.  Assessment & Plan    ACUTE ANTERIOR MI:  OK to discharge home.  Start low dose (12.5 mg Cozaar.)  Needs 7 day TOC  ISCHEMIC CARDIOMYOPATHY:   As above.  Does not need diuretic at discharge.    DYSLIPIDEMIA:  Meds as listed.  Discussed diet.    For questions or updates, please contact [facility] Please consult [email or web address] for contact info  under Cardiology/STEMI.   Signed, Rollene Rotunda, MD  06/01/2019, 12:41 PM

## 2019-06-01 NOTE — Progress Notes (Signed)
Pt's stable, DC home with his wife 

## 2019-06-04 ENCOUNTER — Ambulatory Visit: Payer: BLUE CROSS/BLUE SHIELD | Admitting: Cardiology

## 2019-06-04 ENCOUNTER — Telehealth (HOSPITAL_COMMUNITY): Payer: Self-pay

## 2019-06-04 NOTE — Telephone Encounter (Signed)
Called patient to see if he is interested in the Cardiac Rehab Program. Patient expressed interest. Explained scheduling process and went over insurance, patient verbalized understanding. Will contact patient for scheduling once f/u has been completed.  °

## 2019-06-04 NOTE — Telephone Encounter (Signed)
Pt insurance is active and benefits verified through [employer]. Co-pay $0.00, DED $1,600.00/$0.00 met, out of pocket $4,600.00/$0.00 met, co-insurance 20%. No pre-authorization required. Passport, 06/04/19 @ 11:39AM, REF#[id number]  Will contact patient to see if he is interested in the Cardiac Rehab Program. If interested, patient will need to complete follow up appt. Once completed, patient will be contacted for scheduling upon review by the RN Navigator. 

## 2019-06-14 ENCOUNTER — Telehealth: Payer: Self-pay | Admitting: Cardiology

## 2019-06-14 NOTE — Telephone Encounter (Signed)
Patient had a heart attack 05-29-19 and was told in the hospital he was not cleared to drive for 2 weeks. He does not have a hospital f/u until 06-24-19 but would like to drive to [facility] next week to see his Son graduate from college. He wanted to know if it would be OK for him to drive. Please let the patient know what the office decides

## 2019-06-16 NOTE — Telephone Encounter (Signed)
I imagine is probably okay to go to [place] to see graduation.  Would just recommend taking it easy not doing any aggressive walking or other physical activity.Marland KitchenMarland KitchenBryan Lemma, MD

## 2019-06-17 NOTE — Telephone Encounter (Signed)
Called patient- gave MD response.   Patient verbalized understanding.,

## 2019-06-17 NOTE — Telephone Encounter (Signed)
Patient called to check on status of his call on Friday.

## 2019-06-20 NOTE — Progress Notes (Signed)
Cardiology Office Note   Date:  06/24/2019   ID:  [name] [name], DOB [birth year], MRN [id number]  PCP:  Patient, No Pcp Per  Cardiologist:  Dr..Harding CC:Follow up    History of Present Illness: [name] [name] is a 54 y.o. male who presents for posthospitalization follow-up after admission on 05/29/2019 for acute ST elevated MI of the anterior wall, with other history of hyperlipidemia, coronary artery disease, acute combined systolic heart failure in the setting of MI, and ischemic cardiomyopathy.  Patient had cardiac catheterization on 05/29/2019 with culprit lesion of the proximal LAD to distal LAD of 100% stenosed.  A DES was placed, Resolute Onyx 2.5 x 38.  Following initial stent placement there was a distal edge tear focal 90% stenosis, stent had been pulled back during deployment likely related to deep inspiration: DES was successfully placed to the initial stent to cover the downstream anterior using Resolute Onyx 2.5 x 15 DES.  A third DES was successfully placed overlapping proximally to cover the full lesion using a resolute Onyx 3.0 x 12 mm stent.  There was also found to be a 90% lesion of the second marginal, with DES placement using a Resolute Onyx 2.2 x 12.  No residual 1/2% RPDA lesion stenosis with distal segment somewhat filled via left to right collaterals and therefore the CTO was recommended to be treated medically.  Echocardiogram revealed EF of 35 to 45% with LVEDP of 22 mmHg.  It also showed grade 1 diastolic dysfunction with no significant valvular disease.  The patient was started on carvedilol and low-dose ARB.  He was not in need of diuretic during discharge.  If the patient tolerated ARB would suggest transition to [facility]  He will have follow-up CMET and CBC at this office visit.  He was also to continue high-dose statin therapy.  Brilinta and aspirin were added.  He was to remain out of work until seen on follow-up.  He comes today with multiple questions  concerning his medications, returning to work, his wife is also on the phone on speaker asking questions as well.  He denies any chest pain dyspnea on exertion or fatigue.  He denies any melena hematochezia or epistaxis.  He does notice when he cleans himself after a bowel movement there is some bright red blood but he states this is been going on for years.  He would like 90-day supply on his medications.  He also needs a letter to return to work.  He also is requesting a referral for primary care physician.  He does complain of some tingling in his left shoulder and left arm.  This is been going on prior to his MI.  His symptoms were severe chest pain in his chest.  The arm sometimes tingles with some left shoulder pain on occasion.  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   Past Medical History:  Diagnosis Date  . Hyperlipidemia LDL goal <70   . STEMI (ST elevation myocardial infarction) ([place]) 05/29/2019    Past Surgical History:  Procedure Laterality Date  . CORONARY STENT INTERVENTION N/A 05/29/2019   Procedure: CORONARY STENT INTERVENTION;  Surgeon: Leonie Man, MD;  Location: [place] [facility];  Service: Cardiovascular;  Laterality: N/A;  . CORONARY/GRAFT ACUTE MI REVASCULARIZATION N/A 05/29/2019   Procedure: CORONARY/GRAFT ACUTE MI REVASCULARIZATION;  Surgeon: Leonie Man, MD;  Location: [place] [facility];  Service: Cardiovascular;  Laterality: N/A;  . LEFT HEART CATH AND CORONARY  ANGIOGRAPHY N/A 05/29/2019   Procedure: LEFT HEART CATH AND CORONARY ANGIOGRAPHY;  Surgeon: Leonie Man, MD;  Location: [place] [facility];  Service: Cardiovascular;  Laterality: N/A;     Current Outpatient Medications  Medication Sig Dispense Refill  . aspirin 81 MG chewable tablet Chew 1 tablet (81 mg total) by mouth daily. 30 tablet 6  . atorvastatin (LIPITOR) 80 MG tablet Take 1 tablet (80 mg total) by mouth daily at 6 PM. 30 tablet 6  . carvedilol (COREG)  6.25 MG tablet Take 1 tablet (6.25 mg total) by mouth 2 (two) times daily with a meal. 60 tablet 6  . losartan (COZAAR) 25 MG tablet Take 0.5 tablets (12.5 mg total) by mouth daily. 15 tablet 6  . nitroGLYCERIN (NITROSTAT) 0.4 MG SL tablet Place 1 tablet (0.4 mg total) under the tongue every 5 (five) minutes as needed for chest pain (up to 3 doses. If taking 3rd dose, call 911.). 25 tablet 3  . ticagrelor (BRILINTA) 90 MG TABS tablet Take 1 tablet (90 mg total) by mouth 2 (two) times daily. 60 tablet 11   No current facility-administered medications for this visit.    Allergies:   Patient has no known allergies.    Social History:  The patient  reports that he has never smoked. He has never used smokeless tobacco. He reports that he does not drink alcohol or use drugs.   Family History:  The patient's family history includes Hypertension in his mother.    ROS: All other systems are reviewed and negative. Unless otherwise mentioned in H&P    PHYSICAL EXAM: VS:  BP 92/68   Pulse 71   Temp 97.7 F (36.5 C)   Ht 5' 7.5" (1.715 m)   Wt 174 lb (78.9 kg)   BMI 26.85 kg/m  , BMI Body mass index is 26.85 kg/m. GEN: Well nourished, well developed, in no acute distress HEENT: normal Neck: no JVD, carotid bruits, or masses Cardiac: RRR; no murmurs, rubs, or gallops,no edema  Respiratory:  Clear to auscultation bilaterally, normal work of breathing GI: soft, nontender, nondistended, + BS MS: no deformity or atrophy Skin: warm and dry, no rash Neuro:  Strength and sensation are intact Psych: euthymic mood, full affect   EKG: Normal sinus rhythm, anterior septal infarct age undetermined, heart rate 71 bpm.  (Personally reviewed  Recent Labs: 05/29/2019: ALT 36; TSH 1.221 05/30/2019: Hemoglobin 15.5; Platelets 281 05/31/2019: Magnesium 2.1 06/01/2019: BUN 17; Creatinine, Ser 1.14; Potassium 3.8; Sodium 139    Lipid Panel    Component Value Date/Time   CHOL 169 05/29/2019 0934   TRIG  65 05/29/2019 0934   HDL 30 (L) 05/29/2019 0934   CHOLHDL 5.6 05/29/2019 0934   VLDL 13 05/29/2019 0934   LDLCALC 126 (H) 05/29/2019 0934      Wt Readings from Last 3 Encounters:  06/24/19 174 lb (78.9 kg)  05/30/19 183 lb 3.2 oz (83.1 kg)      Other studies Reviewed: LHC 05/29/19 Conclusion    CULPRIT LESION prox LAD to Dist LAD lesion is 100% stenosed.  Initially, a drug-eluting stent was successfully placed using a STENT RESOLUTE ONYX 2.5X38. (Eventually postdilated to 3.5 mm proximal and 3.1 mm distal)  Following initial stent placement, there was a distal edge tear focal 90% stenosis (stent had been pulled back during deployment likely related to deep inspiration.  A drug-eluting stent was successfully placed through the initial stent to cover the downstream anterior, using a STENT RESOLUTE  ONYX 2.25X15. The stent balloon was used to post dilate the overlapping segment. The proximal 4/5 of the stent was postdilated to 2.8 mm.  A third drug-eluting stent was successfully placed overlapping proximally to cover the full lesion, using a STENT RESOLUTE ONYX 3.0X12. Postdilated to 3.6 mm  Post intervention, there is a 0% residual stenosis throughout the entire 3 stented segment. Sequentially increasing noncompliant balloons sizes were used starting at 2.75 and gradually up to 3.5 mm to post dilate not just the proximal stent, pulse the entire mid 38 mm stent.  -------------------------------  2nd Vessel lesion: 2nd Mrg-1 lesion is 90% stenosed. Plan was to proceed with PCI.  A drug-eluting stent was successfully placed using a STENT RESOLUTE ONYX 2.0X12.  Post intervention, there is a 0% residual stenosis.  ---------------------------------  3rd Vesssel lesion: RPDA lesion is 100% stenosed with the distal segment somewhat filled via left to right collaterals. This was felt to be CTO & the plan is to treat medically.  ---------------------------------  There is moderate  left ventricular systolic dysfunction. The left ventricular ejection fraction is 35-45% by visual estimate.  LV end diastolic pressure is moderately elevated -22 mm  Severe Three-Vessel Disease:  Culprit lesion 100% LAD after SP1 (long lesion); 90% proximal OM 2 (with moderate LPL 160%), likely 100 and CTO of RPDA (high bifurcation, fills via left to right collateral flow) ? Successful DES PCI of extensive proximal to mid LAD using 3 overlapping Resolute Onyx DES stents (3.0 mm x 12 mm, 2.5 mm x 38 mm, and 2.25 mm x 15 mm -> entire segment postdilated from 3.65 mm down to 2.8 mm) ? EKG: Still denies of depressions but immediately of this of the EKG you are you can email he can successful stenting of proximal OM 2 with resolute Onyx DES 2.0 mm x 12 mm--postdilated to 2.2 mm)  Moderate severely reduced EF estimated as 35 to 40% with mid to apical anterior hypo to akinesis = ISCHEMIC CARDIOMYOPATHY and ACUTE SYSTOLIC HEART FAILURE  Moderate elevated LVEDP of 22 mm. ACUTE DIASTOLIC HEART FAILURE   2D Echo 05/30/19 1. Wall motion abnormalities in the proximal LAD territory. This might  represent stunning or in infarct. Repeat echocardiogram in 3 months is  recommended.  2. Left ventricular ejection fraction, by estimation, is 35 to 40%. The  left ventricle has moderately decreased function. The left ventricle  demonstrates regional wall motion abnormalities (see scoring  diagram/findings for description). There is mild  concentric left ventricular hypertrophy. Left ventricular diastolic  parameters are consistent with Grade I diastolic dysfunction (impaired  relaxation). The average left ventricular global longitudinal strain is  -11.4 %.  3. Right ventricular systolic function is normal. The right ventricular  size is normal.  4. The mitral valve is normal in structure. No evidence of mitral valve  regurgitation. No evidence of mitral stenosis.  5. The aortic valve is normal in  structure. Aortic valve regurgitation is  not visualized. No aortic stenosis is present.  6. The inferior vena cava is normal in size with greater than 50%  respiratory variability, suggesting right atrial pressure of 3 mmHg.    ASSESSMENT AND PLAN:  1.  Status post anterior MI of the left anterior descending artery: He is status post DES, x3 on 05/29/2019.  He is now on dual antiplatelet therapy with Brilinta and aspirin.  He denies any excessive bleeding or bruising.  He is having some bright red blood when he cleans himself after a bowel movement  which does not happen often however this has been going on prior to use of Brilinta.  Checking a CBC today.  He is given 90-day supply on his medications.  A letter allowing him to return to work on 07/01/2019 with restrictions of not lifting anything over 20 pounds is provided  2.  Systolic dysfunction: Uncertain if this is from stunning or from necrosis of MI.  Repeating echocardiogram in 3 months.  He is not on any diuretics at this time.  No evidence of volume overload.  He will continue low-sodium diet.  He has been walking 20 minutes a day 3 times a day without fatigue chest pain or dizziness.  Blood pressure is low normal.  He denies any dizziness or positional near syncope.  Continue carvedilol and losartan.  C-Met is ordered  3.  Hyperlipidemia: Continue high-dose atorvastatin 80 mg daily.  Follow-up lipids and LFTs prior to next appointment in 3 months.   Current medicines are reviewed at length with the patient today.  I have spent 45 minutes dedicated to the care of this patient on the date of this encounter to include pre-visit review of records, assessment, management and diagnostic testing,with shared decision making.  Labs/ tests ordered today include: CMET, and CBC today, Echo, Fasting lipids and LFT's , CBC, and  BMET in 3 months.  Phill Myron. West Pugh, ANP, AACC   06/24/2019 9:18 AM    [address] Office [phone] Fax [phone]  Notice: This dictation was prepared with Dragon dictation along with smaller phrase technology. Any transcriptional errors that result from this process are unintentional and may not be corrected upon review.

## 2019-06-24 ENCOUNTER — Other Ambulatory Visit: Payer: Self-pay

## 2019-06-24 ENCOUNTER — Ambulatory Visit (INDEPENDENT_AMBULATORY_CARE_PROVIDER_SITE_OTHER): Payer: 59 | Admitting: Adult Health

## 2019-06-24 ENCOUNTER — Encounter: Payer: Self-pay | Admitting: Adult Health

## 2019-06-24 VITALS — BP 92/68 | HR 71 | Temp 97.7°F | Ht 67.5 in | Wt 174.0 lb

## 2019-06-24 DIAGNOSIS — Z79899 Other long term (current) drug therapy: Secondary | ICD-10-CM

## 2019-06-24 DIAGNOSIS — I255 Ischemic cardiomyopathy: Secondary | ICD-10-CM

## 2019-06-24 DIAGNOSIS — I2510 Atherosclerotic heart disease of native coronary artery without angina pectoris: Secondary | ICD-10-CM

## 2019-06-24 DIAGNOSIS — I519 Heart disease, unspecified: Secondary | ICD-10-CM

## 2019-06-24 DIAGNOSIS — I2109 ST elevation (STEMI) myocardial infarction involving other coronary artery of anterior wall: Secondary | ICD-10-CM | POA: Diagnosis not present

## 2019-06-24 DIAGNOSIS — E785 Hyperlipidemia, unspecified: Secondary | ICD-10-CM | POA: Diagnosis not present

## 2019-06-24 LAB — CBC
Hematocrit: 43.2 % (ref 37.5–51.0)
Hemoglobin: 15.1 g/dL (ref 13.0–17.7)
MCH: 31.8 pg (ref 26.6–33.0)
MCHC: 35 g/dL (ref 31.5–35.7)
MCV: 91 fL (ref 79–97)
Platelets: 236 10*3/uL (ref 150–450)
RBC: 4.75 x10E6/uL (ref 4.14–5.80)
RDW: 12 % (ref 11.6–15.4)
WBC: 6.2 10*3/uL (ref 3.4–10.8)

## 2019-06-24 LAB — COMPREHENSIVE METABOLIC PANEL
ALT: 39 IU/L (ref 0–44)
AST: 21 IU/L (ref 0–40)
Albumin/Globulin Ratio: 2.2 (ref 1.2–2.2)
Albumin: 4.4 g/dL (ref 3.8–4.9)
Alkaline Phosphatase: 100 IU/L (ref 39–117)
BUN/Creatinine Ratio: 14 (ref 9–20)
BUN: 15 mg/dL (ref 6–24)
Bilirubin Total: 0.6 mg/dL (ref 0.0–1.2)
CO2: 26 mmol/L (ref 20–29)
Calcium: 9.8 mg/dL (ref 8.7–10.2)
Chloride: 101 mmol/L (ref 96–106)
Creatinine, Ser: 1.09 mg/dL (ref 0.76–1.27)
GFR calc Af Amer: 89 mL/min/{1.73_m2} (ref >59)
GFR calc non Af Amer: 77 mL/min/{1.73_m2} (ref >59)
Globulin, Total: 2 g/dL (ref 1.5–4.5)
Glucose: 90 mg/dL (ref 65–99)
Potassium: 4.4 mmol/L (ref 3.5–5.2)
Sodium: 139 mmol/L (ref 134–144)
Total Protein: 6.4 g/dL (ref 6.0–8.5)

## 2019-06-24 MED ORDER — CARVEDILOL 6.25 MG PO TABS: 6.2500 mg | ORAL_TABLET | Freq: Two times a day (BID) | ORAL | 3 refills | Status: DC

## 2019-06-24 MED ORDER — LOSARTAN POTASSIUM 25 MG PO TABS: 12.5000 mg | ORAL_TABLET | Freq: Every day | ORAL | 3 refills | Status: DC

## 2019-06-24 MED ORDER — TICAGRELOR 90 MG PO TABS: 90.0000 mg | ORAL_TABLET | Freq: Two times a day (BID) | ORAL | 3 refills | Status: DC

## 2019-06-24 MED ORDER — ATORVASTATIN CALCIUM 80 MG PO TABS: 80.0000 mg | ORAL_TABLET | Freq: Every day | ORAL | 3 refills | Status: DC

## 2019-06-24 NOTE — Patient Instructions (Signed)
Medication Instructions:  Continue current medications  *If you need a refill on your cardiac medications before your next appointment, please call your pharmacy*   Lab Work: CMP and CBC Today  Fasting Lipid, CBC, BMP and Liver function in 3 month  If you have labs (blood work) drawn today and your tests are completely normal, you will receive your results only by: Marland Kitchen MyChart Message (if you have MyChart) OR . A paper copy in the mail If you have any lab test that is abnormal or we need to change your treatment, we will call you to review the results.   Testing/Procedures: Your physician has requested that you have an echocardiogram in 3 Months. Echocardiography is a painless test that uses sound waves to create images of your heart. It provides your doctor with information about the size and shape of your heart and how well your heart's chambers and valves are working. This procedure takes approximately one hour. There are no restrictions for this procedure.   Follow-Up: At [facility], you and your health needs are our priority.  As part of our continuing mission to provide you with exceptional heart care, we have created designated Provider Care Teams.  These Care Teams include your primary Cardiologist (physician) and Advanced Practice Providers (APPs -  Physician Assistants and Nurse Practitioners) who all work together to provide you with the care you need, when you need it.  We recommend signing up for the patient portal called "MyChart".  Sign up information is provided on this After Visit Summary.  MyChart is used to connect with patients for Virtual Visits (Telemedicine).  Patients are able to view lab/test results, encounter notes, upcoming appointments, etc.  Non-urgent messages can be sent to your provider as well.   To learn more about what you can do with MyChart, go to [email or web address].    Your next appointment:   3 month(s)  The format for your next  appointment:   In Person  Provider:   You may see [name], MD or one of the following Advanced Practice Providers on your designated Care Team:    Theodore Demark, PA-C  Joni Reining, DNP, ANP  Cadence Fransico Michael, NP

## 2019-06-27 ENCOUNTER — Telehealth (HOSPITAL_COMMUNITY): Payer: Self-pay

## 2019-06-27 NOTE — Telephone Encounter (Signed)
Called patient to see if he was interested in participating in the Cardiac Rehab Program. Patient stated yes. Patient will come in for orientation on 07/18/2019@8 :00am and will attend the 7:00am exercise class.  Mailed homework package.

## 2019-07-10 ENCOUNTER — Telehealth (HOSPITAL_COMMUNITY): Payer: Self-pay | Admitting: Pharmacist

## 2019-07-10 NOTE — Telephone Encounter (Signed)
Cardiac Rehab Medication Review by a Pharmacist  Does the patient  feel that his/her medications are working for him/her?  yes  Has the patient been experiencing any side effects to the medications prescribed?  Not necessarily - patient reports some increased fatigue but this could be attributed to recovering after his heart attack   Does the patient measure his/her own blood pressure or blood glucose at home?  yes - checks blood pressure every morning, average is ~110/70s  Does the patient have any problems obtaining medications due to transportation or finances?   no  Understanding of regimen: good Understanding of indications: good Potential of compliance: good    Pharmacist comments: none noted.   Thank you,   Fara Olden, PharmD PGY-1 Pharmacy Resident   Please check amion for clinical pharmacist contact number  07/10/2019 4:43 PM

## 2019-07-16 ENCOUNTER — Encounter (HOSPITAL_COMMUNITY)
Admission: RE | Admit: 2019-07-16 | Discharge: 2019-07-16 | Disposition: A | Discharge status: Home / Self Care | Payer: BC Managed Care – PPO | Source: Ambulatory Visit | Attending: Cardiology | Admitting: Cardiology

## 2019-07-16 ENCOUNTER — Other Ambulatory Visit: Payer: Self-pay

## 2019-07-16 DIAGNOSIS — I2102 ST elevation (STEMI) myocardial infarction involving left anterior descending coronary artery: Secondary | ICD-10-CM | POA: Insufficient documentation

## 2019-07-16 DIAGNOSIS — Z955 Presence of coronary angioplasty implant and graft: Secondary | ICD-10-CM | POA: Insufficient documentation

## 2019-07-16 NOTE — Progress Notes (Signed)
Cardiac Rehab Note:  Successful telephone encounter to [name] [name] to confirm Cardiac Rehab orientation appointment for 07/18/19 at 0800. Nursing assessment completed. Patient questions answered. Instructions for appointment provided. Patient screening for Covid-19 negative.  Mahalia Dykes E. Suzie Portela RN, BSN [place]. [name] [name]  Cardiac and Pulmonary Rehabilitation Phone: [phone] Fax: [phone]

## 2019-07-18 ENCOUNTER — Encounter (HOSPITAL_COMMUNITY)
Admission: RE | Admit: 2019-07-18 | Discharge: 2019-07-18 | Disposition: A | Discharge status: Home / Self Care | Payer: BC Managed Care – PPO | Source: Ambulatory Visit | Attending: Cardiology | Admitting: Cardiology

## 2019-07-18 ENCOUNTER — Other Ambulatory Visit: Payer: Self-pay

## 2019-07-18 ENCOUNTER — Encounter (HOSPITAL_COMMUNITY): Payer: Self-pay

## 2019-07-18 VITALS — BP 98/60 | HR 79 | Ht 67.0 in | Wt 173.9 lb

## 2019-07-18 DIAGNOSIS — Z955 Presence of coronary angioplasty implant and graft: Secondary | ICD-10-CM

## 2019-07-18 DIAGNOSIS — I2102 ST elevation (STEMI) myocardial infarction involving left anterior descending coronary artery: Secondary | ICD-10-CM

## 2019-07-18 NOTE — Progress Notes (Signed)
Cardiac Individual Treatment Plan  Patient Details  Name: Unnamed Zeien MRN: [id number] Date of Birth: [birth year] Referring Provider:     CARDIAC REHAB PHASE II ORIENTATION from 07/18/2019 in [facility] CARDIAC [facility]  Referring Provider  Marykay Lex.      Initial Encounter Date:    CARDIAC REHAB PHASE II ORIENTATION from 07/18/2019 in [facility] CARDIAC REHAB  Date  07/18/19      Visit Diagnosis: ST elevation myocardial infarction involving left anterior descending (LAD) coronary artery (HCC) 05/29/19  S/P DES LAD 05/29/19  Patient's Home Medications on Admission:  Current Outpatient Medications:  .  aspirin 81 MG chewable tablet, Chew 1 tablet (81 mg total) by mouth daily., Disp: 30 tablet, Rfl: 6 .  atorvastatin (LIPITOR) 80 MG tablet, Take 1 tablet (80 mg total) by mouth daily at 6 PM., Disp: 180 tablet, Rfl: 3 .  carvedilol (COREG) 6.25 MG tablet, Take 1 tablet (6.25 mg total) by mouth 2 (two) times daily with a meal., Disp: 180 tablet, Rfl: 3 .  cetirizine (ZYRTEC) 10 MG tablet, Take 10 mg by mouth daily as needed for allergies. Takes every 2-3 days, Disp: , Rfl:  .  losartan (COZAAR) 25 MG tablet, Take 0.5 tablets (12.5 mg total) by mouth daily., Disp: 45 tablet, Rfl: 3 .  nitroGLYCERIN (NITROSTAT) 0.4 MG SL tablet, Place 1 tablet (0.4 mg total) under the tongue every 5 (five) minutes as needed for chest pain (up to 3 doses. If taking 3rd dose, call 911.). (Patient not taking: Reported on 07/10/2019), Disp: 25 tablet, Rfl: 3 .  ticagrelor (BRILINTA) 90 MG TABS tablet, Take 1 tablet (90 mg total) by mouth 2 (two) times daily., Disp: 180 tablet, Rfl: 3  Past Medical History: Past Medical History:  Diagnosis Date  . Hyperlipidemia LDL goal <70   . STEMI (ST elevation myocardial infarction) (HCC) 05/29/2019    Tobacco Use: Social History   Tobacco Use  Smoking Status Never Smoker  Smokeless Tobacco Never Used    Labs: Recent  Review Advice worker    Labs for ITP Cardiac and Pulmonary Rehab Latest Ref Rng & Units 05/29/2019   Cholestrol 0 - 200 mg/dL 841   LDLCALC 0 - 99 mg/dL 660(Y)   HDL >30 mg/dL 16(W)   Trlycerides <109 mg/dL 65   Hemoglobin N2T 4.8 - 5.6 % 5.6   TCO2 22 - 32 mmol/L 24      Capillary Blood Glucose: No results found for: GLUCAP   Exercise Target Goals: Exercise Program Goal: Individual exercise prescription set using results from initial 6 min walk test and THRR while considering  patient's activity barriers and safety.   Exercise Prescription Goal: Starting with aerobic activity 30 plus minutes a day, 3 days per week for initial exercise prescription. Provide home exercise prescription and guidelines that participant acknowledges understanding prior to discharge.  Activity Barriers & Risk Stratification: Activity Barriers & Cardiac Risk Stratification - 07/18/19 1010      Activity Barriers & Cardiac Risk Stratification   Activity Barriers  None    Cardiac Risk Stratification  High       6 Minute Walk: 6 Minute Walk    Row Name 07/18/19 0857         6 Minute Walk   Distance  2000 feet     Walk Time  6 minutes     # of Rest Breaks  0     MPH  3.78  METS  5.05     RPE  10     Perceived Dyspnea   0     VO2 Peak  17.7     Symptoms  No     Resting HR  79 bpm     Resting BP  98/60     Resting Oxygen Saturation   97 %     Exercise Oxygen Saturation  during 6 min walk  96 %     Max Ex. HR  107 bpm     Max Ex. BP  122/68     2 Minute Post BP  108/62        Oxygen Initial Assessment:   Oxygen Re-Evaluation:   Oxygen Discharge (Final Oxygen Re-Evaluation):   Initial Exercise Prescription: Initial Exercise Prescription - 07/18/19 1100      Date of Initial Exercise RX and Referring Provider   Date  07/18/19    Referring Provider  Marykay LexHarding, David W.    Expected Discharge Date  09/13/19      Bike   Level  2    Watts  35    Minutes  15    METs  2.4       NuStep   Level  3    SPM  60    Minutes  15    METs  2.5      Prescription Details   Frequency (times per week)  3    Duration  Progress to 30 minutes of continuous aerobic without signs/symptoms of physical distress      Intensity   THRR 40-80% of Max Heartrate  67 -134    Ratings of Perceived Exertion  11-13    Perceived Dyspnea  0-4      Resistance Training   Training Prescription  Yes    Weight  4    Reps  10-15       Perform Capillary Blood Glucose checks as needed.  Exercise Prescription Changes:   Exercise Comments:   Exercise Goals and Review:  Exercise Goals    Row Name 07/18/19 1010             Exercise Goals   Increase Physical Activity  Yes       Intervention  Provide advice, education, support and counseling about physical activity/exercise needs.;Develop an individualized exercise prescription for aerobic and resistive training based on initial evaluation findings, risk stratification, comorbidities and participant's personal goals.       Expected Outcomes  Short Term: Attend rehab on a regular basis to increase amount of physical activity.;Long Term: Add in home exercise to make exercise part of routine and to increase amount of physical activity.;Long Term: Exercising regularly at least 3-5 days a week.       Increase Strength and Stamina  Yes       Intervention  Provide advice, education, support and counseling about physical activity/exercise needs.;Develop an individualized exercise prescription for aerobic and resistive training based on initial evaluation findings, risk stratification, comorbidities and participant's personal goals.       Expected Outcomes  Short Term: Increase workloads from initial exercise prescription for resistance, speed, and METs.;Short Term: Perform resistance training exercises routinely during rehab and add in resistance training at home;Long Term: Improve cardiorespiratory fitness, muscular endurance and strength as  measured by increased METs and functional capacity (6MWT)       Able to understand and use rate of perceived exertion (RPE) scale  Yes       Intervention  Provide education and explanation on how to use RPE scale       Expected Outcomes  Short Term: Able to use RPE daily in rehab to express subjective intensity level;Long Term:  Able to use RPE to guide intensity level when exercising independently       Knowledge and understanding of Target Heart Rate Range (THRR)  Yes       Intervention  Provide education and explanation of THRR including how the numbers were predicted and where they are located for reference       Expected Outcomes  Short Term: Able to state/look up THRR;Short Term: Able to use daily as guideline for intensity in rehab;Long Term: Able to use THRR to govern intensity when exercising independently       Able to check pulse independently  Yes       Intervention  Provide education and demonstration on how to check pulse in carotid and radial arteries.;Review the importance of being able to check your own pulse for safety during independent exercise       Expected Outcomes  Short Term: Able to explain why pulse checking is important during independent exercise;Long Term: Able to check pulse independently and accurately       Understanding of Exercise Prescription  Yes       Intervention  Provide education, explanation, and written materials on patient's individual exercise prescription       Expected Outcomes  Short Term: Able to explain program exercise prescription;Long Term: Able to explain home exercise prescription to exercise independently          Exercise Goals Re-Evaluation :    Discharge Exercise Prescription (Final Exercise Prescription Changes):   Nutrition:  Target Goals: Understanding of nutrition guidelines, daily intake of sodium 1500mg , cholesterol 200mg , calories 30% from fat and 7% or less from saturated fats, daily to have 5 or more servings of fruits and  vegetables.  Biometrics: Pre Biometrics - 07/18/19 1011      Pre Biometrics   Height  5\' 7"  (1.702 m)    Weight  78.9 kg    Waist Circumference  38.25 inches    Hip Circumference  39.25 inches    Waist to Hip Ratio  0.97 %    BMI (Calculated)  27.24    Triceps Skinfold  11 mm    % Body Fat  24.9 %    Grip Strength  52 kg    Flexibility  14 in    Single Leg Stand  120 seconds        Nutrition Therapy Plan and Nutrition Goals:   Nutrition Assessments:   Nutrition Goals Re-Evaluation:   Nutrition Goals Discharge (Final Nutrition Goals Re-Evaluation):   Psychosocial: Target Goals: Acknowledge presence or absence of significant depression and/or stress, maximize coping skills, provide positive support system. Participant is able to verbalize types and ability to use techniques and skills needed for reducing stress and depression.  Initial Review & Psychosocial Screening: Initial Psych Review & Screening - 07/18/19 1258      Initial Review   Current issues with  None Identified      Family Dynamics   Good Support System?  Yes   [name] has his wife and children for support     Barriers   Psychosocial barriers to participate in program  There are no identifiable barriers or psychosocial needs.      Screening Interventions   Interventions  Encouraged to exercise       Quality of  Life Scores: Quality of Life - 07/18/19 1001      Quality of Life   Select  Quality of Life      Quality of Life Scores   Health/Function Pre  31.4 %    Socioeconomic Pre  19.14 %    Psych/Spiritual Pre  19.86 %    Family Pre  24.9 %    GLOBAL Pre  25.54 %      Scores of 19 and below usually indicate a poorer quality of life in these areas.  A difference of  2-3 points is a clinically meaningful difference.  A difference of 2-3 points in the total score of the Quality of Life Index has been associated with significant improvement in overall quality of life, self-image, physical  symptoms, and general health in studies assessing change in quality of life.  PHQ-9: Recent Review Flowsheet Data    Depression screen [facility] 2/9 07/18/2019   Decreased Interest 0   Down, Depressed, Hopeless 0   PHQ - 2 Score 0     Interpretation of Total Score  Total Score Depression Severity:  1-4 = Minimal depression, 5-9 = Mild depression, 10-14 = Moderate depression, 15-19 = Moderately severe depression, 20-27 = Severe depression   Psychosocial Evaluation and Intervention:   Psychosocial Re-Evaluation:   Psychosocial Discharge (Final Psychosocial Re-Evaluation):   Vocational Rehabilitation: Provide vocational rehab assistance to qualifying candidates.   Vocational Rehab Evaluation & Intervention: Vocational Rehab - 07/18/19 1300      Initial Vocational Rehab Evaluation & Intervention   Assessment shows need for Vocational Rehabilitation  No   Mr Bua works full time and does not need vocational rehab at this time      Education: Education Goals: Education classes will be provided on a weekly basis, covering required topics. Participant will state understanding/return demonstration of topics presented.  Learning Barriers/Preferences: Learning Barriers/Preferences - 07/18/19 1135      Learning Barriers/Preferences   Learning Barriers  None    Learning Preferences  Audio;Verbal Instruction       Education Topics: Hypertension, Hypertension Reduction -Define heart disease and high blood pressure. Discus how high blood pressure affects the body and ways to reduce high blood pressure.   Exercise and Your Heart -Discuss why it is important to exercise, the FITT principles of exercise, normal and abnormal responses to exercise, and how to exercise safely.   Angina -Discuss definition of angina, causes of angina, treatment of angina, and how to decrease risk of having angina.   Cardiac Medications -Review what the following cardiac medications are used for, how  they affect the body, and side effects that may occur when taking the medications.  Medications include Aspirin, Beta blockers, calcium channel blockers, ACE Inhibitors, angiotensin receptor blockers, diuretics, digoxin, and antihyperlipidemics.   Congestive Heart Failure -Discuss the definition of CHF, how to live with CHF, the signs and symptoms of CHF, and how keep track of weight and sodium intake.   Heart Disease and Intimacy -Discus the effect sexual activity has on the heart, how changes occur during intimacy as we age, and safety during sexual activity.   Smoking Cessation / COPD -Discuss different methods to quit smoking, the health benefits of quitting smoking, and the definition of COPD.   Nutrition I: Fats -Discuss the types of cholesterol, what cholesterol does to the heart, and how cholesterol levels can be controlled.   Nutrition II: Labels -Discuss the different components of food labels and how to read food label  Heart Parts/Heart Disease and PAD -Discuss the anatomy of the heart, the pathway of blood circulation through the heart, and these are affected by heart disease.   Stress I: Signs and Symptoms -Discuss the causes of stress, how stress may lead to anxiety and depression, and ways to limit stress.   Stress II: Relaxation -Discuss different types of relaxation techniques to limit stress.   Warning Signs of Stroke / TIA -Discuss definition of a stroke, what the signs and symptoms are of a stroke, and how to identify when someone is having stroke.   Knowledge Questionnaire Score: Knowledge Questionnaire Score - 07/18/19 1001      Knowledge Questionnaire Score   Pre Score  23/24       Core Components/Risk Factors/Patient Goals at Admission: Personal Goals and Risk Factors at Admission - 07/18/19 1131      Core Components/Risk Factors/Patient Goals on Admission    Weight Management  Weight Loss;Yes    Intervention  Weight Management: Provide  education and appropriate resources to help participant work on and attain dietary goals.;Weight Management: Develop a combined nutrition and exercise program designed to reach desired caloric intake, while maintaining appropriate intake of nutrient and fiber, sodium and fats, and appropriate energy expenditure required for the weight goal.    Admit Weight  173 lb 15.1 oz (78.9 kg)    Goal Weight: Short Term  5 lb (2.268 kg)    Expected Outcomes  Short Term: Continue to assess and modify interventions until short term weight is achieved;Long Term: Adherence to nutrition and physical activity/exercise program aimed toward attainment of established weight goal;Weight Loss: Understanding of general recommendations for a balanced deficit meal plan, which promotes 1-2 lb weight loss per week and includes a negative energy balance of [phone] kcal/d;Understanding recommendations for meals to include 15-35% energy as protein, 25-35% energy from fat, 35-60% energy from carbohydrates, less than 200mg  of dietary cholesterol, 20-35 gm of total fiber daily;Understanding of distribution of calorie intake throughout the day with the consumption of 4-5 meals/snacks    Lipids  Yes    Intervention  Provide education and support for participant on nutrition & aerobic/resistive exercise along with prescribed medications to achieve LDL 70mg , HDL >40mg .    Expected Outcomes  Short Term: Participant states understanding of desired cholesterol values and is compliant with medications prescribed. Participant is following exercise prescription and nutrition guidelines.;Long Term: Cholesterol controlled with medications as prescribed, with individualized exercise RX and with personalized nutrition plan. Value goals: LDL < 70mg , HDL > 40 mg.       Core Components/Risk Factors/Patient Goals Review:    Core Components/Risk Factors/Patient Goals at Discharge (Final Review):    ITP Comments: ITP Comments    Row Name 07/18/19  0834           ITP Comments  Dr. MD, Medical Director          Comments: 09/17/19 attended orientation on 07/18/2019 to review rules and guidelines for program.  Completed 6 minute walk test, Intitial ITP, and exercise prescription.  VSS. Telemetry-Sinus Rhythm.  Asymptomatic. Safety measures and social distancing in place per CDC guidelines.[name], RN,BSN 07/18/2019 1:13 PM

## 2019-07-22 ENCOUNTER — Other Ambulatory Visit: Payer: Self-pay

## 2019-07-22 ENCOUNTER — Encounter (HOSPITAL_COMMUNITY)
Admission: RE | Admit: 2019-07-22 | Discharge: 2019-07-22 | Disposition: A | Discharge status: Home / Self Care | Payer: BC Managed Care – PPO | Source: Ambulatory Visit | Attending: Cardiology | Admitting: Cardiology

## 2019-07-22 DIAGNOSIS — Z955 Presence of coronary angioplasty implant and graft: Secondary | ICD-10-CM

## 2019-07-22 DIAGNOSIS — I2102 ST elevation (STEMI) myocardial infarction involving left anterior descending coronary artery: Secondary | ICD-10-CM

## 2019-07-22 NOTE — Progress Notes (Signed)
Daily Session Note  Patient Details  Name: [name] [name] MRN: [id number] Date of Birth: 1966-01-07 Referring Provider:     CARDIAC REHAB PHASE II ORIENTATION from 07/18/2019 in [facility]  Referring Provider  Leonie Man      Encounter Date: 07/22/2019  Check In:   Capillary Blood Glucose: No results found for this or any previous visit (from the past 24 hour(s)).    Social History   Tobacco Use  Smoking Status Never Smoker  Smokeless Tobacco Never Used    Goals Met:  Exercise tolerated well Personal goals reviewed Strength training completed today  Goals Unmet:  Not Applicable  Comments: Pt started cardiac rehab today.  Pt tolerated light exercise without difficulty. VSS, telemetry-NSR, asymptomatic.  Medication list reconciled. Pt denies barriers to medicaiton compliance.  PSYCHOSOCIAL ASSESSMENT:  PHQ-0. Pt exhibits positive coping skills, hopeful outlook with supportive family. No psychosocial needs identified at this time, no psychosocial interventions necessary.  Pt oriented to exercise equipment and routine. Understanding verbalized.   Dr. Fransico Him is Medical Director for Cardiac Rehab at [facility].

## 2019-07-24 ENCOUNTER — Encounter (HOSPITAL_COMMUNITY)
Admission: RE | Admit: 2019-07-24 | Discharge: 2019-07-24 | Disposition: A | Discharge status: Home / Self Care | Payer: BC Managed Care – PPO | Source: Ambulatory Visit | Attending: Cardiology | Admitting: Cardiology

## 2019-07-24 ENCOUNTER — Other Ambulatory Visit: Payer: Self-pay

## 2019-07-24 DIAGNOSIS — Z955 Presence of coronary angioplasty implant and graft: Secondary | ICD-10-CM | POA: Diagnosis not present

## 2019-07-24 DIAGNOSIS — I2102 ST elevation (STEMI) myocardial infarction involving left anterior descending coronary artery: Secondary | ICD-10-CM | POA: Diagnosis not present

## 2019-07-26 ENCOUNTER — Other Ambulatory Visit: Payer: Self-pay

## 2019-07-26 ENCOUNTER — Encounter (HOSPITAL_COMMUNITY)
Admission: RE | Admit: 2019-07-26 | Discharge: 2019-07-26 | Disposition: A | Discharge status: Home / Self Care | Payer: BC Managed Care – PPO | Source: Ambulatory Visit | Attending: Cardiology | Admitting: Cardiology

## 2019-07-26 VITALS — Ht 67.0 in | Wt 173.0 lb

## 2019-07-26 DIAGNOSIS — Z955 Presence of coronary angioplasty implant and graft: Secondary | ICD-10-CM | POA: Diagnosis not present

## 2019-07-26 DIAGNOSIS — I2102 ST elevation (STEMI) myocardial infarction involving left anterior descending coronary artery: Secondary | ICD-10-CM

## 2019-07-26 NOTE — Progress Notes (Signed)
[name] [name] 54 y.o. male Nutrition Note   Visit Diagnosis: ST elevation myocardial infarction involving left anterior descending (LAD) coronary artery (HCC) 05/29/19  S/P DES LAD 05/29/19  Past Medical History:  Diagnosis Date  . Hyperlipidemia LDL goal <70   . STEMI (ST elevation myocardial infarction) (HCC) 05/29/2019     Medications reviewed.   Current Outpatient Medications:  .  aspirin 81 MG chewable tablet, Chew 1 tablet (81 mg total) by mouth daily., Disp: 30 tablet, Rfl: 6 .  atorvastatin (LIPITOR) 80 MG tablet, Take 1 tablet (80 mg total) by mouth daily at 6 PM., Disp: 180 tablet, Rfl: 3 .  carvedilol (COREG) 6.25 MG tablet, Take 1 tablet (6.25 mg total) by mouth 2 (two) times daily with a meal., Disp: 180 tablet, Rfl: 3 .  cetirizine (ZYRTEC) 10 MG tablet, Take 10 mg by mouth daily as needed for allergies. Takes every 2-3 days, Disp: , Rfl:  .  losartan (COZAAR) 25 MG tablet, Take 0.5 tablets (12.5 mg total) by mouth daily., Disp: 45 tablet, Rfl: 3 .  nitroGLYCERIN (NITROSTAT) 0.4 MG SL tablet, Place 1 tablet (0.4 mg total) under the tongue every 5 (five) minutes as needed for chest pain (up to 3 doses. If taking 3rd dose, call 911.). (Patient not taking: Reported on 07/10/2019), Disp: 25 tablet, Rfl: 3 .  ticagrelor (BRILINTA) 90 MG TABS tablet, Take 1 tablet (90 mg total) by mouth 2 (two) times daily., Disp: 180 tablet, Rfl: 3   Ht Readings from Last 1 Encounters:  07/18/19 5\' 7"  (1.702 m)     Wt Readings from Last 3 Encounters:  07/18/19 173 lb 15.1 oz (78.9 kg)  06/24/19 174 lb (78.9 kg)  05/30/19 183 lb 3.2 oz (83.1 kg)     There is no height or weight on file to calculate BMI.   Social History   Tobacco Use  Smoking Status Never Smoker  Smokeless Tobacco Never Used     Lab Results  Component Value Date   CHOL 169 05/29/2019   Lab Results  Component Value Date   HDL 30 (L) 05/29/2019   Lab Results  Component Value Date   LDLCALC 126 (H)  05/29/2019   Lab Results  Component Value Date   TRIG 65 05/29/2019     Lab Results  Component Value Date   HGBA1C 5.6 05/29/2019     CBG (last 3)  No results for input(s): GLUCAP in the last 72 hours.   Nutrition Note  Spoke with pt. Nutrition Plan and Nutrition Survey goals reviewed with pt. Pt has made changes to his diet since his MI. He does not enjoy the changes. He states he has always been a meat/potatoes guy and he hates vegetables/fruit. He has made healthy swaps and is eating less red meat, more whole grain products, and limiting sodium/eating out less.  He has lost 20 lbs since his MI due to eating less because he does not like the food he is eating. He is interested in making more changes and would like to enjoy his food more. We discussed trying new foods and ways to incorporate veggies/fruit in ways he enjoys (ie trying smoothies at home)  Pt expressed understanding of the information reviewed.    Nutrition Diagnosis ? Inappropriate intake of saturated fats related excessive consumption of fatty meats and processed foods as evidenced by pt's diet recall and LDL value of 126 mg/dl  Nutrition Intervention ? Pt's individual nutrition plan reviewed with pt. ? Benefits  of adopting Heart Healthy diet discussed when Medficts reviewed.   ? Continue client-centered nutrition education by RD, as part of interdisciplinary care.  Goal(s) ? Pt to build a healthy plate including vegetables, fruits, whole grains, and low-fat dairy products in a heart healthy meal plan. ? Pt to eat a variety of non-starchy vegetables and fruits  Plan:   Will provide client-centered nutrition education as part of interdisciplinary care  Monitor and evaluate progress toward nutrition goal with team.   Michaele Offer, MS, RDN, LDN

## 2019-07-29 ENCOUNTER — Encounter (HOSPITAL_COMMUNITY)
Admission: RE | Admit: 2019-07-29 | Discharge: 2019-07-29 | Disposition: A | Discharge status: Home / Self Care | Payer: BC Managed Care – PPO | Source: Ambulatory Visit | Attending: Cardiology | Admitting: Cardiology

## 2019-07-29 ENCOUNTER — Other Ambulatory Visit: Payer: Self-pay

## 2019-07-29 DIAGNOSIS — Z955 Presence of coronary angioplasty implant and graft: Secondary | ICD-10-CM | POA: Diagnosis not present

## 2019-07-29 DIAGNOSIS — I2102 ST elevation (STEMI) myocardial infarction involving left anterior descending coronary artery: Secondary | ICD-10-CM

## 2019-07-29 NOTE — Progress Notes (Signed)
Reviewed home exercise guidelines with patient including endpoints, temperature precautions, target heart rate and rate of perceived exertion. Pt is walking 15 minutes, 2 days/week as his mode of home exercise. Discussed increasing exercise duration either to 15 minutes, twice/day or increase to 30 minutes once/day, and patient is amenable to this. Patient has a smart watch to monitor his heart rate. Pt voices understanding of instructions given.  Artist Pais, MS, ACSM CEP

## 2019-07-31 ENCOUNTER — Other Ambulatory Visit: Payer: Self-pay

## 2019-07-31 ENCOUNTER — Encounter (HOSPITAL_COMMUNITY)
Admission: RE | Admit: 2019-07-31 | Discharge: 2019-07-31 | Disposition: A | Discharge status: Home / Self Care | Payer: BC Managed Care – PPO | Source: Ambulatory Visit | Attending: Cardiology | Admitting: Cardiology

## 2019-07-31 DIAGNOSIS — Z955 Presence of coronary angioplasty implant and graft: Secondary | ICD-10-CM | POA: Diagnosis not present

## 2019-07-31 DIAGNOSIS — I2102 ST elevation (STEMI) myocardial infarction involving left anterior descending coronary artery: Secondary | ICD-10-CM

## 2019-08-02 ENCOUNTER — Encounter (HOSPITAL_COMMUNITY)
Admission: RE | Admit: 2019-08-02 | Discharge: 2019-08-02 | Disposition: A | Discharge status: Home / Self Care | Payer: BC Managed Care – PPO | Source: Ambulatory Visit | Attending: Cardiology | Admitting: Cardiology

## 2019-08-02 ENCOUNTER — Other Ambulatory Visit: Payer: Self-pay

## 2019-08-02 DIAGNOSIS — I2102 ST elevation (STEMI) myocardial infarction involving left anterior descending coronary artery: Secondary | ICD-10-CM

## 2019-08-02 DIAGNOSIS — Z955 Presence of coronary angioplasty implant and graft: Secondary | ICD-10-CM | POA: Diagnosis not present

## 2019-08-05 ENCOUNTER — Other Ambulatory Visit: Payer: Self-pay

## 2019-08-05 ENCOUNTER — Encounter (HOSPITAL_COMMUNITY)
Admission: RE | Admit: 2019-08-05 | Discharge: 2019-08-05 | Disposition: A | Discharge status: Home / Self Care | Payer: BC Managed Care – PPO | Source: Ambulatory Visit | Attending: Cardiology | Admitting: Cardiology

## 2019-08-05 DIAGNOSIS — I2102 ST elevation (STEMI) myocardial infarction involving left anterior descending coronary artery: Secondary | ICD-10-CM | POA: Diagnosis not present

## 2019-08-05 DIAGNOSIS — Z955 Presence of coronary angioplasty implant and graft: Secondary | ICD-10-CM

## 2019-08-05 NOTE — Progress Notes (Signed)
Cardiac Rehab Note:  While utilizing hand weights post 30 min aerobic exercise, patient c/o "I can't breath". Pale, dizzy, extreme diaphoresis. "I need to lay down". BP unattainable via electronic cuff. RR called. Patient assisted to stretcher with feet elevated. Automatic BP 97/67 supine. Patient cooled with ice pack to neck and forehead. States "I feel better now". Legs lowered, patient assisted to a sitting position, standing position, and slowly ambulated with assistance over the next 20 min. Provided H2O for rehydration. Denied complaints of chest discomfort. Sequential BP readings as followed: 109/70, 111/79, 100/67, 100/66. All symptoms of hypotension resolved. Patient discharged home in stable condition after exercise counseling including symptom reporting and endpoints to exercise. Patient admits "I pushed myself to hard and should have sat down instead of doing weights". He is encouraged to re-hydrate today and limit his exertional activities today that will cause dehydration.   Plan: will follow up with patient later today via telephone  Connelly [place] E. Suzie Portela RN, BSN [place]. [facility]  Cardiac and Pulmonary [facility] Phone: [phone] Fax: [phone]

## 2019-08-06 NOTE — Progress Notes (Signed)
Cardiac Individual Treatment Plan  Patient Details  Name: [name] [name] MRN: [id number] Date of Birth: 27-Feb-1965 Referring Provider:     CARDIAC REHAB PHASE II ORIENTATION from 07/18/2019 in [place]  Referring Provider Leonie Man.      Initial Encounter Date:    CARDIAC REHAB PHASE II ORIENTATION from 07/18/2019 in [place]  Date 07/18/19      Visit Diagnosis: ST elevation myocardial infarction involving left anterior descending (LAD) coronary artery ([place]) 05/29/19  S/P DES LAD 05/29/19  Patient's Home Medications on Admission:  Current Outpatient Medications:  .  aspirin 81 MG chewable tablet, Chew 1 tablet (81 mg total) by mouth daily., Disp: 30 tablet, Rfl: 6 .  atorvastatin (LIPITOR) 80 MG tablet, Take 1 tablet (80 mg total) by mouth daily at 6 PM., Disp: 180 tablet, Rfl: 3 .  carvedilol (COREG) 6.25 MG tablet, Take 1 tablet (6.25 mg total) by mouth 2 (two) times daily with a meal., Disp: 180 tablet, Rfl: 3 .  cetirizine (ZYRTEC) 10 MG tablet, Take 10 mg by mouth daily as needed for allergies. Takes every 2-3 days, Disp: , Rfl:  .  losartan (COZAAR) 25 MG tablet, Take 0.5 tablets (12.5 mg total) by mouth daily., Disp: 45 tablet, Rfl: 3 .  nitroGLYCERIN (NITROSTAT) 0.4 MG SL tablet, Place 1 tablet (0.4 mg total) under the tongue every 5 (five) minutes as needed for chest pain (up to 3 doses. If taking 3rd dose, call 911.). (Patient not taking: Reported on 07/10/2019), Disp: 25 tablet, Rfl: 3 .  ticagrelor (BRILINTA) 90 MG TABS tablet, Take 1 tablet (90 mg total) by mouth 2 (two) times daily., Disp: 180 tablet, Rfl: 3  Past Medical History: Past Medical History:  Diagnosis Date  . Hyperlipidemia LDL goal <70   . STEMI (ST elevation myocardial infarction) ([place]) 05/29/2019    Tobacco Use: Social History   Tobacco Use  Smoking Status Never Smoker  Smokeless Tobacco Never Used    Labs: Recent Review  Scientist, physiological    Labs for ITP Cardiac and Pulmonary Rehab Latest Ref Rng & Units 05/29/2019   Cholestrol 0 - 200 mg/dL 169   LDLCALC 0 - 99 mg/dL 126(H)   HDL >40 mg/dL 30(L)   Trlycerides <150 mg/dL 65   Hemoglobin A1c 4.8 - 5.6 % 5.6   TCO2 22 - 32 mmol/L 24      Capillary Blood Glucose: No results found for: GLUCAP   Exercise Target Goals: Exercise Program Goal: Individual exercise prescription set using results from initial 6 min walk test and THRR while considering  patient's activity barriers and safety.   Exercise Prescription Goal: Initial exercise prescription builds to 30-45 minutes a day of aerobic activity, 2-3 days per week.  Home exercise guidelines will be given to patient during program as part of exercise prescription that the participant will acknowledge.  Activity Barriers & Risk Stratification:  Activity Barriers & Cardiac Risk Stratification - 07/18/19 1010      Activity Barriers & Cardiac Risk Stratification   Activity Barriers None    Cardiac Risk Stratification High           6 Minute Walk:  6 Minute Walk    Row Name 07/18/19 0857         6 Minute Walk   Distance 2000 feet     Walk Time 6 minutes     # of Rest Breaks 0  MPH 3.78     METS 5.05     RPE 10     Perceived Dyspnea  0     VO2 Peak 17.7     Symptoms No     Resting HR 79 bpm     Resting BP 98/60     Resting Oxygen Saturation  97 %     Exercise Oxygen Saturation  during 6 min walk 96 %     Max Ex. HR 107 bpm     Max Ex. BP 122/68     2 Minute Post BP 108/62            Oxygen Initial Assessment:   Oxygen Re-Evaluation:   Oxygen Discharge (Final Oxygen Re-Evaluation):   Initial Exercise Prescription:  Initial Exercise Prescription - 07/18/19 1100      Date of Initial Exercise RX and Referring Provider   Date 07/18/19    Referring Provider Marykay Lex.    Expected Discharge Date 09/13/19      Bike   Level 2    Watts 35    Minutes 15    METs 2.4        NuStep   Level 3    SPM 60    Minutes 15    METs 2.5      Prescription Details   Frequency (times per week) 3    Duration Progress to 30 minutes of continuous aerobic without signs/symptoms of physical distress      Intensity   THRR 40-80% of Max Heartrate 67 -134    Ratings of Perceived Exertion 11-13    Perceived Dyspnea 0-4      Resistance Training   Training Prescription Yes    Weight 4    Reps 10-15           Perform Capillary Blood Glucose checks as needed.  Exercise Prescription Changes:  Exercise Prescription Changes    Row Name 07/22/19 0705 07/29/19 0701           Response to Exercise   Blood Pressure (Admit) 102/70 106/64      Blood Pressure (Exercise) 140/80 110/68      Blood Pressure (Exit) 99/66 104/58      Heart Rate (Admit) 59 bpm 68 bpm      Heart Rate (Exercise) 134 bpm 129 bpm      Heart Rate (Exit) 71 bpm 75 bpm      Rating of Perceived Exertion (Exercise) 14 11      Symptoms none none      Comments Off to a great start with exercise.  --      Duration Continue with 30 min of aerobic exercise without signs/symptoms of physical distress. Continue with 30 min of aerobic exercise without signs/symptoms of physical distress.      Intensity THRR unchanged THRR unchanged        Progression   Progression Continue to progress workloads to maintain intensity without signs/symptoms of physical distress. Continue to progress workloads to maintain intensity without signs/symptoms of physical distress.      Average METs 5.9 5.8        Resistance Training   Training Prescription Yes Yes      Weight 4 4      Reps 10-15 10-15      Time 10 Minutes 10 Minutes        Interval Training   Interval Training No No        Bike   Level 4 4  Minutes 15 15      METs 7 7        NuStep   Level 3 4      SPM 85 85      Minutes 15 15      METs 4.7 4.6        Home Exercise Plan   Plans to continue exercise at -- Home (comment)  Walking      Frequency  -- Add 2 additional days to program exercise sessions.      Initial Home Exercises Provided -- 07/29/19             Exercise Comments:  Exercise Comments    Row Name 07/22/19 0803 07/29/19 0722 07/31/19 0708       Exercise Comments Patient tolerated 1st session of exercise well without symptoms. Reviewed home exercise, METs, and goals with patient. Reviewed METs with patient.            Exercise Goals and Review:  Exercise Goals    Row Name 07/18/19 1010             Exercise Goals   Increase Physical Activity Yes       Intervention Provide advice, education, support and counseling about physical activity/exercise needs.;Develop an individualized exercise prescription for aerobic and resistive training based on initial evaluation findings, risk stratification, comorbidities and participant's personal goals.       Expected Outcomes Short Term: Attend rehab on a regular basis to increase amount of physical activity.;Long Term: Add in home exercise to make exercise part of routine and to increase amount of physical activity.;Long Term: Exercising regularly at least 3-5 days a week.       Increase Strength and Stamina Yes       Intervention Provide advice, education, support and counseling about physical activity/exercise needs.;Develop an individualized exercise prescription for aerobic and resistive training based on initial evaluation findings, risk stratification, comorbidities and participant's personal goals.       Expected Outcomes Short Term: Increase workloads from initial exercise prescription for resistance, speed, and METs.;Short Term: Perform resistance training exercises routinely during rehab and add in resistance training at home;Long Term: Improve cardiorespiratory fitness, muscular endurance and strength as measured by increased METs and functional capacity ( )       Able to understand and use rate of perceived exertion (RPE) scale Yes       Intervention Provide  education and explanation on how to use RPE scale       Expected Outcomes Short Term: Able to use RPE daily in rehab to express subjective intensity level;Long Term:  Able to use RPE to guide intensity level when exercising independently       Knowledge and understanding of Target Heart Rate Range (THRR) Yes       Intervention Provide education and explanation of THRR including how the numbers were predicted and where they are located for reference       Expected Outcomes Short Term: Able to state/look up THRR;Short Term: Able to use daily as guideline for intensity in rehab;Long Term: Able to use THRR to govern intensity when exercising independently       Able to check pulse independently Yes       Intervention Provide education and demonstration on how to check pulse in carotid and radial arteries.;Review the importance of being able to check your own pulse for safety during independent exercise       Expected Outcomes Short Term: Able to explain why  pulse checking is important during independent exercise;Long Term: Able to check pulse independently and accurately       Understanding of Exercise Prescription Yes       Intervention Provide education, explanation, and written materials on patient's individual exercise prescription       Expected Outcomes Short Term: Able to explain program exercise prescription;Long Term: Able to explain home exercise prescription to exercise independently              Exercise Goals Re-Evaluation :  Exercise Goals Re-Evaluation    Row Name 07/22/19 0803 07/29/19 0722           Exercise Goal Re-Evaluation   Exercise Goals Review Increase Physical Activity;Able to understand and use rate of perceived exertion (RPE) scale Increase Physical Activity;Able to understand and use rate of perceived exertion (RPE) scale;Increase Strength and Stamina;Able to check pulse independently;Knowledge and understanding of Target Heart Rate Range (THRR);Understanding of  Exercise Prescription      Comments Patient able to understand and use RPE scale appropriately. Reviewed home exercise guidelines with patient including endpoints, temperature precautions, target heart rate and rate of perceived exertion. Pt is walking 15 minutes, 2 days/week as his mode of home exercise. Discussed increasing exercise duration either to 15 minutes, twice/day or increase to 30 minutes once/day, and patient is amenable to this. Patient has a smart watch to monitor his heart rate. Pt voices understanding of instructions given.      Expected Outcomes Increase workloads as tolerated to help improve cardiorespiratory fitness. Patient will increase walking duration at home from 15 minutes 2 days/week to 30 minutes 2 days/week in addition to exercise at cardiac rehab.             Discharge Exercise Prescription (Final Exercise Prescription Changes):  Exercise Prescription Changes - 07/29/19 0701      Response to Exercise   Blood Pressure (Admit) 106/64    Blood Pressure (Exercise) 110/68    Blood Pressure (Exit) 104/58    Heart Rate (Admit) 68 bpm    Heart Rate (Exercise) 129 bpm    Heart Rate (Exit) 75 bpm    Rating of Perceived Exertion (Exercise) 11    Symptoms none    Duration Continue with 30 min of aerobic exercise without signs/symptoms of physical distress.    Intensity THRR unchanged      Progression   Progression Continue to progress workloads to maintain intensity without signs/symptoms of physical distress.    Average METs 5.8      Resistance Training   Training Prescription Yes    Weight 4    Reps 10-15    Time 10 Minutes      Interval Training   Interval Training No      Bike   Level 4    Minutes 15    METs 7      NuStep   Level 4    SPM 85    Minutes 15    METs 4.6      Home Exercise Plan   Plans to continue exercise at Home (comment)   Walking   Frequency Add 2 additional days to program exercise sessions.    Initial Home Exercises Provided  07/29/19           Nutrition:  Target Goals: Understanding of nutrition guidelines, daily intake of sodium 1500mg , cholesterol 200mg , calories 30% from fat and 7% or less from saturated fats, daily to have 5 or more servings of fruits and vegetables.  Biometrics:  Pre Biometrics - 07/18/19 1011      Pre Biometrics   Height  (1.702 m)    Weight 78.9 kg    Waist Circumference 38.25 inches    Hip Circumference 39.25 inches    Waist to Hip Ratio 0.97 %    BMI (Calculated) 27.24    Triceps Skinfold 11 mm    % Body Fat 24.9 %    Grip Strength 52 kg    Flexibility 14 in    Single Leg Stand 120 seconds            Nutrition Therapy Plan and Nutrition Goals:  Nutrition Therapy & Goals - 07/26/19 1026      Nutrition Therapy   Diet Heart Healthy      Personal Nutrition Goals   Nutrition Goal Pt to build a healthy plate including vegetables, fruits, whole grains, and low-fat dairy products in a heart healthy meal plan.    Personal Goal #2 Pt to eat a variety of non-starchy vegetables and fruits      Intervention Plan   Intervention Nutrition handout(s) given to patient.;Prescribe, educate and counsel regarding individualized specific dietary modifications aiming towards targeted core components such as weight, hypertension, lipid management, diabetes, heart failure and other comorbidities.    Expected Outcomes Short Term Goal: A plan has been developed with personal nutrition goals set during dietitian appointment.           Nutrition Assessments:  Nutrition Assessments - 07/26/19 1026      MEDFICTS Scores   Pre Score 33           Nutrition Goals Re-Evaluation:  Nutrition Goals Re-Evaluation    Row Name 07/26/19 1026             Goals   Current Weight 173 lb (78.5 kg)              Nutrition Goals Re-Evaluation:  Nutrition Goals Re-Evaluation    Row Name 07/26/19 1026             Goals   Current Weight 173 lb (78.5 kg)               Nutrition Goals Discharge (Final Nutrition Goals Re-Evaluation):  Nutrition Goals Re-Evaluation - 07/26/19 1026      Goals   Current Weight 173 lb (78.5 kg)           Psychosocial: Target Goals: Acknowledge presence or absence of significant depression and/or stress, maximize coping skills, provide positive support system. Participant is able to verbalize types and ability to use techniques and skills needed for reducing stress and depression.  Initial Review & Psychosocial Screening:  Initial Psych Review & Screening - 07/18/19 1258      Initial Review   Current issues with None Identified      Family Dynamics   Good Support System? Yes   [name] has his wife and children for support     Barriers   Psychosocial barriers to participate in program There are no identifiable barriers or psychosocial needs.      Screening Interventions   Interventions Encouraged to exercise           Quality of Life Scores:  Quality of Life - 07/18/19 1001      Quality of Life   Select Quality of Life      Quality of Life Scores   Health/Function Pre 31.4 %    Socioeconomic Pre 19.14 %    Psych/Spiritual Pre  19.86 %    Family Pre 24.9 %    GLOBAL Pre 25.54 %          Scores of 19 and below usually indicate a poorer quality of life in these areas.  A difference of  2-3 points is a clinically meaningful difference.  A difference of 2-3 points in the total score of the Quality of Life Index has been associated with significant improvement in overall quality of life, self-image, physical symptoms, and general health in studies assessing change in quality of life.  PHQ-9: Recent Review Flowsheet Data    Dep[facility]   Decreased Interest 0   Down, Depressed, Hopeless 0   PHQ - 2 Score 0     Interpretation of Total Score  Total Score Depression Severity:  1-4 = Minimal depression, 5-9 = Mild depression, 10-14 = Moderate depression, 15-19 = Moderately severe  depression, 20-27 = Severe depression   Psychosocial Evaluation and Intervention:  Psychosocial Evaluation - 07/22/19 1038      Psychosocial Evaluation & Interventions   Interventions Encouraged to exercise with the program and follow exercise prescription    Comments Mr. Hyacinth MeekerMiller continues to have a positive outlook and attitude. He denies any psychosocial barriers to participation in cardiac rehab or self health management. He enjoys cars and walking trails as a stress reducer and hobbie.    Expected Outcomes Patient will continue to have a positive outlook and attitude. He will utilize his support system as needed.    Continue Psychosocial Services  No Follow up required           Psychosocial Re-Evaluation:  Psychosocial Re-Evaluation    Row Name 08/06/19 1229             Psychosocial Re-Evaluation   Comments Mr. Hyacinth MeekerMiller continues to have a positive outlook and attitude. He denies any psychosocial barriers to participation in cardiac rehab or self health management. He enjoys cars and walking trails as a stress reducer and hobbie.       Expected Outcomes Patient will continue to have a positive outlook and attitude. He will utilize his support system as needed.       Interventions Encouraged to attend Cardiac Rehabilitation for the exercise       Continue Psychosocial Services  No Follow up required              Psychosocial Discharge (Final Psychosocial Re-Evaluation):  Psychosocial Re-Evaluation - 08/06/19 1229      Psychosocial Re-Evaluation   Comments Mr. Hyacinth MeekerMiller continues to have a positive outlook and attitude. He denies any psychosocial barriers to participation in cardiac rehab or self health management. He enjoys cars and walking trails as a stress reducer and hobbie.    Expected Outcomes Patient will continue to have a positive outlook and attitude. He will utilize his support system as needed.    Interventions Encouraged to attend Cardiac Rehabilitation for the  exercise    Continue Psychosocial Services  No Follow up required           Vocational Rehabilitation: Provide vocational rehab assistance to qualifying candidates.   Vocational Rehab Evaluation & Intervention:  Vocational Rehab - 07/18/19 1300      Initial Vocational Rehab Evaluation & Intervention   Assessment shows need for Vocational Rehabilitation No   Mr Hyacinth MeekerMiller works full time and does not need vocational rehab at this time          Education: Education Goals: Education classes will be  provided on a weekly basis, covering required topics. Participant will state understanding/return demonstration of topics presented.  Learning Barriers/Preferences:  Learning Barriers/Preferences - 07/18/19 1135      Learning Barriers/Preferences   Learning Barriers None    Learning Preferences Audio;Verbal Instruction           Education Topics: Count Your Pulse:  -Group instruction provided by verbal instruction, demonstration, patient participation and written materials to support subject.  Instructors address importance of being able to find your pulse and how to count your pulse when at home without a heart monitor.  Patients get hands on experience counting their pulse with staff help and individually.   Heart Attack, Angina, and Risk Factor Modification:  -Group instruction provided by verbal instruction, video, and written materials to support subject.  Instructors address signs and symptoms of angina and heart attacks.    Also discuss risk factors for heart disease and how to make changes to improve heart health risk factors.   Functional Fitness:  -Group instruction provided by verbal instruction, demonstration, patient participation, and written materials to support subject.  Instructors address safety measures for doing things around the house.  Discuss how to get up and down off the floor, how to pick things up properly, how to safely get out of a chair without assistance,  and balance training.   Meditation and Mindfulness:  -Group instruction provided by verbal instruction, patient participation, and written materials to support subject.  Instructor addresses importance of mindfulness and meditation practice to help reduce stress and improve awareness.  Instructor also leads participants through a meditation exercise.    Stretching for Flexibility and Mobility:  -Group instruction provided by verbal instruction, patient participation, and written materials to support subject.  Instructors lead participants through series of stretches that are designed to increase flexibility thus improving mobility.  These stretches are additional exercise for major muscle groups that are typically performed during regular warm up and cool down.   Hands Only CPR:  -Group verbal, video, and participation provides a basic overview of AHA guidelines for community CPR. Role-play of emergencies allow participants the opportunity to practice calling for help and chest compression technique with discussion of AED use.   Hypertension: -Group verbal and written instruction that provides a basic overview of hypertension including the most recent diagnostic guidelines, risk factor reduction with self-care instructions and medication management.    Nutrition I class: Heart Healthy Eating:  -Group instruction provided by PowerPoint slides, verbal discussion, and written materials to support subject matter. The instructor gives an explanation and review of the Therapeutic Lifestyle Changes diet recommendations, which includes a discussion on lipid goals, dietary fat, sodium, fiber, plant stanol/sterol esters, sugar, and the components of a well-balanced, healthy diet.   Nutrition II class: Lifestyle Skills:  -Group instruction provided by PowerPoint slides, verbal discussion, and written materials to support subject matter. The instructor gives an explanation and review of label reading,  grocery shopping for heart health, heart healthy recipe modifications, and ways to make healthier choices when eating out.   Diabetes Question & Answer:  -Group instruction provided by PowerPoint slides, verbal discussion, and written materials to support subject matter. The instructor gives an explanation and review of diabetes co-morbidities, pre- and post-prandial blood glucose goals, pre-exercise blood glucose goals, signs, symptoms, and treatment of hypoglycemia and hyperglycemia, and foot care basics.   Diabetes Blitz:  -Group instruction provided by PowerPoint slides, verbal discussion, and written materials to support subject matter. The instructor gives an  explanation and review of the physiology behind type 1 and type 2 diabetes, diabetes medications and rational behind using different medications, pre- and post-prandial blood glucose recommendations and Hemoglobin A1c goals, diabetes diet, and exercise including blood glucose guidelines for exercising safely.    Portion Distortion:  -Group instruction provided by PowerPoint slides, verbal discussion, written materials, and food models to support subject matter. The instructor gives an explanation of serving size versus portion size, changes in portions sizes over the last 20 years, and what consists of a serving from each food group.   Stress Management:  -Group instruction provided by verbal instruction, video, and written materials to support subject matter.  Instructors review role of stress in heart disease and how to cope with stress positively.     Exercising on Your Own:  -Group instruction provided by verbal instruction, power point, and written materials to support subject.  Instructors discuss benefits of exercise, components of exercise, frequency and intensity of exercise, and end points for exercise.  Also discuss use of nitroglycerin and activating EMS.  Review options of places to exercise outside of rehab.  Review  guidelines for sex with heart disease.   Cardiac Drugs I:  -Group instruction provided by verbal instruction and written materials to support subject.  Instructor reviews cardiac drug classes: antiplatelets, anticoagulants, beta blockers, and statins.  Instructor discusses reasons, side effects, and lifestyle considerations for each drug class.   Cardiac Drugs II:  -Group instruction provided by verbal instruction and written materials to support subject.  Instructor reviews cardiac drug classes: angiotensin converting enzyme inhibitors (ACE-I), angiotensin II receptor blockers (ARBs), nitrates, and calcium channel blockers.  Instructor discusses reasons, side effects, and lifestyle considerations for each drug class.   Anatomy and Physiology of the Circulatory System:  Group verbal and written instruction and models provide basic cardiac anatomy and physiology, with the coronary electrical and arterial systems. Review of: AMI, Angina, Valve disease, Heart Failure, Peripheral Artery Disease, Cardiac Arrhythmia, Pacemakers, and the ICD.   Other Education:  -Group or individual verbal, written, or video instructions that support the educational goals of the cardiac rehab program.   Holiday Eating Survival Tips:  -Group instruction provided by PowerPoint slides, verbal discussion, and written materials to support subject matter. The instructor gives patients tips, tricks, and techniques to help them not only survive but enjoy the holidays despite the onslaught of food that accompanies the holidays.   Knowledge Questionnaire Score:  Knowledge Questionnaire Score - 07/18/19 1001      Knowledge Questionnaire Score   Pre Score 23/24           Core Components/Risk Factors/Patient Goals at Admission:  Personal Goals and Risk Factors at Admission - 07/18/19 1131      Core Components/Risk Factors/Patient Goals on Admission    Weight Management Weight Loss;Yes    Intervention Weight  Management: Provide education and appropriate resources to help participant work on and attain dietary goals.;Weight Management: Develop a combined nutrition and exercise program designed to reach desired caloric intake, while maintaining appropriate intake of nutrient and fiber, sodium and fats, and appropriate energy expenditure required for the weight goal.    Admit Weight 173 lb 15.1 oz (78.9 kg)    Goal Weight: Short Term 5 lb (2.268 kg)    Expected Outcomes Short Term: Continue to assess and modify interventions until short term weight is achieved;Long Term: Adherence to nutrition and physical activity/exercise program aimed toward attainment of established weight goal;Weight Loss: Understanding of general recommendations  for a balanced deficit meal plan, which promotes 1-2 lb weight loss per week and includes a negative energy balance of [phone] kcal/d;Understanding recommendations for meals to include 15-35% energy as protein, 25-35% energy from fat, 35-60% energy from carbohydrates, less than 200mg  of dietary cholesterol, 20-35 gm of total fiber daily;Understanding of distribution of calorie intake throughout the day with the consumption of 4-5 meals/snacks    Lipids Yes    Intervention Provide education and support for participant on nutrition & aerobic/resistive exercise along with prescribed medications to achieve LDL 70mg , HDL >40mg .    Expected Outcomes Short Term: Participant states understanding of desired cholesterol values and is compliant with medications prescribed. Participant is following exercise prescription and nutrition guidelines.;Long Term: Cholesterol controlled with medications as prescribed, with individualized exercise RX and with personalized nutrition plan. Value goals: LDL < 70mg , HDL > 40 mg.           Core Components/Risk Factors/Patient Goals Review:   Goals and Risk Factor Review    Row Name 07/23/19 1048 08/06/19 1229           Core Components/Risk  Factors/Patient Goals Review   Personal Goals Review Weight Management/Obesity;Lipids Weight Management/Obesity;Lipids      Review Mr. Isakson has several CAD risk factors. His goals are to participate in CR exercise to make sure he is able to safely exercise at home. He hopes to walk trails again and to get past pre-heart attack activities. Mr. Pumphrey has several CAD risk factors. His goals are to participate in CR exercise to make sure he is able to safely exercise at home. He hopes to walk trails again and to get past pre-heart attack activities.      Expected Outcomes Mr. Demartini will continue to participate in CR Mr. Clemence will continue to participate in CR for risk factor modification and education including endpoints to exercise             Core Components/Risk Factors/Patient Goals at Discharge (Final Review):   Goals and Risk Factor Review - 08/06/19 1229      Core Components/Risk Factors/Patient Goals Review   Personal Goals Review Weight Management/Obesity;Lipids    Review Mr. Schmid has several CAD risk factors. His goals are to participate in CR exercise to make sure he is able to safely exercise at home. He hopes to walk trails again and to get past pre-heart attack activities.    Expected Outcomes Mr. Prater will continue to participate in CR for risk factor modification and education including endpoints to exercise           ITP Comments:  ITP Comments    Row Name 07/18/19 0834 07/22/19 1037 08/06/19 1221       ITP Comments Dr. 09/21/19 MD, Medical Director Mr. Portner completed his first cardiac rehab exercise session today and tolerated very well. Denied complaints VSS. Worked to an RPE of 11-14 with mets 4.7-7.0. 30 day ITP review: Mr Teichert has completed 7 exercise sessions since admission. At his last exercise session he c/o dizziness, diaphoresis, and shortness of breath. Rapid responce was called for symptomatic hypotension. He was treated with rehydrations and  discharged in stable condition. RN/EP reinforced endpoints to exercise as patient states he was symptomatic but continues to exercise. He is strongly encouraged and educated on the importance of not working beyond an RPE of 13. He pushes himself to mets of >7.0. Will continue to monitor progression towards goals of learning limits to exercise and how to exercise  safely.             Comments: see ITP comments

## 2019-08-07 ENCOUNTER — Encounter (HOSPITAL_COMMUNITY)
Admission: RE | Admit: 2019-08-07 | Discharge: 2019-08-07 | Disposition: A | Discharge status: Home / Self Care | Payer: BC Managed Care – PPO | Source: Ambulatory Visit | Attending: Cardiology | Admitting: Cardiology

## 2019-08-07 ENCOUNTER — Other Ambulatory Visit: Payer: Self-pay

## 2019-08-07 DIAGNOSIS — I2102 ST elevation (STEMI) myocardial infarction involving left anterior descending coronary artery: Secondary | ICD-10-CM

## 2019-08-07 DIAGNOSIS — Z955 Presence of coronary angioplasty implant and graft: Secondary | ICD-10-CM | POA: Diagnosis not present

## 2019-08-09 ENCOUNTER — Encounter (HOSPITAL_COMMUNITY)
Admission: RE | Admit: 2019-08-09 | Discharge: 2019-08-09 | Disposition: A | Discharge status: Home / Self Care | Payer: BC Managed Care – PPO | Source: Ambulatory Visit | Attending: Cardiology | Admitting: Cardiology

## 2019-08-09 ENCOUNTER — Other Ambulatory Visit: Payer: Self-pay

## 2019-08-09 DIAGNOSIS — Z955 Presence of coronary angioplasty implant and graft: Secondary | ICD-10-CM

## 2019-08-09 DIAGNOSIS — I2102 ST elevation (STEMI) myocardial infarction involving left anterior descending coronary artery: Secondary | ICD-10-CM

## 2019-08-12 ENCOUNTER — Telehealth (HOSPITAL_COMMUNITY): Payer: Self-pay | Admitting: *Deleted

## 2019-08-12 ENCOUNTER — Encounter (HOSPITAL_COMMUNITY): Payer: BC Managed Care – PPO

## 2019-08-14 ENCOUNTER — Other Ambulatory Visit: Payer: Self-pay

## 2019-08-14 ENCOUNTER — Encounter (HOSPITAL_COMMUNITY)
Admission: RE | Admit: 2019-08-14 | Discharge: 2019-08-14 | Disposition: A | Discharge status: Home / Self Care | Payer: BC Managed Care – PPO | Source: Ambulatory Visit | Attending: Cardiology | Admitting: Cardiology

## 2019-08-14 DIAGNOSIS — I2102 ST elevation (STEMI) myocardial infarction involving left anterior descending coronary artery: Secondary | ICD-10-CM

## 2019-08-14 DIAGNOSIS — Z955 Presence of coronary angioplasty implant and graft: Secondary | ICD-10-CM

## 2019-08-16 ENCOUNTER — Encounter (HOSPITAL_COMMUNITY)
Admission: RE | Admit: 2019-08-16 | Discharge: 2019-08-16 | Disposition: A | Discharge status: Home / Self Care | Payer: BC Managed Care – PPO | Source: Ambulatory Visit | Attending: Cardiology | Admitting: Cardiology

## 2019-08-16 ENCOUNTER — Other Ambulatory Visit: Payer: Self-pay

## 2019-08-16 DIAGNOSIS — Z955 Presence of coronary angioplasty implant and graft: Secondary | ICD-10-CM | POA: Diagnosis not present

## 2019-08-16 DIAGNOSIS — I2102 ST elevation (STEMI) myocardial infarction involving left anterior descending coronary artery: Secondary | ICD-10-CM | POA: Insufficient documentation

## 2019-08-21 ENCOUNTER — Encounter (HOSPITAL_COMMUNITY)
Admission: RE | Admit: 2019-08-21 | Discharge: 2019-08-21 | Disposition: A | Discharge status: Home / Self Care | Payer: BC Managed Care – PPO | Source: Ambulatory Visit | Attending: Cardiology | Admitting: Cardiology

## 2019-08-21 ENCOUNTER — Other Ambulatory Visit: Payer: Self-pay

## 2019-08-21 DIAGNOSIS — I2102 ST elevation (STEMI) myocardial infarction involving left anterior descending coronary artery: Secondary | ICD-10-CM

## 2019-08-21 DIAGNOSIS — Z955 Presence of coronary angioplasty implant and graft: Secondary | ICD-10-CM

## 2019-08-21 NOTE — Progress Notes (Signed)
Nutrition Note - Follow Up  Spoke with patient today about lipid management. Reviewed lipid panel. Discussed diet changes for out of range values. Reviewed heart healthy diet. Daily recommendation of 28-40 g fiber, <16 g saturated fat, 0 g trans fat, and 1500 (to 2000) mg sodium.Recommended eating pattern of lean protein at every meal or snack, 2 servings fatty fish per week, 3-5 servings non starchy vegetables per day, 2-3 servings fruit per day, 1 oz nuts per day. Provided handouts and recipes. Collaborated with pt for goal setting.  Pt has continued to reduce red meats/processed meats and incorporate whole grains. We reviewed veggies (starchy and non starchy)he does enjoy and goal to incorporate green beans, lima beans, and black eyed peas.  He still wants to try smoothies and has bought the ingredients to try.   Pt verbalized understanding.    Nutrition Diagnosis   Inappropriate intake of saturated fats related excessive consumption of fatty meats and processed foods as evidenced by pt's diet recall and LDL value of 126 mg/dl  Nutrition Intervention   Pt's individual nutrition plan reviewed with pt.  Benefits of adopting Heart Healthy diet discussed when Med[employer] reviewed.                  Continue client-centered nutrition education by RD, as part of interdisciplinary care.  Goal(s)  Pt to build a healthy plate including vegetables, fruits, whole grains, and low-fat dairy products in a heart healthy meal plan.  Pt to eat a variety of non-starchy vegetables and fruits  Plan:  Will provide client-centered nutrition education as part of interdisciplinary care  Monitor and evaluate progress toward nutrition goal with team.    Andrey Campanile, MS, RDN, LDN

## 2019-08-23 ENCOUNTER — Other Ambulatory Visit: Payer: Self-pay

## 2019-08-23 ENCOUNTER — Encounter (HOSPITAL_COMMUNITY)
Admission: RE | Admit: 2019-08-23 | Discharge: 2019-08-23 | Disposition: A | Discharge status: Home / Self Care | Payer: BC Managed Care – PPO | Source: Ambulatory Visit | Attending: Cardiology | Admitting: Cardiology

## 2019-08-23 DIAGNOSIS — Z955 Presence of coronary angioplasty implant and graft: Secondary | ICD-10-CM | POA: Diagnosis not present

## 2019-08-23 DIAGNOSIS — I2102 ST elevation (STEMI) myocardial infarction involving left anterior descending coronary artery: Secondary | ICD-10-CM | POA: Diagnosis not present

## 2019-08-26 ENCOUNTER — Encounter (HOSPITAL_COMMUNITY)
Admission: RE | Admit: 2019-08-26 | Discharge: 2019-08-26 | Disposition: A | Discharge status: Home / Self Care | Payer: BC Managed Care – PPO | Source: Ambulatory Visit | Attending: Cardiology | Admitting: Cardiology

## 2019-08-26 ENCOUNTER — Other Ambulatory Visit: Payer: Self-pay

## 2019-08-26 DIAGNOSIS — Z955 Presence of coronary angioplasty implant and graft: Secondary | ICD-10-CM

## 2019-08-26 DIAGNOSIS — I2102 ST elevation (STEMI) myocardial infarction involving left anterior descending coronary artery: Secondary | ICD-10-CM | POA: Diagnosis not present

## 2019-08-28 ENCOUNTER — Encounter (HOSPITAL_COMMUNITY): Payer: BC Managed Care – PPO

## 2019-08-30 ENCOUNTER — Encounter (HOSPITAL_COMMUNITY)
Admission: RE | Admit: 2019-08-30 | Discharge: 2019-08-30 | Disposition: A | Discharge status: Home / Self Care | Payer: BC Managed Care – PPO | Source: Ambulatory Visit | Attending: Cardiology | Admitting: Cardiology

## 2019-08-30 ENCOUNTER — Other Ambulatory Visit: Payer: Self-pay

## 2019-08-30 DIAGNOSIS — I2102 ST elevation (STEMI) myocardial infarction involving left anterior descending coronary artery: Secondary | ICD-10-CM | POA: Diagnosis not present

## 2019-08-30 DIAGNOSIS — Z955 Presence of coronary angioplasty implant and graft: Secondary | ICD-10-CM | POA: Diagnosis not present

## 2019-09-02 ENCOUNTER — Other Ambulatory Visit: Payer: Self-pay

## 2019-09-02 ENCOUNTER — Encounter (HOSPITAL_COMMUNITY)
Admission: RE | Admit: 2019-09-02 | Discharge: 2019-09-02 | Disposition: A | Discharge status: Home / Self Care | Payer: BC Managed Care – PPO | Source: Ambulatory Visit | Attending: Cardiology | Admitting: Cardiology

## 2019-09-02 DIAGNOSIS — Z955 Presence of coronary angioplasty implant and graft: Secondary | ICD-10-CM

## 2019-09-02 DIAGNOSIS — I2102 ST elevation (STEMI) myocardial infarction involving left anterior descending coronary artery: Secondary | ICD-10-CM

## 2019-09-02 NOTE — Progress Notes (Signed)
Cardiac Individual Treatment Plan  Patient Details  Name: Iverson Sees MRN: [id number] Date of Birth: 27-Feb-1965 Referring Provider:     CARDIAC REHAB PHASE II ORIENTATION from 07/18/2019 in [place]  Referring Provider Leonie Man.      Initial Encounter Date:    CARDIAC REHAB PHASE II ORIENTATION from 07/18/2019 in [place]  Date 07/18/19      Visit Diagnosis: ST elevation myocardial infarction involving left anterior descending (LAD) coronary artery ([place]) 05/29/19  S/P DES LAD 05/29/19  Patient's Home Medications on Admission:  Current Outpatient Medications:  .  aspirin 81 MG chewable tablet, Chew 1 tablet (81 mg total) by mouth daily., Disp: 30 tablet, Rfl: 6 .  atorvastatin (LIPITOR) 80 MG tablet, Take 1 tablet (80 mg total) by mouth daily at 6 PM., Disp: 180 tablet, Rfl: 3 .  carvedilol (COREG) 6.25 MG tablet, Take 1 tablet (6.25 mg total) by mouth 2 (two) times daily with a meal., Disp: 180 tablet, Rfl: 3 .  cetirizine (ZYRTEC) 10 MG tablet, Take 10 mg by mouth daily as needed for allergies. Takes every 2-3 days, Disp: , Rfl:  .  losartan (COZAAR) 25 MG tablet, Take 0.5 tablets (12.5 mg total) by mouth daily., Disp: 45 tablet, Rfl: 3 .  nitroGLYCERIN (NITROSTAT) 0.4 MG SL tablet, Place 1 tablet (0.4 mg total) under the tongue every 5 (five) minutes as needed for chest pain (up to 3 doses. If taking 3rd dose, call 911.). (Patient not taking: Reported on 07/10/2019), Disp: 25 tablet, Rfl: 3 .  ticagrelor (BRILINTA) 90 MG TABS tablet, Take 1 tablet (90 mg total) by mouth 2 (two) times daily., Disp: 180 tablet, Rfl: 3  Past Medical History: Past Medical History:  Diagnosis Date  . Hyperlipidemia LDL goal <70   . STEMI (ST elevation myocardial infarction) ([place]) 05/29/2019    Tobacco Use: Social History   Tobacco Use  Smoking Status Never Smoker  Smokeless Tobacco Never Used    Labs: Recent Review  Scientist, physiological    Labs for ITP Cardiac and Pulmonary Rehab Latest Ref Rng & Units 05/29/2019   Cholestrol 0 - 200 mg/dL 169   LDLCALC 0 - 99 mg/dL 126(H)   HDL >40 mg/dL 30(L)   Trlycerides <150 mg/dL 65   Hemoglobin A1c 4.8 - 5.6 % 5.6   TCO2 22 - 32 mmol/L 24      Capillary Blood Glucose: No results found for: GLUCAP   Exercise Target Goals: Exercise Program Goal: Individual exercise prescription set using results from initial 6 min walk test and THRR while considering  patient's activity barriers and safety.   Exercise Prescription Goal: Initial exercise prescription builds to 30-45 minutes a day of aerobic activity, 2-3 days per week.  Home exercise guidelines will be given to patient during program as part of exercise prescription that the participant will acknowledge.  Activity Barriers & Risk Stratification:  Activity Barriers & Cardiac Risk Stratification - 07/18/19 1010      Activity Barriers & Cardiac Risk Stratification   Activity Barriers None    Cardiac Risk Stratification High           6 Minute Walk:  6 Minute Walk    Row Name 07/18/19 0857         6 Minute Walk   Distance 2000 feet     Walk Time 6 minutes     # of Rest Breaks 0  MPH 3.78     METS 5.05     RPE 10     Perceived Dyspnea  0     VO2 Peak 17.7     Symptoms No     Resting HR 79 bpm     Resting BP 98/60     Resting Oxygen Saturation  97 %     Exercise Oxygen Saturation  during 6 min walk 96 %     Max Ex. HR 107 bpm     Max Ex. BP 122/68     2 Minute Post BP 108/62            Oxygen Initial Assessment:   Oxygen Re-Evaluation:   Oxygen Discharge (Final Oxygen Re-Evaluation):   Initial Exercise Prescription:  Initial Exercise Prescription - 07/18/19 1100      Date of Initial Exercise RX and Referring Provider   Date 07/18/19    Referring Provider Leonie Man.    Expected Discharge Date 09/13/19      Bike   Level 2    Watts 35    Minutes 15    METs 2.4        NuStep   Level 3    SPM 60    Minutes 15    METs 2.5      Prescription Details   Frequency (times per week) 3    Duration Progress to 30 minutes of continuous aerobic without signs/symptoms of physical distress      Intensity   THRR 40-80% of Max Heartrate 67 -134    Ratings of Perceived Exertion 11-13    Perceived Dyspnea 0-4      Resistance Training   Training Prescription Yes    Weight 4    Reps 10-15           Perform Capillary Blood Glucose checks as needed.  Exercise Prescription Changes:   Exercise Prescription Changes    Row Name 07/22/19 0705 07/29/19 0701 08/14/19 0702 08/26/19 0702       Response to Exercise   Blood Pressure (Admit) 102/70 106/64 100/76 92/64    Blood Pressure (Exercise) 140/80 110/68 130/78 110/64    Blood Pressure (Exit) 99/66 104/58 98/62 100/70    Heart Rate (Admit) 59 bpm 68 bpm 73 bpm 60 bpm    Heart Rate (Exercise) 134 bpm 129 bpm 116 bpm 134 bpm    Heart Rate (Exit) 71 bpm 75 bpm 73 bpm 66 bpm    Rating of Perceived Exertion (Exercise) _0 Symptoms none none none none    Comments Off to a great start with exercise.  -- -- --    Duration Continue with 30 min of aerobic exercise without signs/symptoms of physical distress. Continue with 30 min of aerobic exercise without signs/symptoms of physical distress. Continue with 30 min of aerobic exercise without signs/symptoms of physical distress. Continue with 30 min of aerobic exercise without signs/symptoms of physical distress.    Intensity THRR unchanged THRR unchanged THRR unchanged THRR unchanged      Progression   Progression Continue to progress workloads to maintain intensity without signs/symptoms of physical distress. Continue to progress workloads to maintain intensity without signs/symptoms of physical distress. Continue to progress workloads to maintain intensity without signs/symptoms of physical distress. Continue to progress workloads to maintain intensity  without signs/symptoms of physical distress.    Average METs 5.9 5.8 5.5 6.6      Resistance Training   Training Prescription Yes Yes  No Yes    Weight 4 4 -- 6lbs    Reps 10-15 10-15 -- 10-15    Time 10 Minutes 10 Minutes -- 10 Minutes      Interval Training   Interval Training No No No No      Bike   Level 4 4 -- 4    Minutes 15 15 -- 15    METs 7 7 -- 8.6      Recumbant Bike   Level -- -- 4 --    Minutes -- -- 15 --    METs -- -- 7.2 --      NuStep   Level _0 SPM 85 85 85 85    Minutes _1 METs 4.7 4.6 3.7 4.6      Home Exercise Plan   Plans to continue exercise at -- Home (comment)  Walking Home (comment)  Walking Home (comment)  Walking    Frequency -- Add 2 additional days to program exercise sessions. Add 2 additional days to program exercise sessions. Add 2 additional days to program exercise sessions.    Initial Home Exercises Provided -- 07/29/19 07/29/19 07/29/19           Exercise Comments:   Exercise Comments    Row Name 07/22/19 0803 07/29/19 0722 07/31/19 0708 08/14/19 0710 08/30/19 1093   Exercise Comments Patient tolerated 1st session of exercise well without symptoms. Reviewed home exercise, METs, and goals with patient. Reviewed METs with patient. Reviewed METs and goals with patient. Reviewed METs and goals with patient.          Exercise Goals and Review:   Exercise Goals    Row Name 07/18/19 1010             Exercise Goals   Increase Physical Activity Yes       Intervention Provide advice, education, support and counseling about physical activity/exercise needs.;Develop an individualized exercise prescription for aerobic and resistive training based on initial evaluation findings, risk stratification, comorbidities and participant's personal goals.       Expected Outcomes Short Term: Attend rehab on a regular basis to increase amount of physical activity.;Long Term: Add in home exercise to make exercise part of routine  and to increase amount of physical activity.;Long Term: Exercising regularly at least 3-5 days a week.       Increase Strength and Stamina Yes       Intervention Provide advice, education, support and counseling about physical activity/exercise needs.;Develop an individualized exercise prescription for aerobic and resistive training based on initial evaluation findings, risk stratification, comorbidities and participant's personal goals.       Expected Outcomes Short Term: Increase workloads from initial exercise prescription for resistance, speed, and METs.;Short Term: Perform resistance training exercises routinely during rehab and add in resistance training at home;Long Term: Improve cardiorespiratory fitness, muscular endurance and strength as measured by increased METs and functional capacity (6MWT)       Able to understand and use rate of perceived exertion (RPE) scale Yes       Intervention Provide education and explanation on how to use RPE scale       Expected Outcomes Short Term: Able to use RPE daily in rehab to express subjective intensity level;Long Term:  Able to use RPE to guide intensity level when exercising independently       Knowledge and understanding of Target Heart Rate Range (THRR) Yes  Intervention Provide education and explanation of THRR including how the numbers were predicted and where they are located for reference       Expected Outcomes Short Term: Able to state/look up THRR;Short Term: Able to use daily as guideline for intensity in rehab;Long Term: Able to use THRR to govern intensity when exercising independently       Able to check pulse independently Yes       Intervention Provide education and demonstration on how to check pulse in carotid and radial arteries.;Review the importance of being able to check your own pulse for safety during independent exercise       Expected Outcomes Short Term: Able to explain why pulse checking is important during independent  exercise;Long Term: Able to check pulse independently and accurately       Understanding of Exercise Prescription Yes       Intervention Provide education, explanation, and written materials on patient's individual exercise prescription       Expected Outcomes Short Term: Able to explain program exercise prescription;Long Term: Able to explain home exercise prescription to exercise independently              Exercise Goals Re-Evaluation :  Exercise Goals Re-Evaluation    Row Name 07/22/19 0803 07/29/19 0722 08/14/19 0710 08/30/19 0721       Exercise Goal Re-Evaluation   Exercise Goals Review Increase Physical Activity;Able to understand and use rate of perceived exertion (RPE) scale Increase Physical Activity;Able to understand and use rate of perceived exertion (RPE) scale;Increase Strength and Stamina;Able to check pulse independently;Knowledge and understanding of Target Heart Rate Range (THRR);Understanding of Exercise Prescription Increase Physical Activity;Able to understand and use rate of perceived exertion (RPE) scale;Increase Strength and Stamina;Able to check pulse independently;Knowledge and understanding of Target Heart Rate Range (THRR);Understanding of Exercise Prescription Increase Physical Activity;Able to understand and use rate of perceived exertion (RPE) scale;Increase Strength and Stamina;Able to check pulse independently;Knowledge and understanding of Target Heart Rate Range (THRR);Understanding of Exercise Prescription    Comments Patient able to understand and use RPE scale appropriately. Reviewed home exercise guidelines with patient including endpoints, temperature precautions, target heart rate and rate of perceived exertion. Pt is walking 15 minutes, 2 days/week as his mode of home exercise. Discussed increasing exercise duration either to 15 minutes, twice/day or increase to 30 minutes once/day, and patient is amenable to this. Patient has a smart watch to monitor his  heart rate. Pt voices understanding of instructions given. Patient is doign well with his exercise at cardiac rehab and at home. Patient is walking 30 minutes every evening with his wife without issues. Patient continues to make excellent progress, achieving 7.0 METs with exercise. Patient is walking 55-60 minutes 5 days/week. Patient has hand weights at home but is not yet using them for his resistance training. Patient has a smart watch to check his pulse. Encouraged patient to hydrate with exercise.    Expected Outcomes Increase workloads as tolerated to help improve cardiorespiratory fitness. Patient will increase walking duration at home from 15 minutes 2 days/week to 30 minutes 2 days/week in addition to exercise at cardiac rehab. Patient will continue current exercise at home and increase workloads at cardiac rehab as tolerated. Patient will continue daily exercise routine to maintain health and fitness gains.           Discharge Exercise Prescription (Final Exercise Prescription Changes):  Exercise Prescription Changes - 08/26/19 0702      Response to Exercise   Blood  Pressure (Admit) 92/64    Blood Pressure (Exercise) 110/64    Blood Pressure (Exit) 100/70    Heart Rate (Admit) 60 bpm    Heart Rate (Exercise) 134 bpm    Heart Rate (Exit) 66 bpm    Rating of Perceived Exertion (Exercise) 13    Symptoms none    Duration Continue with 30 min of aerobic exercise without signs/symptoms of physical distress.    Intensity THRR unchanged      Progression   Progression Continue to progress workloads to maintain intensity without signs/symptoms of physical distress.    Average METs 6.6      Resistance Training   Training Prescription Yes    Weight 6lbs    Reps 10-15    Time 10 Minutes      Interval Training   Interval Training No      Bike   Level 4    Minutes 15    METs 8.6      Recumbant Bike   Level --    Minutes --    METs --      NuStep   Level 5    SPM 85     Minutes 15    METs 4.6      Home Exercise Plan   Plans to continue exercise at Home (comment)   Walking   Frequency Add 2 additional days to program exercise sessions.    Initial Home Exercises Provided 07/29/19           Nutrition:  Target Goals: Understanding of nutrition guidelines, daily intake of sodium <1573m, cholesterol <2063m calories 30% from fat and 7% or less from saturated fats, daily to have 5 or more servings of fruits and vegetables.  Biometrics:  Pre Biometrics - 07/18/19 1011      Pre Biometrics   Height _0  (1.702 m)    Weight 78.9 kg    Waist Circumference 38.25 inches    Hip Circumference 39.25 inches    Waist to Hip Ratio 0.97 %    BMI (Calculated) 27.24    Triceps Skinfold 11 mm    % Body Fat 24.9 %    Grip Strength 52 kg    Flexibility 14 in    Single Leg Stand 120 seconds            Nutrition Therapy Plan and Nutrition Goals:  Nutrition Therapy & Goals - 07/26/19 1026      Nutrition Therapy   Diet Heart Healthy      Personal Nutrition Goals   Nutrition Goal Pt to build a healthy plate including vegetables, fruits, whole grains, and low-fat dairy products in a heart healthy meal plan.    Personal Goal #2 Pt to eat a variety of non-starchy vegetables and fruits      Intervention Plan   Intervention Nutrition handout(s) given to patient.;Prescribe, educate and counsel regarding individualized specific dietary modifications aiming towards targeted core components such as weight, hypertension, lipid management, diabetes, heart failure and other comorbidities.    Expected Outcomes Short Term Goal: A plan has been developed with personal nutrition goals set during dietitian appointment.           Nutrition Assessments:  Nutrition Assessments - 07/26/19 1026      MEDFICTS Scores   Pre Score 33           Nutrition Goals Re-Evaluation:  Nutrition Goals Re-Evaluation    Row Name 07/26/19 1026 08/08/19 0739 08/27/19 0731  Goals   Current Weight 173 lb (78.5 kg) 173 lb 11.6 oz (78.8 kg) 172 lb 6.4 oz (78.2 kg)     Nutrition Goal -- Pt to build a healthy plate including vegetables, fruits, whole grains, and low-fat dairy products in a heart healthy meal plan. Pt to build a healthy plate including vegetables, fruits, whole grains, and low-fat dairy products in a heart healthy meal plan.     Comment -- -- Pt continuing to maintain a heart healthy diet but struggles to get adequate fruits and veggies due to being picky eater. Plan to incorporate smoothies to increase fruit and veggie intake.       Personal Goal #2 Re-Evaluation   Personal Goal #2 -- Pt to eat a variety of non-starchy vegetables and fruits Pt to eat a variety of non-starchy vegetables and fruits            Nutrition Goals Re-Evaluation:  Nutrition Goals Re-Evaluation    [place] Name 07/26/19 1026 08/08/19 0739 08/27/19 0731         Goals   Current Weight 173 lb (78.5 kg) 173 lb 11.6 oz (78.8 kg) 172 lb 6.4 oz (78.2 kg)     Nutrition Goal -- Pt to build a healthy plate including vegetables, fruits, whole grains, and low-fat dairy products in a heart healthy meal plan. Pt to build a healthy plate including vegetables, fruits, whole grains, and low-fat dairy products in a heart healthy meal plan.     Comment -- -- Pt continuing to maintain a heart healthy diet but struggles to get adequate fruits and veggies due to being picky eater. Plan to incorporate smoothies to increase fruit and veggie intake.       Personal Goal #2 Re-Evaluation   Personal Goal #2 -- Pt to eat a variety of non-starchy vegetables and fruits Pt to eat a variety of non-starchy vegetables and fruits            Nutrition Goals Discharge (Final Nutrition Goals Re-Evaluation):  Nutrition Goals Re-Evaluation - 08/27/19 0731      Goals   Current Weight 172 lb 6.4 oz (78.2 kg)    Nutrition Goal Pt to build a healthy plate including vegetables, fruits, whole grains, and low-fat  dairy products in a heart healthy meal plan.    Comment Pt continuing to maintain a heart healthy diet but struggles to get adequate fruits and veggies due to being picky eater. Plan to incorporate smoothies to increase fruit and veggie intake.      Personal Goal #2 Re-Evaluation   Personal Goal #2 Pt to eat a variety of non-starchy vegetables and fruits           Psychosocial: Target Goals: Acknowledge presence or absence of significant depression and/or stress, maximize coping skills, provide positive support system. Participant is able to verbalize types and ability to use techniques and skills needed for reducing stress and depression.  Initial Review & Psychosocial Screening:  Initial Psych Review & Screening - 07/18/19 1258      Initial Review   Current issues with None Identified      Family Dynamics   Good Support System? Yes   [name] has his wife and children for support     Barriers   Psychosocial barriers to participate in program There are no identifiable barriers or psychosocial needs.      Screening Interventions   Interventions Encouraged to exercise           Quality of Life Scores:  Quality of Life - 07/18/19 1001      Quality of Life   Select Quality of Life      Quality of Life Scores   Health/Function Pre 31.4 %    Socioeconomic Pre 19.14 %    Psych/Spiritual Pre 19.86 %    Family Pre 24.9 %    GLOBAL Pre 25.54 %          Scores of 19 and below usually indicate a poorer quality of life in these areas.  A difference of  2-3 points is a clinically meaningful difference.  A difference of 2-3 points in the total score of the Quality of Life Index has been associated with significant improvement in overall quality of life, self-image, physical symptoms, and general health in studies assessing change in quality of life.  PHQ-9: Recent Review Flowsheet Data    Depression screen [facility] 2/9 07/18/2019   Decreased Interest 0   Down, Depressed, Hopeless 0    PHQ - 2 Score 0     Interpretation of Total Score  Total Score Depression Severity:  1-4 = Minimal depression, 5-9 = Mild depression, 10-14 = Moderate depression, 15-19 = Moderately severe depression, 20-27 = Severe depression   Psychosocial Evaluation and Intervention:  Psychosocial Evaluation - 07/22/19 1038      Psychosocial Evaluation & Interventions   Interventions Encouraged to exercise with the program and follow exercise prescription    Comments Mr. Resurreccion continues to have a positive outlook and attitude. He denies any psychosocial barriers to participation in cardiac rehab or self health management. He enjoys cars and walking trails as a stress reducer and hobbie.    Expected Outcomes Patient will continue to have a positive outlook and attitude. He will utilize his support system as needed.    Continue Psychosocial Services  No Follow up required           Psychosocial Re-Evaluation:  Psychosocial Re-Evaluation    [place] Name 08/06/19 1229 09/02/19 0957           Psychosocial Re-Evaluation   Current issues with -- None Identified      Comments Mr. Fleming continues to have a positive outlook and attitude. He denies any psychosocial barriers to participation in cardiac rehab or self health management. He enjoys cars and walking trails as a stress reducer and hobbie. Mr. Cueva continues to have a positive outlook and attitude. He denies any psychosocial barriers to participation in cardiac rehab or self health management. He enjoys cars and walking trails as a stress reducer and hobbie.      Expected Outcomes Patient will continue to have a positive outlook and attitude. He will utilize his support system as needed. --      Interventions Encouraged to attend Cardiac Rehabilitation for the exercise Encouraged to attend Cardiac Rehabilitation for the exercise      Continue Psychosocial Services  No Follow up required No Follow up required             Psychosocial Discharge  (Final Psychosocial Re-Evaluation):  Psychosocial Re-Evaluation - 09/02/19 0957      Psychosocial Re-Evaluation   Current issues with None Identified    Comments Mr. Pracht continues to have a positive outlook and attitude. He denies any psychosocial barriers to participation in cardiac rehab or self health management. He enjoys cars and walking trails as a stress reducer and hobbie.    Interventions Encouraged to attend Cardiac Rehabilitation for the exercise    Continue Psychosocial Services  No Follow up required           Vocational Rehabilitation: Provide vocational rehab assistance to qualifying candidates.   Vocational Rehab Evaluation & Intervention:  Vocational Rehab - 07/18/19 1300      Initial Vocational Rehab Evaluation & Intervention   Assessment shows need for Vocational Rehabilitation No   Mr Mester works full time and does not need vocational rehab at this time          Education: Education Goals: Education classes will be provided on a weekly basis, covering required topics. Participant will state understanding/return demonstration of topics presented.  Learning Barriers/Preferences:  Learning Barriers/Preferences - 07/18/19 1135      Learning Barriers/Preferences   Learning Barriers None    Learning Preferences Audio;Verbal Instruction           Education Topics: Count Your Pulse:  -Group instruction provided by verbal instruction, demonstration, patient participation and written materials to support subject.  Instructors address importance of being able to find your pulse and how to count your pulse when at home without a heart monitor.  Patients get hands on experience counting their pulse with staff help and individually.   Heart Attack, Angina, and Risk Factor Modification:  -Group instruction provided by verbal instruction, video, and written materials to support subject.  Instructors address signs and symptoms of angina and heart attacks.    Also  discuss risk factors for heart disease and how to make changes to improve heart health risk factors.   Functional Fitness:  -Group instruction provided by verbal instruction, demonstration, patient participation, and written materials to support subject.  Instructors address safety measures for doing things around the house.  Discuss how to get up and down off the floor, how to pick things up properly, how to safely get out of a chair without assistance, and balance training.   Meditation and Mindfulness:  -Group instruction provided by verbal instruction, patient participation, and written materials to support subject.  Instructor addresses importance of mindfulness and meditation practice to help reduce stress and improve awareness.  Instructor also leads participants through a meditation exercise.    Stretching for Flexibility and Mobility:  -Group instruction provided by verbal instruction, patient participation, and written materials to support subject.  Instructors lead participants through series of stretches that are designed to increase flexibility thus improving mobility.  These stretches are additional exercise for major muscle groups that are typically performed during regular warm up and cool down.   Hands Only CPR:  -Group verbal, video, and participation provides a basic overview of AHA guidelines for community CPR. Role-play of emergencies allow participants the opportunity to practice calling for help and chest compression technique with discussion of AED use.   Hypertension: -Group verbal and written instruction that provides a basic overview of hypertension including the most recent diagnostic guidelines, risk factor reduction with self-care instructions and medication management.    Nutrition I class: Heart Healthy Eating:  -Group instruction provided by PowerPoint slides, verbal discussion, and written materials to support subject matter. The instructor gives an  explanation and review of the Therapeutic Lifestyle Changes diet recommendations, which includes a discussion on lipid goals, dietary fat, sodium, fiber, plant stanol/sterol esters, sugar, and the components of a well-balanced, healthy diet.   Nutrition II class: Lifestyle Skills:  -Group instruction provided by PowerPoint slides, verbal discussion, and written materials to support subject matter. The instructor gives an explanation and review of label reading, grocery shopping for heart health, heart healthy recipe  modifications, and ways to make healthier choices when eating out.   Diabetes Question & Answer:  -Group instruction provided by PowerPoint slides, verbal discussion, and written materials to support subject matter. The instructor gives an explanation and review of diabetes co-morbidities, pre- and post-prandial blood glucose goals, pre-exercise blood glucose goals, signs, symptoms, and treatment of hypoglycemia and hyperglycemia, and foot care basics.   Diabetes Blitz:  -Group instruction provided by PowerPoint slides, verbal discussion, and written materials to support subject matter. The instructor gives an explanation and review of the physiology behind type 1 and type 2 diabetes, diabetes medications and rational behind using different medications, pre- and post-prandial blood glucose recommendations and Hemoglobin A1c goals, diabetes diet, and exercise including blood glucose guidelines for exercising safely.    Portion Distortion:  -Group instruction provided by PowerPoint slides, verbal discussion, written materials, and food models to support subject matter. The instructor gives an explanation of serving size versus portion size, changes in portions sizes over the last 20 years, and what consists of a serving from each food group.   Stress Management:  -Group instruction provided by verbal instruction, video, and written materials to support subject matter.  Instructors  review role of stress in heart disease and how to cope with stress positively.     Exercising on Your Own:  -Group instruction provided by verbal instruction, power point, and written materials to support subject.  Instructors discuss benefits of exercise, components of exercise, frequency and intensity of exercise, and end points for exercise.  Also discuss use of nitroglycerin and activating EMS.  Review options of places to exercise outside of rehab.  Review guidelines for sex with heart disease.   Cardiac Drugs I:  -Group instruction provided by verbal instruction and written materials to support subject.  Instructor reviews cardiac drug classes: antiplatelets, anticoagulants, beta blockers, and statins.  Instructor discusses reasons, side effects, and lifestyle considerations for each drug class.   Cardiac Drugs II:  -Group instruction provided by verbal instruction and written materials to support subject.  Instructor reviews cardiac drug classes: angiotensin converting enzyme inhibitors (ACE-I), angiotensin II receptor blockers (ARBs), nitrates, and calcium channel blockers.  Instructor discusses reasons, side effects, and lifestyle considerations for each drug class.   Anatomy and Physiology of the Circulatory System:  Group verbal and written instruction and models provide basic cardiac anatomy and physiology, with the coronary electrical and arterial systems. Review of: AMI, Angina, Valve disease, Heart Failure, Peripheral Artery Disease, Cardiac Arrhythmia, Pacemakers, and the ICD.   Other Education:  -Group or individual verbal, written, or video instructions that support the educational goals of the cardiac rehab program.   Holiday Eating Survival Tips:  -Group instruction provided by PowerPoint slides, verbal discussion, and written materials to support subject matter. The instructor gives patients tips, tricks, and techniques to help them not only survive but enjoy the holidays  despite the onslaught of food that accompanies the holidays.   Knowledge Questionnaire Score:  Knowledge Questionnaire Score - 07/18/19 1001      Knowledge Questionnaire Score   Pre Score 23/24           Core Components/Risk Factors/Patient Goals at Admission:  Personal Goals and Risk Factors at Admission - 07/18/19 1131      Core Components/Risk Factors/Patient Goals on Admission    Weight Management Weight Loss;Yes    Intervention Weight Management: Provide education and appropriate resources to help participant work on and attain dietary goals.;Weight Management: Develop a combined nutrition and exercise  program designed to reach desired caloric intake, while maintaining appropriate intake of nutrient and fiber, sodium and fats, and appropriate energy expenditure required for the weight goal.    Admit Weight 173 lb 15.1 oz (78.9 kg)    Goal Weight: Short Term 5 lb (2.268 kg)    Expected Outcomes Short Term: Continue to assess and modify interventions until short term weight is achieved;Long Term: Adherence to nutrition and physical activity/exercise program aimed toward attainment of established weight goal;Weight Loss: Understanding of general recommendations for a balanced deficit meal plan, which promotes 1-2 lb weight loss per week and includes a negative energy balance of [phone] kcal/d;Understanding recommendations for meals to include 15-35% energy as protein, 25-35% energy from fat, 35-60% energy from carbohydrates, less than 236m of dietary cholesterol, 20-35 gm of total fiber daily;Understanding of distribution of calorie intake throughout the day with the consumption of 4-5 meals/snacks    Lipids Yes    Intervention Provide education and support for participant on nutrition & aerobic/resistive exercise along with prescribed medications to achieve LDL <711m HDL >4044m   Expected Outcomes Short Term: Participant states understanding of desired cholesterol values and is  compliant with medications prescribed. Participant is following exercise prescription and nutrition guidelines.;Long Term: Cholesterol controlled with medications as prescribed, with individualized exercise RX and with personalized nutrition plan. Value goals: LDL < 63m46mDL > 40 mg.           Core Components/Risk Factors/Patient Goals Review:   Goals and Risk Factor Review    Row Name 07/23/19 1048 08/06/19 1229 09/02/19 0957         Core Components/Risk Factors/Patient Goals Review   Personal Goals Review Weight Management/Obesity;Lipids Weight Management/Obesity;Lipids Weight Management/Obesity;Lipids     Review Mr. MillPitner several CAD risk factors. His goals are to participate in CR exercise to make sure he is able to safely exercise at home. He hopes to walk trails again and to get past pre-heart attack activities. Mr. MillRudnick several CAD risk factors. His goals are to participate in CR exercise to make sure he is able to safely exercise at home. He hopes to walk trails again and to get past pre-heart attack activities. Mr. MillBendorf several CAD risk factors. His goals are to participate in CR exercise to make sure he is able to safely exercise at home. He hopes to walk trails again and to get past pre-heart attack activities. He is on target to meet his personal goals at graduation. 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 MillBrachl continue to participate in CR Mr. MillFranekl continue to participate in CR for risk factor modification and education including endpoints to exercise Mr. MillLuptonl continue to participate in CR for risk factor modification and education including endpoints to exercise            Core Components/Risk Factors/Patient Goals at Discharge (Final Review):   Goals and Risk Factor Review - 09/02/19 0957      Core Components/Risk Factors/Patient Goals Review    Personal Goals Review Weight Management/Obesity;Lipids    Review Mr. MillFrasier several CAD risk factors. His goals are to participate in CR exercise to make sure he is able to safely exercise at home. He hopes to walk trails again and to get past pre-heart attack activities. He is on target to meet his personal goals at graduation. 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 Lundberg will continue to participate in CR for risk factor modification and education including endpoints to exercise           ITP Comments:  ITP Comments    Row Name 07/18/19 0834 07/22/19 1037 08/06/19 1221 09/02/19 0950     ITP Comments Dr. Fransico Him MD, Medical Director Mr. Knauer completed his first cardiac rehab exercise session today and tolerated very well. Denied complaints VSS. Worked to an RPE of 11-14 with mets 4.7-7.0. 30 day ITP review: Mr Rivet has completed 7 exercise sessions since admission. At his last exercise session he c/o dizziness, diaphoresis, and shortness of breath. Rapid responce was called for symptomatic hypotension. He was treated with rehydrations and discharged in stable condition. RN/EP reinforced endpoints to exercise as patient states he was symptomatic but continues to exercise. He is strongly encouraged and educated on the importance of not working beyond an RPE of 13. He pushes himself to mets of >7.0. Will continue to monitor progression towards goals of learning limits to exercise and how to exercise safely.  30 day ITP review: Mr Steinert has completed 16  exercise sessions since admission. He has not had another hypotensive event. He continues to need reminding to only work to an RPE of 11-13. He appears to be working extremely hard as his met levels are 4.5 on the Nu[employer] and 8.5 on bike. He perfusely perspires and has to be reminded to replenish fluid loss by drinking water. He is scheduled to graduate  from the [facility] program on 09/13/19. He understands limits of exercise however unsure if he will follow post graduation as his desire to return to pre-heart attack health may be greater than his desire for safe exercise.           Comments: see ITP comments

## 2019-09-04 ENCOUNTER — Other Ambulatory Visit: Payer: Self-pay

## 2019-09-04 ENCOUNTER — Encounter (HOSPITAL_COMMUNITY)
Admission: RE | Admit: 2019-09-04 | Discharge: 2019-09-04 | Disposition: A | Discharge status: Home / Self Care | Payer: BC Managed Care – PPO | Source: Ambulatory Visit | Attending: Cardiology | Admitting: Cardiology

## 2019-09-04 DIAGNOSIS — I2102 ST elevation (STEMI) myocardial infarction involving left anterior descending coronary artery: Secondary | ICD-10-CM | POA: Diagnosis not present

## 2019-09-04 DIAGNOSIS — Z955 Presence of coronary angioplasty implant and graft: Secondary | ICD-10-CM

## 2019-09-06 ENCOUNTER — Other Ambulatory Visit: Payer: Self-pay

## 2019-09-06 ENCOUNTER — Encounter (HOSPITAL_COMMUNITY)
Admission: RE | Admit: 2019-09-06 | Discharge: 2019-09-06 | Disposition: A | Discharge status: Home / Self Care | Payer: BC Managed Care – PPO | Source: Ambulatory Visit | Attending: Cardiology | Admitting: Cardiology

## 2019-09-06 DIAGNOSIS — Z955 Presence of coronary angioplasty implant and graft: Secondary | ICD-10-CM

## 2019-09-06 DIAGNOSIS — I2102 ST elevation (STEMI) myocardial infarction involving left anterior descending coronary artery: Secondary | ICD-10-CM

## 2019-09-09 ENCOUNTER — Encounter (HOSPITAL_COMMUNITY)
Admission: RE | Admit: 2019-09-09 | Discharge: 2019-09-09 | Disposition: A | Discharge status: Home / Self Care | Payer: BC Managed Care – PPO | Source: Ambulatory Visit | Attending: Cardiology | Admitting: Cardiology

## 2019-09-09 ENCOUNTER — Other Ambulatory Visit: Payer: Self-pay

## 2019-09-09 DIAGNOSIS — I2102 ST elevation (STEMI) myocardial infarction involving left anterior descending coronary artery: Secondary | ICD-10-CM

## 2019-09-09 DIAGNOSIS — Z955 Presence of coronary angioplasty implant and graft: Secondary | ICD-10-CM

## 2019-09-09 NOTE — Addendum Note (Signed)
Encounter addended by: Artist Pais on: 09/09/2019 8:50 AM  Actions taken: Flowsheet accepted

## 2019-09-11 ENCOUNTER — Other Ambulatory Visit: Payer: Self-pay

## 2019-09-11 ENCOUNTER — Encounter (HOSPITAL_COMMUNITY)
Admission: RE | Admit: 2019-09-11 | Discharge: 2019-09-11 | Disposition: A | Discharge status: Home / Self Care | Payer: BC Managed Care – PPO | Source: Ambulatory Visit | Attending: Cardiology | Admitting: Cardiology

## 2019-09-11 DIAGNOSIS — I2102 ST elevation (STEMI) myocardial infarction involving left anterior descending coronary artery: Secondary | ICD-10-CM

## 2019-09-11 DIAGNOSIS — Z955 Presence of coronary angioplasty implant and graft: Secondary | ICD-10-CM

## 2019-09-13 ENCOUNTER — Encounter (HOSPITAL_COMMUNITY)
Admission: RE | Admit: 2019-09-13 | Discharge: 2019-09-13 | Disposition: A | Discharge status: Home / Self Care | Payer: BC Managed Care – PPO | Source: Ambulatory Visit | Attending: Cardiology | Admitting: Cardiology

## 2019-09-13 ENCOUNTER — Other Ambulatory Visit: Payer: Self-pay

## 2019-09-13 VITALS — BP 108/70 | HR 71 | Ht 67.0 in | Wt 174.2 lb

## 2019-09-13 DIAGNOSIS — Z955 Presence of coronary angioplasty implant and graft: Secondary | ICD-10-CM

## 2019-09-13 DIAGNOSIS — I2102 ST elevation (STEMI) myocardial infarction involving left anterior descending coronary artery: Secondary | ICD-10-CM

## 2019-09-15 HISTORY — PX: TRANSTHORACIC ECHOCARDIOGRAM: SHX275

## 2019-09-20 NOTE — Progress Notes (Signed)
Discharge Progress Report  Patient Details  Name: [name] [name] MRN: [id number] Date of Birth: 06-17-1965 Referring Provider:     [facility] from 07/18/2019 in [place]  Referring Provider Leonie Man.       Number of Visits: 21 of 24 sessions  Reason for Discharge:  Patient reached a stable level of exercise. Patient independent in their exercise. Patient has met program and personal goals.  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    Expected Discharge Date 09/13/19      Bike   Level 2    Watts 35    Minutes 15    METs 2.4      NuStep   Level 3    SPM 60    Minutes 15    METs 2.5      Prescription Details   Frequency (times per week) 3    Duration Progress to 30 minutes of continuous aerobic without signs/symptoms of physical distress      Intensity   THRR 40-80% of Max Heartrate 67 -134    Ratings of Perceived Exertion 11-13    Perceived Dyspnea 0-4      Resistance Training   Training Prescription Yes    Weight 4    Reps 10-15           Discharge Exercise Prescription (Final Exercise Prescription Changes):  Exercise Prescription Changes - 09/13/19 0701      Response to Exercise   Blood Pressure (Admit) 108/70    Blood Pressure (Exercise) 120/76    Blood Pressure (Exit) 102/62    Heart Rate (Admit) 71 bpm    Heart Rate (Exercise) 131 bpm    Heart Rate (Exit) 75 bpm    Rating of Perceived Exertion (Exercise) 13    Symptoms none    Duration Continue with 30 min of  aerobic exercise without signs/symptoms of physical distress.    Intensity THRR unchanged      Progression   Progression Continue to progress workloads to maintain intensity without signs/symptoms of physical distress.    Average METs 7.1      Resistance Training   Training Prescription Yes    Weight 6lbs    Reps 10-15    Time 10 Minutes      Interval Training   Interval Training No      Bike   Level 5    Minutes 15    METs 9      NuStep   Level 5    SPM 85    Minutes 15    METs 5.1      Home Exercise Plan   Plans to continue exercise at Home (comment)   Walking   Frequency Add 2 additional days to program exercise sessions.    Initial Home Exercises Provided 07/29/19           Functional Capacity:  6 Minute Walk    Row Name 07/18/19 0857 09/06/19 0714       6 Minute Walk  Phase -- Discharge    Distance 2000 feet 2083 feet    Distance % Change -- 4.15 %    Distance Feet Change -- 83 ft    Walk Time 6 minutes 6 minutes    # of Rest Breaks 0 0    MPH 3.78 3.95    METS 5.05 5.14    RPE 10 11    Perceived Dyspnea  0 0    VO2 Peak 17.7 17.98    Symptoms No No    Resting HR 79 bpm 52 bpm    Resting BP 98/60 96/62    Resting Oxygen Saturation  97 % --    Exercise Oxygen Saturation  during 6 min walk 96 % --    Max Ex. HR 107 bpm 116 bpm    Max Ex. BP 122/68 102/64    2 Minute Post BP 108/62 96/60           Psychological, QOL, Others - Outcomes: PHQ 2/9: Depression screen PHQ 2/9 07/18/2019  Decreased Interest 0  Down, Depressed, Hopeless 0  PHQ - 2 Score 0    Quality of Life:  Quality of Life - 09/09/19 0900      Quality of Life   Select Quality of Life      Quality of Life Scores   Health/Function Pre 19.4 %    Health/Function Post 19.33 %    Health/Function % Change -0.36 %    Socioeconomic Pre 19.14 %    Socioeconomic Post 19.36 %    Socioeconomic % Change  1.15 %    Psych/Spiritual Pre 19.86 %    Psych/Spiritual Post 21.07 %     Psych/Spiritual % Change 6.09 %    Family Pre 24.9 %    Family Post 22.5 %    Family % Change -9.64 %    GLOBAL Pre 20.25 %    GLOBAL Post 20.16 %    GLOBAL % Change -0.44 %           Personal Goals: Goals established at orientation with interventions provided to work toward goal.  Personal Goals and Risk Factors at Admission - 07/18/19 1131      Core Components/Risk Factors/Patient Goals on Admission    Weight Management Weight Loss;Yes    Intervention Weight Management: Provide education and appropriate resources to help participant work on and attain dietary goals.;Weight Management: Develop a combined nutrition and exercise program designed to reach desired caloric intake, while maintaining appropriate intake of nutrient and fiber, sodium and fats, and appropriate energy expenditure required for the weight goal.    Admit Weight 173 lb 15.1 oz (78.9 kg)    Goal Weight: Short Term 5 lb (2.268 kg)    Expected Outcomes Short Term: Continue to assess and modify interventions until short term weight is achieved;Long Term: Adherence to nutrition and physical activity/exercise program aimed toward attainment of established weight goal;Weight Loss: Understanding of general recommendations for a balanced deficit meal plan, which promotes 1-2 lb weight loss per week and includes a negative energy balance of [phone] kcal/d;Understanding recommendations for meals to include 15-35% energy as protein, 25-35% energy from fat, 35-60% energy from carbohydrates, less than 235m of dietary cholesterol, 20-35 gm of total fiber daily;Understanding of distribution of calorie intake throughout the day with the consumption of 4-5 meals/snacks    Lipids Yes    Intervention Provide education and support for participant on nutrition & aerobic/resistive exercise along with prescribed medications to achieve LDL <780m HDL >4027m  Expected Outcomes Short Term: Participant states understanding of desired cholesterol  values and is compliant with medications prescribed. Participant is following exercise prescription and nutrition guidelines.;Long Term: Cholesterol controlled with medications as prescribed, with individualized exercise RX and with personalized nutrition plan. Value goals: LDL < 68m, HDL > 40 mg.            Personal Goals Discharge:  Goals and Risk Factor Review    Row Name 07/23/19 1048 08/06/19 1229 09/02/19 0957 09/20/19 0923       Core Components/Risk Factors/Patient Goals Review   Personal Goals Review Weight Management/Obesity;Lipids Weight Management/Obesity;Lipids Weight Management/Obesity;Lipids Weight Management/Obesity;Lipids    Review Mr. MBarthas several CAD risk factors. His goals are to participate in CR exercise to make sure he is able to safely exercise at home. He hopes to walk trails again and to get past pre-heart attack activities. Mr. MReaserhas several CAD risk factors. His goals are to participate in CR exercise to make sure he is able to safely exercise at home. He hopes to walk trails again and to get past pre-heart attack activities. Mr. MTozzihas several CAD risk factors. His goals are to participate in CR exercise to make sure he is able to safely exercise at home. He hopes to walk trails again and to get past pre-heart attack activities. He is on target to meet his personal goals at graduation. 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 MRantahas several CAD risk factors. His goals are to participate in CR exercise to make sure he is able to safely exercise at home. He hopes to walk trails again and to get past pre-heart attack activities. He has met his personal goals at discharge. 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 MCammackwill continue to participate in CR Mr. MFronterawill continue to participate in CR for risk  factor modification and education including endpoints to exercise Mr. MBossardwill continue to participate in CR for risk factor modification and education including endpoints to exercise Mr. MPoormanwill continue CAD riskfactor modifications post discharge including daily exercise, medication, diet, and medical appointment adherence           Exercise Goals and Review:  Exercise Goals    Row Name 07/18/19 1010             Exercise Goals   Increase Physical Activity Yes       Intervention Provide advice, education, support and counseling about physical activity/exercise needs.;Develop an individualized exercise prescription for aerobic and resistive training based on initial evaluation findings, risk stratification, comorbidities and participant's personal goals.       Expected Outcomes Short Term: Attend rehab on a regular basis to increase amount of physical activity.;Long Term: Add in home exercise to make exercise part of routine and to increase amount of physical activity.;Long Term: Exercising regularly at least 3-5 days a week.       Increase Strength and Stamina Yes       Intervention Provide advice, education, support and counseling about physical activity/exercise needs.;Develop an individualized exercise prescription for aerobic and resistive training based on initial evaluation findings, risk stratification, comorbidities and participant's personal goals.       Expected Outcomes Short Term: Increase workloads from initial exercise prescription for resistance, speed, and METs.;Short Term: Perform resistance training exercises routinely during rehab and add in resistance  training at home;Long Term: Improve cardiorespiratory fitness, muscular endurance and strength as measured by increased METs and functional capacity (6MWT)       Able to understand and use rate of perceived exertion (RPE) scale Yes       Intervention Provide education and explanation on how to use RPE scale        Expected Outcomes Short Term: Able to use RPE daily in rehab to express subjective intensity level;Long Term:  Able to use RPE to guide intensity level when exercising independently       Knowledge and understanding of Target Heart Rate Range (THRR) Yes       Intervention Provide education and explanation of THRR including how the numbers were predicted and where they are located for reference       Expected Outcomes Short Term: Able to state/look up THRR;Short Term: Able to use daily as guideline for intensity in rehab;Long Term: Able to use THRR to govern intensity when exercising independently       Able to check pulse independently Yes       Intervention Provide education and demonstration on how to check pulse in carotid and radial arteries.;Review the importance of being able to check your own pulse for safety during independent exercise       Expected Outcomes Short Term: Able to explain why pulse checking is important during independent exercise;Long Term: Able to check pulse independently and accurately       Understanding of Exercise Prescription Yes       Intervention Provide education, explanation, and written materials on patient's individual exercise prescription       Expected Outcomes Short Term: Able to explain program exercise prescription;Long Term: Able to explain home exercise prescription to exercise independently              Exercise Goals Re-Evaluation:  Exercise Goals Re-Evaluation    Row Name 07/22/19 0803 07/29/19 0722 08/14/19 0710 08/30/19 0721 09/13/19 0701     Exercise Goal Re-Evaluation   Exercise Goals Review Increase Physical Activity;Able to understand and use rate of perceived exertion (RPE) scale Increase Physical Activity;Able to understand and use rate of perceived exertion (RPE) scale;Increase Strength and Stamina;Able to check pulse independently;Knowledge and understanding of Target Heart Rate Range (THRR);Understanding of Exercise Prescription Increase  Physical Activity;Able to understand and use rate of perceived exertion (RPE) scale;Increase Strength and Stamina;Able to check pulse independently;Knowledge and understanding of Target Heart Rate Range (THRR);Understanding of Exercise Prescription Increase Physical Activity;Able to understand and use rate of perceived exertion (RPE) scale;Increase Strength and Stamina;Able to check pulse independently;Knowledge and understanding of Target Heart Rate Range (THRR);Understanding of Exercise Prescription Increase Physical Activity;Able to understand and use rate of perceived exertion (RPE) scale;Increase Strength and Stamina;Able to check pulse independently;Knowledge and understanding of Target Heart Rate Range (THRR);Understanding of Exercise Prescription   Comments Patient able to understand and use RPE scale appropriately. Reviewed home exercise guidelines with patient including endpoints, temperature precautions, target heart rate and rate of perceived exertion. Pt is walking 15 minutes, 2 days/week as his mode of home exercise. Discussed increasing exercise duration either to 15 minutes, twice/day or increase to 30 minutes once/day, and patient is amenable to this. Patient has a smart watch to monitor his heart rate. Pt voices understanding of instructions given. Patient is doign well with his exercise at cardiac rehab and at home. Patient is walking 30 minutes every evening with his wife without issues. Patient continues to make excellent progress, achieving 7.0  METs with exercise. Patient is walking 55-60 minutes 5 days/week. Patient has hand weights at home but is not yet using them for his resistance training. Patient has a smart watch to check his pulse. Encouraged patient to hydrate with exercise. Patient completed the cardiac rehab program and progressed well achieving 7.3 METs with exercise. Patient feels that he has benefited from participation in the program. Patient feels he has better endurance and  is less tired with exercise.   Expected Outcomes Increase workloads as tolerated to help improve cardiorespiratory fitness. Patient will increase walking duration at home from 15 minutes 2 days/week to 30 minutes 2 days/week in addition to exercise at cardiac rehab. Patient will continue current exercise at home and increase workloads at cardiac rehab as tolerated. Patient will continue daily exercise routine to maintain health and fitness gains. Patient will continue walking 55-60 minutes daily to maintain health and fitness gains.          Nutrition & Weight - Outcomes:  Pre Biometrics - 07/18/19 1011      Pre Biometrics   Height _0  (1.702 m)    Weight 78.9 kg    Waist Circumference 38.25 inches    Hip Circumference 39.25 inches    Waist to Hip Ratio 0.97 %    BMI (Calculated) 27.24    Triceps Skinfold 11 mm    % Body Fat 24.9 %    Grip Strength 52 kg    Flexibility 14 in    Single Leg Stand 120 seconds           Post Biometrics - 09/13/19 0701       Post  Biometrics   Height _1  (1.702 m)    Weight 79 kg    Waist Circumference 36.25 inches    Hip Circumference 38 inches    Waist to Hip Ratio 0.95 %    BMI (Calculated) 27.27    Triceps Skinfold 10 mm    % Body Fat 23.6 %    Grip Strength 53.5 kg    Flexibility 13.75 in    Single Leg Stand 30 seconds           Nutrition:  Nutrition Therapy & Goals - 07/26/19 1026      Nutrition Therapy   Diet Heart Healthy      Personal Nutrition Goals   Nutrition Goal Pt to build a healthy plate including vegetables, fruits, whole grains, and low-fat dairy products in a heart healthy meal plan.    Personal Goal #2 Pt to eat a variety of non-starchy vegetables and fruits      Intervention Plan   Intervention Nutrition handout(s) given to patient.;Prescribe, educate and counsel regarding individualized specific dietary modifications aiming towards targeted core components such as weight, hypertension, lipid management,  diabetes, heart failure and other comorbidities.    Expected Outcomes Short Term Goal: A plan has been developed with personal nutrition goals set during dietitian appointment.           Nutrition Discharge:  Nutrition Assessments - 07/26/19 1026      MEDFICTS Scores   Pre Score 33           Education Questionnaire Score:  Knowledge Questionnaire Score - 09/09/19 0903      Knowledge Questionnaire Score   Pre Score 23/24    Post Score 24/24           Goals reviewed with patient; copy given to patient.

## 2019-09-23 ENCOUNTER — Encounter (HOSPITAL_COMMUNITY): Payer: Self-pay | Admitting: *Deleted

## 2019-09-23 ENCOUNTER — Other Ambulatory Visit: Payer: Self-pay

## 2019-09-23 ENCOUNTER — Emergency Department (HOSPITAL_COMMUNITY)
Admission: EM | Admit: 2019-09-23 | Discharge: 2019-09-24 | Disposition: A | Discharge status: Home / Self Care | Payer: BC Managed Care – PPO | Attending: Emergency Medicine | Admitting: Emergency Medicine

## 2019-09-23 ENCOUNTER — Emergency Department (HOSPITAL_COMMUNITY): Payer: BC Managed Care – PPO

## 2019-09-23 DIAGNOSIS — R0789 Other chest pain: Secondary | ICD-10-CM | POA: Diagnosis not present

## 2019-09-23 DIAGNOSIS — R079 Chest pain, unspecified: Secondary | ICD-10-CM | POA: Diagnosis not present

## 2019-09-23 DIAGNOSIS — Z5321 Procedure and treatment not carried out due to patient leaving prior to being seen by health care provider: Secondary | ICD-10-CM | POA: Insufficient documentation

## 2019-09-23 LAB — BASIC METABOLIC PANEL
Anion gap: 10 (ref 5–15)
BUN: 13 mg/dL (ref 6–20)
CO2: 24 mmol/L (ref 22–32)
Calcium: 9.5 mg/dL (ref 8.9–10.3)
Chloride: 106 mmol/L (ref 98–111)
Creatinine, Ser: 1 mg/dL (ref 0.61–1.24)
GFR calc Af Amer: >60 mL/min (ref >60)
GFR calc non Af Amer: >60 mL/min (ref >60)
Glucose, Bld: 106 mg/dL — ABNORMAL HIGH (ref 70–99)
Potassium: 3.7 mmol/L (ref 3.5–5.1)
Sodium: 140 mmol/L (ref 135–145)

## 2019-09-23 LAB — CBC
HCT: 45 % (ref 39.0–52.0)
Hemoglobin: 15.1 g/dL (ref 13.0–17.0)
MCH: 30.5 pg (ref 26.0–34.0)
MCHC: 33.6 g/dL (ref 30.0–36.0)
MCV: 90.9 fL (ref 80.0–100.0)
Platelets: 230 10*3/uL (ref 150–400)
RBC: 4.95 MIL/uL (ref 4.22–5.81)
RDW: 12.1 % (ref 11.5–15.5)
WBC: 8.1 10*3/uL (ref 4.0–10.5)
nRBC: 0 % (ref 0.0–0.2)

## 2019-09-23 LAB — TROPONIN I (HIGH SENSITIVITY)
Troponin I (High Sensitivity): 9 ng/L (ref <18)
Troponin I (High Sensitivity): 11 ng/L (ref <18)

## 2019-09-23 MED ORDER — SODIUM CHLORIDE 0.9% FLUSH: 3.0000 mL | Freq: Once | INTRAVENOUS | Status: DC

## 2019-09-23 NOTE — ED Triage Notes (Signed)
Pt is here with intermittent chest pain since Friday. Pt states nitro does help relieve it at times and today he had it start went home and rested.  Pain got better.  Pt has 4 stents and had MI in 05/29/19. Pt states feels like someone is just laying their hand on his chest". EMS gave patient ASA 324mg  en route to ED.

## 2019-09-24 ENCOUNTER — Other Ambulatory Visit: Payer: BC Managed Care – PPO

## 2019-09-24 ENCOUNTER — Ambulatory Visit (HOSPITAL_BASED_OUTPATIENT_CLINIC_OR_DEPARTMENT_OTHER): Payer: BC Managed Care – PPO

## 2019-09-24 DIAGNOSIS — E785 Hyperlipidemia, unspecified: Secondary | ICD-10-CM | POA: Diagnosis not present

## 2019-09-24 DIAGNOSIS — I519 Heart disease, unspecified: Secondary | ICD-10-CM | POA: Diagnosis not present

## 2019-09-24 DIAGNOSIS — Z79899 Other long term (current) drug therapy: Secondary | ICD-10-CM | POA: Diagnosis not present

## 2019-09-24 DIAGNOSIS — I2109 ST elevation (STEMI) myocardial infarction involving other coronary artery of anterior wall: Secondary | ICD-10-CM

## 2019-09-24 LAB — ECHOCARDIOGRAM COMPLETE
Area-P 1/2: 2.95 cm2
S' Lateral: 3.2 cm

## 2019-09-24 NOTE — ED Notes (Addendum)
Pt states he has an echo at 9 this am and is probably going to leave to go to that. Staff taking out IV

## 2019-09-24 NOTE — ED Notes (Signed)
lwbs

## 2019-09-25 LAB — BASIC METABOLIC PANEL WITH GFR
BUN/Creatinine Ratio: 12 (ref 9–20)
BUN: 12 mg/dL (ref 6–24)
CO2: 25 mmol/L (ref 20–29)
Calcium: 9.7 mg/dL (ref 8.7–10.2)
Chloride: 104 mmol/L (ref 96–106)
Creatinine, Ser: 1.02 mg/dL (ref 0.76–1.27)
GFR calc Af Amer: 97 mL/min/{1.73_m2}
GFR calc non Af Amer: 84 mL/min/{1.73_m2}
Glucose: 97 mg/dL (ref 65–99)
Potassium: 5 mmol/L (ref 3.5–5.2)
Sodium: 142 mmol/L (ref 134–144)

## 2019-09-25 LAB — LIPID PANEL
Chol/HDL Ratio: 2.8 ratio (ref 0.0–5.0)
Cholesterol, Total: 84 mg/dL — ABNORMAL LOW (ref 100–199)
HDL: 30 mg/dL — ABNORMAL LOW
LDL Chol Calc (NIH): 42 mg/dL (ref 0–99)
Triglycerides: 47 mg/dL (ref 0–149)
VLDL Cholesterol Cal: 12 mg/dL (ref 5–40)

## 2019-09-25 LAB — HEPATIC FUNCTION PANEL
ALT: 95 IU/L — ABNORMAL HIGH (ref 0–44)
AST: 40 IU/L (ref 0–40)
Albumin: 4.4 g/dL (ref 3.8–4.9)
Alkaline Phosphatase: 108 IU/L (ref 48–121)
Bilirubin Total: 0.7 mg/dL (ref 0.0–1.2)
Bilirubin, Direct: 0.19 mg/dL (ref 0.00–0.40)
Total Protein: 6.8 g/dL (ref 6.0–8.5)

## 2019-09-25 LAB — CBC
Hematocrit: 46 % (ref 37.5–51.0)
Hemoglobin: 15.7 g/dL (ref 13.0–17.7)
MCH: 31.8 pg (ref 26.6–33.0)
MCHC: 34.1 g/dL (ref 31.5–35.7)
MCV: 93 fL (ref 79–97)
Platelets: 238 10*3/uL (ref 150–450)
RBC: 4.94 x10E6/uL (ref 4.14–5.80)
RDW: 12.3 % (ref 11.6–15.4)
WBC: 10.3 10*3/uL (ref 3.4–10.8)

## 2019-09-25 LAB — SPECIMEN STATUS REPORT

## 2019-09-26 ENCOUNTER — Other Ambulatory Visit: Payer: Self-pay

## 2019-09-26 ENCOUNTER — Ambulatory Visit (INDEPENDENT_AMBULATORY_CARE_PROVIDER_SITE_OTHER): Payer: BC Managed Care – PPO | Admitting: Cardiology

## 2019-09-26 ENCOUNTER — Encounter: Payer: Self-pay | Admitting: Cardiology

## 2019-09-26 VITALS — BP 108/82 | HR 55 | Ht 67.0 in | Wt 172.0 lb

## 2019-09-26 DIAGNOSIS — I255 Ischemic cardiomyopathy: Secondary | ICD-10-CM

## 2019-09-26 DIAGNOSIS — R7401 Elevation of levels of liver transaminase levels: Secondary | ICD-10-CM | POA: Diagnosis not present

## 2019-09-26 DIAGNOSIS — E785 Hyperlipidemia, unspecified: Secondary | ICD-10-CM

## 2019-09-26 DIAGNOSIS — I25110 Atherosclerotic heart disease of native coronary artery with unstable angina pectoris: Secondary | ICD-10-CM

## 2019-09-26 DIAGNOSIS — I10 Essential (primary) hypertension: Secondary | ICD-10-CM

## 2019-09-26 DIAGNOSIS — I5041 Acute combined systolic (congestive) and diastolic (congestive) heart failure: Secondary | ICD-10-CM

## 2019-09-26 NOTE — Patient Instructions (Addendum)
Medication Instructions:   DECREASE TAKING  ATORVASTATIN TO 1/2 TABLET OF 8 0 MG  ( TOTAL OF 40 MG)   FOR ONE MONTH     DEPENDING OD  LAB RESULT  - WILL ALTERNATE  40 MG ONE WEEK THEN 80 MG  THE OTHER  ALT WEEKLY   *If you need a refill on your cardiac medications before your next appointment, please call your pharmacy*   Lab Work:  HEPATIC  PANEL IN ONE MONTH    LIPID AND HEPATIN IN 4 MONTHS   - FASTING--- WILL MAIL LABSLIP  If you have labs (blood work) drawn today and your tests are completely normal, you will receive your results only by: Marland Kitchen MyChart Message (if you have MyChart) OR . A paper copy in the mail If you have any lab test that is abnormal or we need to change your treatment, we will call you to review the results.   Testing/Procedures: NOT NEEDED   Follow-Up: At [facility], you and your health needs are our priority.  As part of our continuing mission to provide you with exceptional heart care, we have created designated Provider Care Teams.  These Care Teams include your primary Cardiologist (physician) and Advanced Practice Providers (APPs -  Physician Assistants and Nurse Practitioners) who all work together to provide you with the care you need, when you need it.  We recommend signing up for the patient portal called "MyChart".  Sign up information is provided on this After Visit Summary.  MyChart is used to connect with patients for Virtual Visits (Telemedicine).  Patients are able to view lab/test results, encounter notes, upcoming appointments, etc.  Non-urgent messages can be sent to your provider as well.   To learn more about what you can do with MyChart, go to [email or web address].    Your next appointment:   4 month(s)  DEC 2021 The format for your next appointment:   In Person  Provider:   [name], MD   Other Instructions

## 2019-09-26 NOTE — Progress Notes (Signed)
Primary Care Provider: Patient, No Pcp Per Cardiologist: [name], MD Electrophysiologist: None  Clinic Note: Chief Complaint  Patient presents with  . Follow-up    Echo results  . Chest Pain    No more chest pain after 09/23/2019 ER visit  . Coronary Artery Disease    Multivessel PCI    HPI:    Valentin Benney is a 54 y.o. male with a PMH notable for MV CAD (anterior STEMI April 2021-> 3 overlapping DES P-M LAD, 1 DES OM2, CTO of RPDA with L-R collaterals, med therapy) below who presents today for 48-month follow-up.  Arzell Mcgeehan was last seen on Jun 24, 2019 by Bailey Mech, NP.  He had multiple questions about returning to work medications etc.  He was not any chest pain or pressure no dyspnea exertion.  Chronic left shoulder tingling pain. -->  No medication changes made.  Labs ordered and follow-up echo ordered for 3 months.  Recent Hospitalizations:   09/23/2019: Micah Flesher to emergency room for chest pain about a half what he had the time of his MI.  He took nitroglycerin with no help.  It was not increased with exercise.  He went to emergency room and waited for so long that he decided he would just leave to go get his Echo done.  Reviewed  CV studies:    The following studies were reviewed today: (if available, images/films reviewed: From Epic Chart or Care Everywhere)  Cath 05/29/2019 : Severe Three-Vessel Disease:    CULPRIT LESION 100% LAD after SP1 (long lesion); 90% proximal OM 2 (with moderate LPL), likely 100 and CTO of RPDA (high bifurcation, fills via left to right collateral flow) ? Successful DES PCI of extensive proximal to mid LAD using 3 overlapping Resolute Onyx DES stents (3.0 mm x 12 mm, 2.5 mm x 38 mm, and 2.25 mm x 15 mm -> entire segment postdilated from 3.65 mm down to 2.8 mm)  ? 2nd LESION: OM 2 with Resolute Onyx DES 2.0 mm x 12 mm--postdilated to 2.2 mm) ? 3rd LESION: RPDA lesion is 100% ~ CTO stenosed with the distal segment somewhat  filled via left to right collaterals --> treat medically.  Moderate severely reduced EF estimated as 35 to 40% with mid to apical anterior hypo to akinesis = ISCHEMIC CARDIOMYOPATHY and ACUTE SYSTOLIC HEART FAILURE  Moderate elevated LVEDP of 22 mm.  ACUTE DIASTOLIC HEART FAILURE  Diagnostic  Intervention     Echo 05/30/2019: (Anterior STEMI) --> EF 35 to 40%.  Entire anterior wall, mid and distal anterior septum and apex are akinetic.  Mild LVH, GR 1 DD.  Echo 09/2019: EF ~ 55-60%, Mid-apical Inferoseptal HK & AK of apical and anteroseptal wall.  Gr II DD.  Normal RV size and function.  Mild aortic sclerosis.  Interval History:   "[name]" returns here today for follow-up stating he is doing pretty well.  He had an episode about 3 weeks ago where he felt quite dizzy and lightheaded (on July 24).  He is not really sure what happened but things cleared up pretty well.  He had an episode of chest discomfort on August 9 went to the emergency room, but left after waiting for long period time.  He said he took nitroglycerin and it did relieve again better.  That pain is not made worse with exertion.  He has not had any further symptoms since then.  He says every now he will get some shortness of breath, but  nothing significant.  He is pretty much back at work.  He completed cardiac rehab.  Cardiovascular Review of Symptoms (Summary): positive for - 1 episode of chest pain not relieved with nitroglycerin.  Not made worse with exertion.  Intermittent dyspnea but not with routine exertion.  Also intermittent dizziness, likely related to being out in the hot weather. negative for - dyspnea on exertion, edema, irregular heartbeat, orthopnea, palpitations, paroxysmal nocturnal dyspnea, rapid heart rate or Syncope/near syncope (although has had some dizziness, and noticed near syncope), TIA/amaurosis fugax, claudication.  The patient does not have symptoms concerning for COVID-19 infection (fever, chills,  cough, or new shortness of breath).  The patient is practicing social distancing & Masking.  Immunization History  Administered Date(s) Administered  . Moderna SARS-COVID-2 Vaccination 06/26/2019, 07/07/2019    REVIEWED OF SYSTEMS   Review of Systems  Constitutional: Negative for malaise/fatigue and weight loss.  HENT: Negative for nosebleeds.   Respiratory: Positive for shortness of breath.   Gastrointestinal: Negative for blood in stool and melena.  Genitourinary: Negative for hematuria.  Musculoskeletal: Positive for joint pain (Normal arthritis pains). Negative for falls.  Neurological: Positive for dizziness. Negative for focal weakness and weakness.  Psychiatric/Behavioral: Negative.    I have reviewed and (if needed) personally updated the patient's problem list, medications, allergies, past medical and surgical history, social and family history.   PAST MEDICAL HISTORY   Past Medical History:  Diagnosis Date  . History of acute anterior wall MI 05/29/2019   100% LAD after SP1 -> (long lesion); 90% prox OM 2 (mod LPL 60%), ~100% CTO of RPDA (high bifurcation, fills via L-R collateral flow) - 3 overlapping DES in LAD & 1 in OM2  . Hyperlipidemia LDL goal <70   . Multiple vessel coronary artery disease 05/29/2019   Cath 05/29/2019 : Anterior STEMI -> Severe Three-Vessel Disease:  CULPRIT LESION 100% LAD after SP1 (long); 90% pOM 2 (with mod LPL), ~100 CTO of RPDA (high bifurcation, L-R collateral); Successful DES PCI p-mLAD w/ 3 overlapping Resolute Onyx DES (3.0 x 12, 2.5 x 38, & 2.25 x 15 --> 3.65-2.8 mm), OM2 Resolute Onyx DES 2 x 12 - 2.2 mm.     PAST SURGICAL HISTORY   Past Surgical History:  Procedure Laterality Date  . CORONARY STENT INTERVENTION N/A 05/29/2019   Procedure: CORONARY STENT INTERVENTION;  Surgeon: Marykay Lex, MD;  Location: [facility] INVASIVE CV LAB;  Service: Cardiovascular;; 2nd LESION: OM 2 with Resolute Onyx DES 2.0 mm x 12 mm--postdilated to 2.2 mm)    . CORONARY/GRAFT ACUTE MI REVASCULARIZATION N/A 05/29/2019   Procedure: CORONARY/GRAFT ACUTE MI REVASCULARIZATION;  Surgeon: Marykay Lex, MD;  Location: [facility] INVASIVE CV LAB;  Service: Cardiovascular;  Successful DES PCI of extensive proximal to mid LAD using 3 overlapping Resolute Onyx DES stents (3.0 mm x 12 mm, 2.5 mm x 38 mm, and 2.25 mm x 15 mm -> entire segment postdilated from 3.65 mm down to 2.8 mm)   . LEFT HEART CATH AND CORONARY ANGIOGRAPHY N/A 05/29/2019   Procedure: LEFT HEART CATH AND CORONARY ANGIOGRAPHY;  Surgeon: Marykay Lex, MD;  Location: [facility] INVASIVE CV LAB;  Service: Cardiovascular;; CULPRIT LESION 100% LAD after SP1 (long lesion); 90% proximal OM 2 (with moderate LPL), 100% CTO of RPDA (high bifurcation, fills via L-R collaterals; EF ~35-40% mid-apical Hypo-AK. LVEDP 22 mmHg.  Marland Kitchen TRANSTHORACIC ECHOCARDIOGRAM  09/2019   EF ~ 55-60%, Mid-apical Inferoseptal HK & AK of apical and anteroseptal wall.  Gr  II DD.  Normal RV size and function.  Mild aortic sclerosis.  . TRANSTHORACIC ECHOCARDIOGRAM  05/30/2019    (Anterior STEMI) --> EF 35 to 40%.  Entire anterior wall, mid and distal anterior septum and apex are akinetic.  Mild LVH, GR 1 DD.    MEDICATIONS/ALLERGIES   Current Meds  Medication Sig  . aspirin 81 MG chewable tablet Chew 1 tablet (81 mg total) by mouth daily.  Marland Kitchen atorvastatin (LIPITOR) 80 MG tablet Take 1 tablet (80 mg total) by mouth daily at 6 PM.  . carvedilol (COREG) 6.25 MG tablet Take 1 tablet (6.25 mg total) by mouth 2 (two) times daily with a meal.  . losartan (COZAAR) 25 MG tablet Take 0.5 tablets (12.5 mg total) by mouth daily.  . nitroGLYCERIN (NITROSTAT) 0.4 MG SL tablet Place 1 tablet (0.4 mg total) under the tongue every 5 (five) minutes as needed for chest pain (up to 3 doses. If taking 3rd dose, call 911.).  Marland Kitchen ticagrelor (BRILINTA) 90 MG TABS tablet Take 1 tablet (90 mg total) by mouth 2 (two) times daily.  . [DISCONTINUED] cetirizine (ZYRTEC) 10  MG tablet Take 10 mg by mouth daily as needed for allergies. Takes every 2-3 days    No Known Allergies  SOCIAL HISTORY/FAMILY HISTORY   Social History   Tobacco Use  . Smoking status: Never Smoker  . Smokeless tobacco: Never Used  Substance Use Topics  . Alcohol use: Never  . Drug use: Never   Social History   Social History Narrative   Lives with his wife in [place].      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   family history includes Hypertension in his mother.  OBJCTIVE -PE, EKG, labs   Wt Readings from Last 3 Encounters:  09/26/19 172 lb (78 kg)  09/13/19 174 lb 2.6 oz (79 kg)  07/26/19 173 lb (78.5 kg)    Physical Exam: BP 108/82 (BP Location: Left Arm, Patient Position: Sitting, Cuff Size: Normal)   Pulse (!) 55   Ht  (1.702 m)   Wt 172 lb (78 kg)   BMI 26.94 kg/m  Physical Exam Vitals reviewed.  Constitutional:      General: He is not in acute distress.    Appearance: Normal appearance. He is normal weight. He is not ill-appearing.  HENT:     Head: Normocephalic and atraumatic.  Neck:     Vascular: No carotid bruit, hepatojugular reflux or JVD.  Cardiovascular:     Rate and Rhythm: Regular rhythm. Bradycardia present.  No extrasystoles are present.    Chest Wall: PMI is not displaced.     Pulses: Normal pulses and intact distal pulses.     Heart sounds: Normal heart sounds. No murmur heard.  No friction rub. No gallop.   Pulmonary:     Effort: Pulmonary effort is normal. No respiratory distress.     Breath sounds: Normal breath sounds.  Chest:     Chest wall: No tenderness.  Abdominal:     General: Abdomen is flat. Bowel sounds are normal.     Palpations: Abdomen is soft. There is no mass (No HSM or bruit).  Musculoskeletal:        General: No swelling. Normal range of motion.     Cervical back: Normal range of motion.  Neurological:     General: No focal deficit present.     Mental Status: He is alert  and oriented to person,  place, and time.  Psychiatric:        Mood and Affect: Mood normal.        Behavior: Behavior normal.        Thought Content: Thought content normal.        Judgment: Judgment normal.     Adult ECG Report 09/23/2019: Rate: 63 ;  Rhythm: normal sinus rhythm and Borderline short PR interval.  Anteroseptal infarct, age undetermined.  Otherwise normal axis, intervals and durations.;   Narrative Interpretation: Stable EKG compared to 05/31/2019  Recent Labs:    Lab Results  Component Value Date   CHOL 84 (L) 09/24/2019   HDL 30 (L) 09/24/2019   LDLCALC 42 09/24/2019   TRIG 47 09/24/2019   CHOLHDL 2.8 09/24/2019   Lab Results  Component Value Date   ALT 95 (H) 09/24/2019   AST 40 09/24/2019   ALKPHOS 108 09/24/2019   BILITOT 0.7 09/24/2019     Lab Results  Component Value Date   CREATININE 1.02 09/24/2019   BUN 12 09/24/2019   NA 142 09/24/2019   K 5.0 09/24/2019   CL 104 09/24/2019   CO2 25 09/24/2019   Lab Results  Component Value Date   TSH 1.221 05/29/2019    ASSESSMENT/PLAN    Problem List Items Addressed This Visit    Coronary artery disease involving native coronary artery of native heart with unstable angina pectoris (HCC) (Chronic)    Presented with acute anterior MI found to have occluded RCA with severe OM disease and CTO of the PDA.  3 overlapping DES stents to the LAD with a stent in the OM 2.  Occluded RPDA treated with medical management. 1 episode of chest pain probably not anginal in nature based on the fact that it was not made worse with exertion or improved with Seldinger nitroglycerin.  He did well with cardiac rehab and is now back to full work.  Plan:  Continue current dose of carvedilol and losartan.  With current blood pressure not able to titrate further.  Continue statin, with elevated LFTs, will reduce dose and reevaluate  Continue aspirin plus Brilinta (DAPT)-plan will be to continue until April 2022 at which  point we would reduce to 60 mg daily Brilinta for maintenance.    As of May 2022, okay to hold Brilinta 5-7 days preop for surgeries or procedures.      Relevant Orders   Hepatic function panel   Hepatic function panel   Lipid panel   Ischemic cardiomyopathy - Primary (Chronic)    Initially reduced EF in the setting of MI.  Now resolved with 72-month follow-up echo showing preserved EF.  He still has expected wall motion normalities from his MI, but that is unexpected based on the extent of disease.      Relevant Orders   Lipid panel   Hyperlipidemia LDL goal <70 (Chronic)    Most recent lipid panel was remarkable for reduction in LDL, however ALT went up quite a bit.  Not sure what this is fully about.  Would like to recheck in about a month after we cut back atorvastatin dose to 40 mg.  Pending those results we may have to adjust medications more or if the LFTs are better, we can go to alternating 1 week of 80 mg in 1 week and 40 mg until we check lipids in 3 to 4 months.      Relevant Orders   Hepatic function panel   Hepatic function panel  Lipid panel   Essential hypertension (Chronic)   Acute combined systolic and diastolic heart failure (HCC)    During his initial presentation, he did have acute combined systolic diastolic heart failure.  Now both have resolved.  EF is improved back to within normal limits and no signs or symptoms of diastolic heart failure with PND orthopnea.. With multivessel disease he clearly has diastolic dysfunction.  Continue to monitor blood pressures and adjust meds as tolerated..  Continue beta-blocker and ARB at current dose.       Elevated transaminase level    I am little worried about the ALT being elevated.  We will recheck labs in about a month.  Plan will be to reduce statin dose to 40 mg prior to this lab check.   We we will readdress dosing once those labs are checked.      Relevant Orders   Hepatic function panel   Hepatic function  panel   Lipid panel       COVID-19 Education: The signs and symptoms of COVID-19 were discussed with the patient and how to seek care for testing (follow up with PCP or arrange E-visit).   The importance of social distancing and COVID-19 vaccination was discussed today.--He was happy to announce that he is pleased with his vaccine.  I spent a total of 24 minutes with the patien - in direct patient consultation.  Additional time spent with chart review  / charting (studies, outside notes, etc): 18 min--> echocardiogram results reviewed.  I also reviewed the cath films.  PSH updated. Total Time: 42 min   Current medicines are reviewed at length with the patient today.  (+/- concerns) N/A  Notice: This dictation was prepared with Dragon dictation along with smaller phrase technology. Any transcriptional errors that result from this process are unintentional and may not be corrected upon review.  Patient Instructions / Medication Changes & Studies & Tests Ordered   Patient Instructions  Medication Instructions:   DECREASE TAKING  ATORVASTATIN TO 1/2 TABLET OF 8 0 MG  ( TOTAL OF 40 MG)   FOR ONE MONTH     DEPENDING OD  LAB RESULT  - WILL ALTERNATE  40 MG ONE WEEK THEN 80 MG  THE OTHER  ALT WEEKLY   *If you need a refill on your cardiac medications before your next appointment, please call your pharmacy*   Lab Work:  HEPATIC  PANEL IN ONE MONTH    LIPID AND HEPATIN IN 4 MONTHS   - FASTING--- WILL MAIL LABSLIP  If you have labs (blood work) drawn today and your tests are completely normal, you will receive your results only by: Marland Kitchen. MyChart Message (if you have MyChart) OR . A paper copy in the mail If you have any lab test that is abnormal or we need to change your treatment, we will call you to review the results.   Testing/Procedures: NOT NEEDED   Follow-Up: At [facility], you and your health needs are our priority.  As part of our continuing mission to provide you with  exceptional heart care, we have created designated Provider Care Teams.  These Care Teams include your primary Cardiologist (physician) and Advanced Practice Providers (APPs -  Physician Assistants and Nurse Practitioners) who all work together to provide you with the care you need, when you need it.  We recommend signing up for the patient portal called "MyChart".  Sign up information is provided on this After Visit Summary.  MyChart is used to  connect with patients for Virtual Visits (Telemedicine).  Patients are able to view lab/test results, encounter notes, upcoming appointments, etc.  Non-urgent messages can be sent to your provider as well.   To learn more about what you can do with MyChart, go to [email or web address].    Your next appointment:   4 month(s)  DEC 2021 The format for your next appointment:   In Person  Provider:   [name], MD   Other Instructions    Studies Ordered:   Orders Placed This Encounter  Procedures  . Hepatic function panel  . Hepatic function panel  . Lipid panel     [name], M.D., M.S. Interventional Cardiologist   Pager # [phone] Phone # [phone] [address] [place], [place] [address]   Thank you for choosing Heartcare at [facility]!!

## 2019-09-29 ENCOUNTER — Encounter: Payer: Self-pay | Admitting: Cardiology

## 2019-09-30 ENCOUNTER — Encounter: Payer: Self-pay | Admitting: Cardiology

## 2019-09-30 NOTE — Assessment & Plan Note (Signed)
Initially reduced EF in the setting of MI.  Now resolved with 20-month follow-up echo showing preserved EF.  He still has expected wall motion normalities from his MI, but that is unexpected based on the extent of disease.

## 2019-09-30 NOTE — Assessment & Plan Note (Signed)
Most recent lipid panel was remarkable for reduction in LDL, however ALT went up quite a bit.  Not sure what this is fully about.  Would like to recheck in about a month after we cut back atorvastatin dose to 40 mg.  Pending those results we may have to adjust medications more or if the LFTs are better, we can go to alternating 1 week of 80 mg in 1 week and 40 mg until we check lipids in 3 to 4 months.

## 2019-09-30 NOTE — Assessment & Plan Note (Signed)
I am little worried about the ALT being elevated.  We will recheck labs in about a month.  Plan will be to reduce statin dose to 40 mg prior to this lab check.   We we will readdress dosing once those labs are checked.

## 2019-09-30 NOTE — Assessment & Plan Note (Signed)
Presented with acute anterior MI found to have occluded RCA with severe OM disease and CTO of the PDA.  3 overlapping DES stents to the LAD with a stent in the OM 2.  Occluded RPDA treated with medical management. 1 episode of chest pain probably not anginal in nature based on the fact that it was not made worse with exertion or improved with Seldinger nitroglycerin.  He did well with cardiac rehab and is now back to full work.  Plan:  Continue current dose of carvedilol and losartan.  With current blood pressure not able to titrate further.  Continue statin, with elevated LFTs, will reduce dose and reevaluate  Continue aspirin plus Brilinta (DAPT)-plan will be to continue until April 2022 at which point we would reduce to 60 mg daily Brilinta for maintenance.    As of May 2022, okay to hold Brilinta 5-7 days preop for surgeries or procedures.

## 2019-09-30 NOTE — Assessment & Plan Note (Signed)
During his initial presentation, he did have acute combined systolic diastolic heart failure.  Now both have resolved.  EF is improved back to within normal limits and no signs or symptoms of diastolic heart failure with PND orthopnea.. With multivessel disease he clearly has diastolic dysfunction.  Continue to monitor blood pressures and adjust meds as tolerated..  Continue beta-blocker and ARB at current dose.

## 2019-10-01 ENCOUNTER — Telehealth: Payer: Self-pay | Admitting: *Deleted

## 2019-10-01 DIAGNOSIS — R7401 Elevation of levels of liver transaminase levels: Secondary | ICD-10-CM

## 2019-10-01 DIAGNOSIS — I5041 Acute combined systolic (congestive) and diastolic (congestive) heart failure: Secondary | ICD-10-CM

## 2019-10-01 DIAGNOSIS — E785 Hyperlipidemia, unspecified: Secondary | ICD-10-CM

## 2019-10-01 DIAGNOSIS — I255 Ischemic cardiomyopathy: Secondary | ICD-10-CM

## 2019-10-01 NOTE — Telephone Encounter (Signed)
-----   Message from Marykay Lex, MD sent at 09/25/2019  5:57 PM EDT ----- Results results of labs:   -> Total cholesterol is elevated at 84 with a bad cholesterol/LDL of 42.  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 -->  I think we can reduce the dose of atorvastatin to 1/2 tablet daily.  Would recheck cholesterol levels in 3 months.  Liver function tests look pretty good.  The ALT level is a little high.  We can monitor this with follow-up labs in 3 months as well.  Chemistry panel looks pretty good.  Kidney function is normal and blood sugar levels look better.  [name], MD

## 2019-10-01 NOTE — Telephone Encounter (Signed)
Patient reviewed via mychart . labslip mailed to patient  Schedule to do  Recheck in Nov 2021

## 2019-10-10 ENCOUNTER — Other Ambulatory Visit: Payer: Self-pay | Admitting: *Deleted

## 2019-10-10 DIAGNOSIS — I255 Ischemic cardiomyopathy: Secondary | ICD-10-CM

## 2019-10-10 DIAGNOSIS — I5041 Acute combined systolic (congestive) and diastolic (congestive) heart failure: Secondary | ICD-10-CM

## 2019-10-10 DIAGNOSIS — R7401 Elevation of levels of liver transaminase levels: Secondary | ICD-10-CM

## 2019-10-10 DIAGNOSIS — E785 Hyperlipidemia, unspecified: Secondary | ICD-10-CM

## 2019-10-30 DIAGNOSIS — E785 Hyperlipidemia, unspecified: Secondary | ICD-10-CM | POA: Diagnosis not present

## 2019-10-30 DIAGNOSIS — I25110 Atherosclerotic heart disease of native coronary artery with unstable angina pectoris: Secondary | ICD-10-CM | POA: Diagnosis not present

## 2019-10-30 DIAGNOSIS — R7401 Elevation of levels of liver transaminase levels: Secondary | ICD-10-CM | POA: Diagnosis not present

## 2019-10-30 LAB — HEPATIC FUNCTION PANEL
ALT: 33 IU/L (ref 0–44)
AST: 25 IU/L (ref 0–40)
Albumin: 4.4 g/dL (ref 3.8–4.9)
Alkaline Phosphatase: 101 IU/L (ref 44–121)
Bilirubin Total: 0.6 mg/dL (ref 0.0–1.2)
Bilirubin, Direct: 0.18 mg/dL (ref 0.00–0.40)
Total Protein: 6.8 g/dL (ref 6.0–8.5)

## 2019-11-02 DIAGNOSIS — R509 Fever, unspecified: Secondary | ICD-10-CM | POA: Diagnosis not present

## 2019-11-02 DIAGNOSIS — Z20828 Contact with and (suspected) exposure to other viral communicable diseases: Secondary | ICD-10-CM | POA: Diagnosis not present

## 2019-11-02 DIAGNOSIS — R070 Pain in throat: Secondary | ICD-10-CM | POA: Diagnosis not present

## 2020-01-14 ENCOUNTER — Other Ambulatory Visit: Payer: Self-pay | Admitting: Cardiology

## 2020-01-14 DIAGNOSIS — E785 Hyperlipidemia, unspecified: Secondary | ICD-10-CM | POA: Diagnosis not present

## 2020-01-14 DIAGNOSIS — I255 Ischemic cardiomyopathy: Secondary | ICD-10-CM | POA: Diagnosis not present

## 2020-01-14 DIAGNOSIS — I5041 Acute combined systolic (congestive) and diastolic (congestive) heart failure: Secondary | ICD-10-CM | POA: Diagnosis not present

## 2020-01-14 DIAGNOSIS — R7401 Elevation of levels of liver transaminase levels: Secondary | ICD-10-CM | POA: Diagnosis not present

## 2020-01-16 LAB — LIPID PANEL
Chol/HDL Ratio: 3.3 ratio (ref 0.0–5.0)
Cholesterol, Total: 117 mg/dL (ref 100–199)
HDL: 35 mg/dL — ABNORMAL LOW (ref >39)
LDL Chol Calc (NIH): 65 mg/dL (ref 0–99)
Triglycerides: 87 mg/dL (ref 0–149)
VLDL Cholesterol Cal: 17 mg/dL (ref 5–40)

## 2020-01-16 LAB — HEPATIC FUNCTION PANEL
ALT: 27 IU/L (ref 0–44)
AST: 15 IU/L (ref 0–40)
Albumin: 4.4 g/dL (ref 3.8–4.9)
Alkaline Phosphatase: 95 IU/L (ref 44–121)
Bilirubin Total: 0.7 mg/dL (ref 0.0–1.2)
Bilirubin, Direct: 0.19 mg/dL (ref 0.00–0.40)
Total Protein: 6.7 g/dL (ref 6.0–8.5)

## 2020-01-23 ENCOUNTER — Encounter: Payer: Self-pay | Admitting: Cardiology

## 2020-01-23 ENCOUNTER — Other Ambulatory Visit: Payer: Self-pay

## 2020-01-23 ENCOUNTER — Ambulatory Visit (INDEPENDENT_AMBULATORY_CARE_PROVIDER_SITE_OTHER): Payer: BC Managed Care – PPO | Admitting: Cardiology

## 2020-01-23 VITALS — BP 114/78 | HR 55 | Ht 67.0 in | Wt 179.0 lb

## 2020-01-23 DIAGNOSIS — E785 Hyperlipidemia, unspecified: Secondary | ICD-10-CM

## 2020-01-23 DIAGNOSIS — I25110 Atherosclerotic heart disease of native coronary artery with unstable angina pectoris: Secondary | ICD-10-CM | POA: Diagnosis not present

## 2020-01-23 DIAGNOSIS — I10 Essential (primary) hypertension: Secondary | ICD-10-CM

## 2020-01-23 DIAGNOSIS — R7401 Elevation of levels of liver transaminase levels: Secondary | ICD-10-CM

## 2020-01-23 DIAGNOSIS — I255 Ischemic cardiomyopathy: Secondary | ICD-10-CM | POA: Diagnosis not present

## 2020-01-23 MED ORDER — ATORVASTATIN CALCIUM 80 MG PO TABS: ORAL_TABLET | ORAL | 3 refills | Status: DC

## 2020-01-23 NOTE — Patient Instructions (Signed)
Medication Instructions:   Alternate taking 80 mg  Of Atorvastatin with 40 mg ( 1/2 tablet) of Atorvastatin every other day   *If you need a refill on your cardiac medications before your next appointment, please call your pharmacy*   Lab Work: [facility] Lipid- April 2022  fasting   If you have labs (blood work) drawn today and your tests are completely normal, you will receive your results only by: Marland Kitchen MyChart Message (if you have MyChart) OR . A paper copy in the mail If you have any lab test that is abnormal or we need to change your treatment, we will call you to review the results.   Testing/Procedures:  Not needed  Follow-Up: At [facility], you and your health needs are our priority.  As part of our continuing mission to provide you with exceptional heart care, we have created designated Provider Care Teams.  These Care Teams include your primary Cardiologist (physician) and Advanced Practice Providers (APPs -  Physician Assistants and Nurse Practitioners) who all work together to provide you with the care you need, when you need it.     Your next appointment:   4 month(s)  The format for your next appointment:   In Person  Provider:   [name], MD

## 2020-01-23 NOTE — Progress Notes (Signed)
Primary Care Provider: Patient, No Pcp Per Cardiologist: Glenetta Hew, MD Electrophysiologist: None  Clinic Note: Chief Complaint  Patient presents with  . Follow-up    36-month doing well. No complaints  . Coronary Artery Disease    No angina  . Hyperlipidemia    Labs just checked   Problem List Items Addressed This Visit    Coronary artery disease involving native coronary artery of native heart with unstable angina pectoris (HCC) (Chronic)   Hyperlipidemia LDL goal <70 - Primary (Chronic)   Relevant Medications   atorvastatin (LIPITOR) 80 MG tablet   Other Relevant Orders   Lipid panel   Comprehensive metabolic panel   Essential hypertension (Chronic)     HPI:    [name] [name] a 54y.o. male with a PMH notable for MV CAD (Anterior STEMI 05/2019 -> 3 overlapping DES p-mLAD, 1 DES OM2, CTO of  rPDA w/ L-R collaterals - med Rx) & recovered ICM (EF up to 55-60% from 35-40%) who presents today for 4 month f/u.  [name] Brennemanwas last seen on September 26, 2019 (in part for a post hospital/ER follow-up for chest pain evaluation). He indicated that he was doing fairly well overall. He describes an episode of feeling dizzy lightheaded back in July but otherwise the 1 episode of chest pain on August 9 was taken to ER was not similar to his anginal pain. It was not relieved by nitroglycerin nor made worse with exertion. No further chest pain since. Only maybe little shortness of breath with exertion but not significant. Had completed cardiac rehab.  Atorvastatin reduced to 40 mg daily.  Recent Hospitalizations: None  Reviewed  CV studies:    The following studies were reviewed today: (if available, images/films reviewed: From Epic Chart or Care Everywhere) . None:   Interval History:   [name] Dunnawayreturns here today for 428-monthollow-up overall doing fairly well. He did mention that he got Covid in September but really did not have any significant  symptoms. Several members of the family were infected and he tested positive. Other than some bruising, the only thing he really notes is that occasional spells of dyspnea already has a hard time catching his breath. His not associate with exertion, dyspnea disease gasping for breath. The other thing he notes is occasional cramping but it is usually at nighttime.  CV Review of Symptoms (Summary): no chest pain or dyspnea on exertion positive for - Episodes of shortness of breath, bruising, cramping negative for - edema, irregular heartbeat, orthopnea, palpitations, paroxysmal nocturnal dyspnea, rapid heart rate or Rapid heart rates, syncope/near syncope, TIA/amaurosis fugax or claudication.  The patient does not have symptoms concerning for COVID-19 infection (fever, chills, cough, or new shortness of breath).   REVIEWED OF SYSTEMS   Review of Systems  Constitutional: Negative for malaise/fatigue and weight loss.  HENT: Negative for congestion and nosebleeds.   Respiratory: Positive for shortness of breath (Episodes of shortness of breath mostly at rest. Gasping).   Gastrointestinal: Negative for abdominal pain, blood in stool, constipation and melena.  Genitourinary: Negative for hematuria.  Musculoskeletal: Negative for falls and joint pain.  Neurological: Negative for dizziness, focal weakness and headaches.  Endo/Heme/Allergies: Bruises/bleeds easily.  Psychiatric/Behavioral: Negative for memory loss. The patient is not nervous/anxious and does not have insomnia.    I have reviewed and (if needed) personally updated the patient's problem list, medications, allergies, past medical and surgical history, social and family history.   PAST MEDICAL  HISTORY   Past Medical History:  Diagnosis Date  . History of acute anterior wall MI 05/29/2019   100% LAD after SP1 -> (long lesion); 90% prox OM 2 (mod LPL 60%), ~100% CTO of RPDA (high bifurcation, fills via L-R collateral flow) - 3  overlapping DES in LAD & 1 in OM2  . Hyperlipidemia LDL goal <70   . Multiple vessel coronary artery disease 05/29/2019   Cath 05/29/2019 : Anterior STEMI -> Severe Three-Vessel Disease:  CULPRIT LESION 100% LAD after SP1 (long); 90% pOM 2 (with mod LPL), ~100 CTO of RPDA (high bifurcation, L-R collateral); Successful DES PCI p-mLAD w/ 3 overlapping Resolute Onyx DES (3.0 x 12, 2.5 x 38, & 2.25 x 15 --> 3.65-2.8 mm), OM2 Resolute Onyx DES 2 x 12 - 2.2 mm.     PAST SURGICAL HISTORY   Past Surgical History:  Procedure Laterality Date  . CORONARY STENT INTERVENTION N/A 05/29/2019   Procedure: CORONARY STENT INTERVENTION;  Surgeon: Leonie Man, MD;  Location: [facility];  Service: Cardiovascular;; 2nd LESION: OM 2 with Resolute Onyx DES 2.0 mm x 12 mm--postdilated to 2.2 mm)  . CORONARY/GRAFT ACUTE MI REVASCULARIZATION N/A 05/29/2019   Procedure: CORONARY/GRAFT ACUTE MI REVASCULARIZATION;  Surgeon: Leonie Man, MD;  Location: [facility];  Service: Cardiovascular;  Successful DES PCI of extensive proximal to mid LAD using 3 overlapping Resolute Onyx DES stents (3.0 mm x 12 mm, 2.5 mm x 38 mm, and 2.25 mm x 15 mm -> entire segment postdilated from 3.65 mm down to 2.8 mm)   . LEFT HEART CATH AND CORONARY ANGIOGRAPHY N/A 05/29/2019   Procedure: LEFT HEART CATH AND CORONARY ANGIOGRAPHY;  Surgeon: Leonie Man, MD;  Location: [facility];  Service: Cardiovascular;; CULPRIT LESION 100% LAD after SP1 (long lesion); 90% proximal OM 2 (with moderate LPL), 100% CTO of RPDA (high bifurcation, fills via L-R collaterals; EF ~35-40% mid-apical Hypo-AK. LVEDP 22 mmHg.  Marland Kitchen TRANSTHORACIC ECHOCARDIOGRAM  09/2019   EF ~ 55-60%, Mid-apical Inferoseptal HK & AK of apical and anteroseptal wall.  Gr II DD.  Normal RV size and function.  Mild aortic sclerosis.  . TRANSTHORACIC ECHOCARDIOGRAM  05/30/2019    (Anterior STEMI) --> EF 35 to 40%.  Entire anterior wall, mid and distal anterior septum  and apex are akinetic.  Mild LVH, GR 1 DD.   05/29/19 Cath-PCI: 100% p-mLAD (3 overlapping DES: Resolute Onyx 3x12, 2.5x38, 2.25 x 15 --> 3.65-2.8 mm); 90% OM2 (Resolute Onyx des 2 x12 - 2.2 mm), rPDA 100% CTO (L-R collaterals - med Rx); EF ~35-40% w/ m-d(apical) anterior HK-AK, LVEDP 22 mmHg (Acute Combined CHF with Ischemic CM)   Intervention    Echo 09/2019 - EF up to 55-60% w/ mid-apical Inferoseptal HK & AK of apical anteroseptal wall c/w LAD MI. Gr II DD.  - EF up from 35-40% w/ entire Anterior HK-AK.   Immunization History  Administered Date(s) Administered  . Moderna Sars-Covid-2 Vaccination 06/26/2019, 07/07/2019    MEDICATIONS/ALLERGIES   Current Meds  Medication Sig  . aspirin 81 MG chewable tablet Chew 1 tablet (81 mg total) by mouth daily.  . carvedilol (COREG) 6.25 MG tablet Take 1 tablet (6.25 mg total) by mouth 2 (two) times daily with a meal.  . losartan (COZAAR) 25 MG tablet Take 0.5 tablets (12.5 mg total) by mouth daily.  . nitroGLYCERIN (NITROSTAT) 0.4 MG SL tablet Place 1 tablet (0.4 mg total) under the tongue every 5 (five)  minutes as needed for chest pain (up to 3 doses. If taking 3rd dose, call 911.).  Marland Kitchen ticagrelor (BRILINTA) 90 MG TABS tablet Take 1 tablet (90 mg total) by mouth 2 (two) times daily.  . [DISCONTINUED] atorvastatin (LIPITOR) 80 MG tablet Take 1 tablet (80 mg total) by mouth daily at 6 PM.    No Known Allergies  SOCIAL HISTORY/FAMILY HISTORY   Reviewed in Epic:  Pertinent findings: No new changes.  OBJCTIVE -PE, EKG, labs   Wt Readings from Last 3 Encounters:  01/23/20 179 lb (81.2 kg)  09/26/19 172 lb (78 kg)  09/13/19 174 lb 2.6 oz (79 kg)    Physical Exam: BP 114/78   Pulse (!) 55   Ht '5\' 7"'  (1.702 m)   Wt 179 lb (81.2 kg)   SpO2 98%   BMI 28.04 kg/m  Physical Exam Vitals reviewed.  Constitutional:      General: He is not in acute distress.    Appearance: Normal appearance. He is normal weight. He is not ill-appearing or  toxic-appearing.  HENT:     Head: Normocephalic and atraumatic.  Neck:     Vascular: No carotid bruit, hepatojugular reflux or JVD.  Cardiovascular:     Rate and Rhythm: Regular rhythm. Bradycardia present.  No extrasystoles are present.    Pulses: Intact distal pulses.     Heart sounds: Normal heart sounds. No murmur heard. No friction rub. No gallop. No S4 sounds.   Pulmonary:     Effort: Pulmonary effort is normal. No respiratory distress.     Breath sounds: Normal breath sounds.  Chest:     Chest wall: No tenderness.  Abdominal:     General: Bowel sounds are normal. There is no distension.     Palpations: Abdomen is soft. There is no mass.     Comments: No HSM  Musculoskeletal:        General: No swelling. Normal range of motion.     Cervical back: Normal range of motion and neck supple.  Neurological:     General: No focal deficit present.     Mental Status: He is alert and oriented to person, place, and time.  Psychiatric:        Mood and Affect: Mood normal.        Behavior: Behavior normal.        Thought Content: Thought content normal.        Judgment: Judgment normal.     Adult ECG Report  Rate: 55 ;  Rhythm: sinus bradycardia and ASMI - age undetermined.  Otherwise normal axis, intervals and durations.;   Narrative Interpretation: Stable   Recent Labs:  -AST 15, ALT 27, alk phos 95 Lab Results  Component Value Date   CHOL 117 01/14/2020   HDL 35 (L) 01/14/2020   LDLCALC 65 01/14/2020   TRIG 87 01/14/2020   CHOLHDL 3.3 01/14/2020   Lab Results  Component Value Date   CREATININE 1.02 09/24/2019   BUN 12 09/24/2019   NA 142 09/24/2019   K 5.0 09/24/2019   CL 104 09/24/2019   CO2 25 09/24/2019   Lab Results  Component Value Date   TSH 1.221 05/29/2019    ASSESSMENT/PLAN    Problem List Items Addressed This Visit    Coronary artery disease involving native coronary artery of native heart with unstable angina pectoris ([place]) - Primary (Chronic)     Significant LAD stenosis along with OM1 treated with a total of 4 stents. He has  an extensive PCI to the LAD, but I would like to continue long-term antiplatelet agent. PCI was in setting of STEMI in April 2021  Plan: Continue current dose of beta-blocker and ARB.  Alternate 80 mg and 40 mg atorvastatin every other day.  Continue DAPT (aspirin plus Brilinta) at current dose through April 2022, at which point we will reduce to 60 mg twice daily Brilinta versus 75 mg Plavix and stop aspirin.  As of May 2022, okay to hold Brilinta 5 days preop for surgeries or procedures.      Relevant Medications   atorvastatin (LIPITOR) 80 MG tablet   Other Relevant Orders   EKG 12-Lead (Completed)   Lipid panel   Comprehensive metabolic panel   Ischemic cardiomyopathy (Chronic)    Essentially resolved following PCI. Follow-up EF 55 to 60%. Now only with hypokinesis in the inferoapical wall which would probably go along more with the occluded PDA.  No active CHF symptoms.      Relevant Medications   atorvastatin (LIPITOR) 80 MG tablet   Hyperlipidemia LDL goal <70 (Chronic)    Repeat lipids showed LDL less than 70.  Plan: Alternate atorvastatin 80 mg and 40 mg every other day  Recheck lipids in 3 to 4 months.      Relevant Medications   atorvastatin (LIPITOR) 80 MG tablet   Other Relevant Orders   Lipid panel   Comprehensive metabolic panel   Essential hypertension (Chronic)    Blood pressure looks well controlled on current meds.   Plan: Continue carvedilol 6.25 mg twice daily along with losartan 12.5 mg daily.-> With systolic blood pressure in the 110s-120s, would not titrate losartan back to 25 mg.      Relevant Medications   atorvastatin (LIPITOR) 80 MG tablet   Elevated transaminase level    Follow-up LFTs showed normal ALT level.         COVID-19 Education: The signs and symptoms of COVID-19 were discussed with the patient and how to seek care for testing (follow up with  PCP or arrange E-visit).   The importance of social distancing and COVID-19 vaccination was discussed today. 2 min The patient is practicing social distancing & Masking.   I spent a total of 19mnutes with the patient spent in direct patient consultation.  Additional time spent with chart review  / charting (studies, outside notes, etc): 8 Total Time: 33 min   Current medicines are reviewed at length with the patient today.  (+/- concerns) n/a  This visit occurred during the SARS-CoV-2 public health emergency.  Safety protocols were in place, including screening questions prior to the visit, additional usage of staff PPE, and extensive cleaning of exam room while observing appropriate contact time as indicated for disinfecting solutions.  Notice: This dictation was prepared with Dragon dictation along with smaller phrase technology. Any transcriptional errors that result from this process are unintentional and may not be corrected upon review.  Patient Instructions / Medication Changes & Studies & Tests Ordered   Patient Instructions  Medication Instructions:   Alternate taking 80 mg  Of Atorvastatin with 40 mg ( 1/2 tablet) of Atorvastatin every other day   *If you need a refill on your cardiac medications before your next appointment, please call your pharmacy*   [facility]  [facility]   If you have labs (blood work) drawn today and your tests are completely normal, you will receive your results only by: .Marland KitchenMyChart Message (if you have MyChart) OR .  A paper copy in the mail If you have any lab test that is abnormal or we need to change your treatment, we will call you to review the results.   Testing/Procedures:  Not needed  Follow-Up: At [facility], you and your health needs are our priority.  As part of our continuing mission to provide you with exceptional heart care, we have created designated Provider Care Teams.  These Care Teams include your  primary Cardiologist (physician) and Advanced Practice Providers (APPs -  Physician Assistants and Nurse Practitioners) who all work together to provide you with the care you need, when you need it.     Your next appointment:   4 month(s)  The format for your next appointment:   In Person  Provider:   Glenetta Hew, MD       Studies Ordered:   Orders Placed This Encounter  Procedures  . Lipid panel  . Comprehensive metabolic panel  . EKG 12-Lead     Glenetta Hew, M.D., M.S. Interventional Cardiologist   Pager # [phone] Phone # [phone] [address] [place], [place] [address]   Thank you for choosing [facility]care at [facility]!!

## 2020-02-11 ENCOUNTER — Encounter: Payer: Self-pay | Admitting: Cardiology

## 2020-02-11 NOTE — Assessment & Plan Note (Signed)
Repeat lipids showed LDL less than 70.  Plan: Alternate atorvastatin 80 mg and 40 mg every other day  Recheck lipids in 3 to 4 months.

## 2020-02-11 NOTE — Assessment & Plan Note (Signed)
Follow-up LFTs showed normal ALT level.

## 2020-02-11 NOTE — Assessment & Plan Note (Signed)
Essentially resolved following PCI. Follow-up EF 55 to 60%. Now only with hypokinesis in the inferoapical wall which would probably go along more with the occluded PDA.  No active CHF symptoms.

## 2020-02-11 NOTE — Assessment & Plan Note (Signed)
Blood pressure looks well controlled on current meds.   Plan: Continue carvedilol 6.25 mg twice daily along with losartan 12.5 mg daily.-> With systolic blood pressure in the 110s-120s, would not titrate losartan back to 25 mg.

## 2020-02-11 NOTE — Assessment & Plan Note (Signed)
Significant LAD stenosis along with OM1 treated with a total of 4 stents. He has an extensive PCI to the LAD, but I would like to continue long-term antiplatelet agent. PCI was in setting of STEMI in April 2021  Plan: Continue current dose of beta-blocker and ARB.  Alternate 80 mg and 40 mg atorvastatin every other day.  Continue DAPT (aspirin plus Brilinta) at current dose through April 2022, at which point we will reduce to 60 mg twice daily Brilinta versus 75 mg Plavix and stop aspirin.  As of May 2022, okay to hold Brilinta 5 days preop for surgeries or procedures.

## 2020-02-28 ENCOUNTER — Ambulatory Visit: Payer: BC Managed Care – PPO | Admitting: Family Medicine

## 2020-04-06 ENCOUNTER — Ambulatory Visit: Payer: BC Managed Care – PPO | Admitting: Family Medicine

## 2020-05-14 ENCOUNTER — Encounter: Payer: Self-pay | Admitting: Family Medicine

## 2020-05-14 ENCOUNTER — Other Ambulatory Visit: Payer: Self-pay

## 2020-05-14 ENCOUNTER — Ambulatory Visit (INDEPENDENT_AMBULATORY_CARE_PROVIDER_SITE_OTHER): Payer: BC Managed Care – PPO | Admitting: Family Medicine

## 2020-05-14 ENCOUNTER — Ambulatory Visit (INDEPENDENT_AMBULATORY_CARE_PROVIDER_SITE_OTHER): Payer: BC Managed Care – PPO

## 2020-05-14 VITALS — BP 101/80 | HR 63 | Temp 97.3°F | Ht 67.0 in | Wt 182.2 lb

## 2020-05-14 DIAGNOSIS — R0683 Snoring: Secondary | ICD-10-CM

## 2020-05-14 DIAGNOSIS — Z0001 Encounter for general adult medical examination with abnormal findings: Secondary | ICD-10-CM | POA: Diagnosis not present

## 2020-05-14 DIAGNOSIS — Z0000 Encounter for general adult medical examination without abnormal findings: Secondary | ICD-10-CM | POA: Insufficient documentation

## 2020-05-14 DIAGNOSIS — Z23 Encounter for immunization: Secondary | ICD-10-CM | POA: Diagnosis not present

## 2020-05-14 DIAGNOSIS — M25461 Effusion, right knee: Secondary | ICD-10-CM | POA: Diagnosis not present

## 2020-05-14 DIAGNOSIS — M25561 Pain in right knee: Secondary | ICD-10-CM | POA: Diagnosis not present

## 2020-05-14 DIAGNOSIS — L720 Epidermal cyst: Secondary | ICD-10-CM | POA: Insufficient documentation

## 2020-05-14 DIAGNOSIS — I2510 Atherosclerotic heart disease of native coronary artery without angina pectoris: Secondary | ICD-10-CM

## 2020-05-14 DIAGNOSIS — M1711 Unilateral primary osteoarthritis, right knee: Secondary | ICD-10-CM | POA: Diagnosis not present

## 2020-05-14 LAB — URINALYSIS, ROUTINE W REFLEX MICROSCOPIC
Bilirubin Urine: NEGATIVE
Hgb urine dipstick: NEGATIVE
Ketones, ur: NEGATIVE
Leukocytes,Ua: NEGATIVE
Nitrite: NEGATIVE
RBC / HPF: NONE SEEN (ref 0–?)
Specific Gravity, Urine: <=1.005 — AB (ref 1.000–1.030)
Total Protein, Urine: NEGATIVE
Urine Glucose: NEGATIVE
Urobilinogen, UA: 0.2 (ref 0.0–1.0)
WBC, UA: NONE SEEN (ref 0–?)
pH: 6 (ref 5.0–8.0)

## 2020-05-14 LAB — COMPREHENSIVE METABOLIC PANEL
ALT: 22 U/L (ref 0–53)
AST: 14 U/L (ref 0–37)
Albumin: 4.4 g/dL (ref 3.5–5.2)
Alkaline Phosphatase: 73 U/L (ref 39–117)
BUN: 15 mg/dL (ref 6–23)
CO2: 28 mEq/L (ref 19–32)
Calcium: 9.8 mg/dL (ref 8.4–10.5)
Chloride: 105 mEq/L (ref 96–112)
Creatinine, Ser: 0.93 mg/dL (ref 0.40–1.50)
GFR: 93.44 mL/min (ref >60.00)
Glucose, Bld: 105 mg/dL — ABNORMAL HIGH (ref 70–99)
Potassium: 4.2 mEq/L (ref 3.5–5.1)
Sodium: 139 mEq/L (ref 135–145)
Total Bilirubin: 0.6 mg/dL (ref 0.2–1.2)
Total Protein: 6.8 g/dL (ref 6.0–8.3)

## 2020-05-14 LAB — CBC
HCT: 46.4 % (ref 39.0–52.0)
Hemoglobin: 15.8 g/dL (ref 13.0–17.0)
MCHC: 34.1 g/dL (ref 30.0–36.0)
MCV: 91.1 fl (ref 78.0–100.0)
Platelets: 238 10*3/uL (ref 150.0–400.0)
RBC: 5.09 Mil/uL (ref 4.22–5.81)
RDW: 12.8 % (ref 11.5–15.5)
WBC: 7.7 10*3/uL (ref 4.0–10.5)

## 2020-05-14 LAB — LIPID PANEL
Cholesterol: 115 mg/dL (ref 0–200)
HDL: 33.5 mg/dL — ABNORMAL LOW (ref >39.00)
LDL Cholesterol: 62 mg/dL (ref 0–99)
NonHDL: 81.94
Total CHOL/HDL Ratio: 3
Triglycerides: 101 mg/dL (ref 0.0–149.0)
VLDL: 20.2 mg/dL (ref 0.0–40.0)

## 2020-05-14 LAB — PSA: PSA: 0.76 ng/mL (ref 0.10–4.00)

## 2020-05-14 NOTE — Patient Instructions (Addendum)
Epidermoid Cyst  An epidermoid cyst, also called an epidermal cyst, is a small lump under your skin. The cyst contains a substance called keratin. Do not try to pop or open the cyst yourself. What are the causes?  A blocked hair follicle.  A hair that curls and re-enters the skin instead of growing straight out of the skin.  A blocked pore.  Irritated skin.  An injury to the skin.  Certain conditions that are passed along from parent to child.  Human papillomavirus (HPV). This happens rarely when cysts occur on the bottom of the feet.  Long-term sun damage to the skin. What increases the risk?  Having acne.  Being male.  Having an injury to the skin.  Being past puberty.  Having certain conditions caused by genes (genetic disorder) What are the signs or symptoms? These cysts are usually harmless, but they can get infected. Symptoms of infection may include:  Redness.  Inflammation.  Tenderness.  Warmth.  Fever.  A bad-smelling substance that drains from the cyst.  Pus that drains from the cyst. How is this treated? In many cases, epidermoid cysts go away on their own without treatment. If a cyst becomes infected, treatment may include:  Opening and draining the cyst, done by a doctor. After draining, you may need minor surgery to remove the rest of the cyst.  Antibiotic medicine.  Shots of medicines (steroids) that help to reduce inflammation.  Surgery to remove the cyst. Surgery may be done if the cyst: ? Becomes large. ? Bothers you. ? Has a chance of turning into cancer.  Do not try to open a cyst yourself. Follow these instructions at home: Medicines  Take over-the-counter and prescription medicines as told by your doctor.  If you were prescribed an antibiotic medicine, take it as told by your doctor. Do not stop taking it even if you start to feel better. General instructions  Keep the area around your cyst clean and dry.  Wear loose, dry  clothing.  Avoid touching your cyst.  Check your cyst every day for signs of infection. Check for: ? Redness, swelling, or pain. ? Fluid or blood. ? Warmth. ? Pus or a bad smell.  Keep all follow-up visits. How is this prevented?  Wear clean, dry, clothing.  Avoid wearing tight clothing.  Keep your skin clean and dry. Take showers or baths every day. Contact a doctor if:  Your cyst has symptoms of infection.  Your condition does not improve or gets worse.  You have a cyst that looks different from other cysts you have had.  You have a fever. Get help right away if:  Redness spreads from the cyst into the area close by. Summary  An epidermoid cyst is a small lump under your skin.  If a cyst becomes infected, treatment may include surgery to open and drain the cyst, or to remove it.  Take over-the-counter and prescription medicines only as told by your doctor.  Contact a doctor if your condition is not improving or is getting worse.  Keep all follow-up visits. This information is not intended to replace advice given to you by your health care provider. Make sure you discuss any questions you have with your health care provider. Document Revised: 05/08/2019 Document Reviewed: 05/08/2019 Elsevier Patient Education  2021 [employer] Inc.  Health Maintenance, Male Adopting a healthy lifestyle and getting preventive care are important in promoting health and wellness. Ask your health care provider about:  The right schedule for  you to have regular tests and exams.  Things you can do on your own to prevent diseases and keep yourself healthy. What should I know about diet, weight, and exercise? Eat a healthy diet  Eat a diet that includes plenty of vegetables, fruits, low-fat dairy products, and lean protein.  Do not eat a lot of foods that are high in solid fats, added sugars, or sodium.   Maintain a healthy weight Body mass index (BMI) is a measurement that can be  used to identify possible weight problems. It estimates body fat based on height and weight. Your health care provider can help determine your BMI and help you achieve or maintain a healthy weight. Get regular exercise Get regular exercise. This is one of the most important things you can do for your health. Most adults should:  Exercise for at least 150 minutes each week. The exercise should increase your heart rate and make you sweat (moderate-intensity exercise).  Do strengthening exercises at least twice a week. This is in addition to the moderate-intensity exercise.  Spend less time sitting. Even light physical activity can be beneficial. Watch cholesterol and blood lipids Have your blood tested for lipids and cholesterol at 54 years of age, then have this test every 5 years. You may need to have your cholesterol levels checked more often if:  Your lipid or cholesterol levels are high.  You are older than 54 years of age.  You are at high risk for heart disease. What should I know about cancer screening? Many types of cancers can be detected early and may often be prevented. Depending on your health history and family history, you may need to have cancer screening at various ages. This may include screening for:  Colorectal cancer.  Prostate cancer.  Skin cancer.  Lung cancer. What should I know about heart disease, diabetes, and high blood pressure? Blood pressure and heart disease  High blood pressure causes heart disease and increases the risk of stroke. This is more likely to develop in people who have high blood pressure readings, are of African descent, or are overweight.  Talk with your health care provider about your target blood pressure readings.  Have your blood pressure checked: ? Every 3-5 years if you are 3418-54 years of age. ? Every year if you are 54 years old or older.  If you are between the ages of 3865 and 3075 and are a current or former smoker, ask your  health care provider if you should have a one-time screening for abdominal aortic aneurysm (AAA). Diabetes Have regular diabetes screenings. This checks your fasting blood sugar level. Have the screening done:  Once every three years after age 54 if you are at a normal weight and have a low risk for diabetes.  More often and at a younger age if you are overweight or have a high risk for diabetes. What should I know about preventing infection? Hepatitis B If you have a higher risk for hepatitis B, you should be screened for this virus. Talk with your health care provider to find out if you are at risk for hepatitis B infection. Hepatitis C Blood testing is recommended for:  Everyone born from [id number] through 1964.  Anyone with known risk factors for hepatitis C. Sexually transmitted infections (STIs)  You should be screened each year for STIs, including gonorrhea and chlamydia, if: ? You are sexually active and are younger than 54 years of age. ? You are older than 54 years  of age and your health care provider tells you that you are at risk for this type of infection. ? Your sexual activity has changed since you were last screened, and you are at increased risk for chlamydia or gonorrhea. Ask your health care provider if you are at risk.  Ask your health care provider about whether you are at high risk for HIV. Your health care provider may recommend a prescription medicine to help prevent HIV infection. If you choose to take medicine to prevent HIV, you should first get tested for HIV. You should then be tested every 3 months for as long as you are taking the medicine. Follow these instructions at home: Lifestyle  Do not use any products that contain nicotine or tobacco, such as cigarettes, e-cigarettes, and chewing tobacco. If you need help quitting, ask your health care provider.  Do not use street drugs.  Do not share needles.  Ask your health care provider for help if you need  support or information about quitting drugs. Alcohol use  Do not drink alcohol if your health care provider tells you not to drink.  If you drink alcohol: ? Limit how much you have to 0-2 drinks a day. ? Be aware of how much alcohol is in your drink. In the [place] one drink equals one 12 oz bottle of beer (355 mL), one 5 oz glass of wine (148 mL), or one 1 oz glass of hard liquor (44 mL). General instructions  Schedule regular health, dental, and eye exams.  Stay current with your vaccines.  Tell your health care provider if: ? You often feel depressed. ? You have ever been abused or do not feel safe at home. Summary  Adopting a healthy lifestyle and getting preventive care are important in promoting health and wellness.  Follow your health care provider's instructions about healthy diet, exercising, and getting tested or screened for diseases.  Follow your health care provider's instructions on monitoring your cholesterol and blood pressure. This information is not intended to replace advice given to you by your health care provider. Make sure you discuss any questions you have with your health care provider. Document Revised: 01/24/2018 Document Reviewed: 01/24/2018 Elsevier Patient Education  2021 [employer] Inc.  Preventive Care 47-47 Years Old, Male Preventive care refers to lifestyle choices and visits with your health care provider that can promote health and wellness. This includes:  A yearly physical exam. This is also called an annual wellness visit.  Regular dental and eye exams.  Immunizations.  Screening for certain conditions.  Healthy lifestyle choices, such as: ? Eating a healthy diet. ? Getting regular exercise. ? Not using drugs or products that contain nicotine and tobacco. ? Limiting alcohol use. What can I expect for my preventive care visit? Physical exam Your health care provider will check your:  Height and weight. These may be used to calculate  your BMI (body mass index). BMI is a measurement that tells if you are at a healthy weight.  Heart rate and blood pressure.  Body temperature.  Skin for abnormal spots. Counseling Your health care provider may ask you questions about your:  Past medical problems.  Family's medical history.  Alcohol, tobacco, and drug use.  Emotional well-being.  Home life and relationship well-being.  Sexual activity.  Diet, exercise, and sleep habits.  Work and work Astronomer.  Access to firearms. What immunizations do I need? Vaccines are usually given at various ages, according to a schedule. Your health care provider  will recommend vaccines for you based on your age, medical history, and lifestyle or other factors, such as travel or where you work.   What tests do I need? Blood tests  Lipid and cholesterol levels. These may be checked every 5 years, or more often if you are over 42 years old.  Hepatitis C test.  Hepatitis B test. Screening  Lung cancer screening. You may have this screening every year starting at age 9 if you have a 30-pack-year history of smoking and currently smoke or have quit within the past 15 years.  Prostate cancer screening. Recommendations will vary depending on your family history and other risks.  Genital exam to check for testicular cancer or hernias.  Colorectal cancer screening. ? All adults should have this screening starting at age 25 and continuing until age 72. ? Your health care provider may recommend screening at age 32 if you are at increased risk. ? You will have tests every 1-10 years, depending on your results and the type of screening test.  Diabetes screening. ? This is done by checking your blood sugar (glucose) after you have not eaten for a while (fasting). ? You may have this done every 1-3 years.  STD (sexually transmitted disease) testing, if you are at risk. Follow these instructions at home: Eating and drinking  Eat a  diet that includes fresh fruits and vegetables, whole grains, lean protein, and low-fat dairy products.  Take vitamin and mineral supplements as recommended by your health care provider.  Do not drink alcohol if your health care provider tells you not to drink.  If you drink alcohol: ? Limit how much you have to 0-2 drinks a day. ? Be aware of how much alcohol is in your drink. In the [place] one drink equals one 12 oz bottle of beer (355 mL), one 5 oz glass of wine (148 mL), or one 1 oz glass of hard liquor (44 mL).   Lifestyle  Take daily care of your teeth and gums. Brush your teeth every morning and night with fluoride toothpaste. Floss one time each day.  Stay active. Exercise for at least 30 minutes 5 or more days each week.  Do not use any products that contain nicotine or tobacco, such as cigarettes, e-cigarettes, and chewing tobacco. If you need help quitting, ask your health care provider.  Do not use drugs.  If you are sexually active, practice safe sex. Use a condom or other form of protection to prevent STIs (sexually transmitted infections).  If told by your health care provider, take low-dose aspirin daily starting at age 31.  Find healthy ways to cope with stress, such as: ? Meditation, yoga, or listening to music. ? Journaling. ? Talking to a trusted person. ? Spending time with friends and family. Safety  Always wear your seat belt while driving or riding in a vehicle.  Do not drive: ? If you have been drinking alcohol. Do not ride with someone who has been drinking. ? When you are tired or distracted. ? While texting.  Wear a helmet and other protective equipment during sports activities.  If you have firearms in your house, make sure you follow all gun safety procedures. What's next?  Go to your health care provider once a year for an annual wellness visit.  Ask your health care provider how often you should have your eyes and teeth checked.  Stay up  to date on all vaccines. This information is not intended to replace advice  given to you by your health care provider. Make sure you discuss any questions you have with your health care provider. Document Revised: 10/30/2018 Document Reviewed: 01/25/2018 Elsevier Patient Education  2021 Else[employer] Inc. Pneumococcal Polysaccharide Vaccine (PPSV23): What You Need to Know 1. Why get vaccinated? Pneumococcal polysaccharide vaccine (PPSV23) can prevent pneumococcal disease. Pneumococcal disease refers to any illness caused by pneumococcal bacteria. These bacteria can cause many types of illnesses, including pneumonia, which is an infection of the lungs. Pneumococcal bacteria are one of the most common causes of pneumonia. Besides pneumonia, pneumococcal bacteria can also cause:  Ear infections  Sinus infections  Meningitis (infection of the tissue covering the brain and spinal cord)  Bacteremia (bloodstream infection) Anyone can get pneumococcal disease, but children under 68 years of age, people with certain medical conditions, adults 65 years or older, and cigarette smokers are at the highest risk. Most pneumococcal infections are mild. However, some can result in long-term problems, such as brain damage or hearing loss. Meningitis, bacteremia, and pneumonia caused by pneumococcal disease can be fatal. 2. PPSV23 PPSV23 protects against 23 types of bacteria that cause pneumococcal disease. PPSV23 is recommended for:  All adults 65 years or older,  Anyone 2 years or older with certain medical conditions that can lead to an increased risk for pneumococcal disease. Most people need only one dose of PPSV23. A second dose of PPSV23, and another type of pneumococcal vaccine called PCV13, are recommended for certain high-risk groups. Your health care provider can give you more information. People 65 years or older should get a dose of PPSV23 even if they have already gotten one or more doses of the  vaccine before they turned 72. 3. Talk with your health care provider Tell your vaccine provider if the person getting the vaccine:  Has had an allergic reaction after a previous dose of PPSV23, or has any severe, life-threatening allergies. In some cases, your health care provider may decide to postpone PPSV23 vaccination to a future visit. People with minor illnesses, such as a cold, may be vaccinated. People who are moderately or severely ill should usually wait until they recover before getting PPSV23. Your health care provider can give you more information. 4. Risks of a vaccine reaction  Redness or pain where the shot is given, feeling tired, fever, or muscle aches can happen after PPSV23. People sometimes faint after medical procedures, including vaccination. Tell your provider if you feel dizzy or have vision changes or ringing in the ears. As with any medicine, there is a very remote chance of a vaccine causing a severe allergic reaction, other serious injury, or death. 5. What if there is a serious problem? An allergic reaction could occur after the vaccinated person leaves the clinic. If you see signs of a severe allergic reaction (hives, swelling of the face and throat, difficulty breathing, a fast heartbeat, dizziness, or weakness), call 9-1-1 and get the person to the nearest hospital. For other signs that concern you, call your health care provider. Adverse reactions should be reported to the Vaccine Adverse Event Reporting System (VAERS). Your health care provider will usually file this report, or you can do it yourself. Visit the VAERS website at www.vaers.LAgents.no or call 269-861-1525. VAERS is only for reporting reactions, and VAERS staff do not give medical advice. 6. How can I learn more?  Ask your health care provider.  Call your local or state health department.  Contact the Centers for Disease Control and Prevention (CDC): ? Call (272) 718-6420 (  1-800-CDC-INFO)  or ? Visit CDC's website at [employer] Vaccine Information Statement PPSV23 Vaccine (12/13/2017) This information is not intended to replace advice given to you by your health care provider. Make sure you discuss any questions you have with your health care provider. Document Revised: 10/04/2019 Document Reviewed: 10/04/2019 Elsevier Patient Education  2021 Elsevier Inc.  Sleep Apnea Sleep apnea affects breathing during sleep. It causes breathing to stop for a short time or to become shallow. It can also increase the risk of:  Heart attack.  Stroke.  Being very overweight (obese).  Diabetes.  Heart failure.  Irregular heartbeat. The goal of treatment is to help you breathe normally again. What are the causes? There are three kinds of sleep apnea:  Obstructive sleep apnea. This is caused by a blocked or collapsed airway.  Central sleep apnea. This happens when the brain does not send the right signals to the muscles that control breathing.  Mixed sleep apnea. This is a combination of obstructive and central sleep apnea. The most common cause of this condition is a collapsed or blocked airway. This can happen if:  Your throat muscles are too relaxed.  Your tongue and tonsils are too large.  You are overweight.  Your airway is too small.   What increases the risk?  Being overweight.  Smoking.  Having a small airway.  Being older.  Being male.  Drinking alcohol.  Taking medicines to calm yourself (sedatives or tranquilizers).  Having family members with the condition. What are the signs or symptoms?  Trouble staying asleep.  Being sleepy or tired during the day.  Getting angry a lot.  Loud snoring.  Headaches in the morning.  Not being able to focus your mind (concentrate).  Forgetting things.  Less interest in sex.  Mood swings.  Personality changes.  Feelings of sadness (depression).  Waking up a lot during the night to pee  (urinate).  Dry mouth.  Sore throat. How is this diagnosed?  Your medical history.  A physical exam.  A test that is done when you are sleeping (sleep study). The test is most often done in a sleep lab but may also be done at home. How is this treated?  Sleeping on your side.  Using a medicine to get rid of mucus in your nose (decongestant).  Avoiding the use of alcohol, medicines to help you relax, or certain pain medicines (narcotics).  Losing weight, if needed.  Changing your diet.  Not smoking.  Using a machine to open your airway while you sleep, such as: ? An oral appliance. This is a mouthpiece that shifts your lower jaw forward. ? A CPAP device. This device blows air through a mask when you breathe out (exhale). ? An EPAP device. This has valves that you put in each nostril. ? A BPAP device. This device blows air through a mask when you breathe in (inhale) and breathe out.  Having surgery if other treatments do not work. It is important to get treatment for sleep apnea. Without treatment, it can lead to:  High blood pressure.  Coronary artery disease.  In men, not being able to have an erection (impotence).  Reduced thinking ability.   Follow these instructions at home: Lifestyle  Make changes that your doctor recommends.  Eat a healthy diet.  Lose weight if needed.  Avoid alcohol, medicines to help you relax, and some pain medicines.  Do not use any products that contain nicotine or tobacco, such as cigarettes, e-cigarettes, and  chewing tobacco. If you need help quitting, ask your doctor. General instructions  Take over-the-counter and prescription medicines only as told by your doctor.  If you were given a machine to use while you sleep, use it only as told by your doctor.  If you are having surgery, make sure to tell your doctor you have sleep apnea. You may need to bring your device with you.  Keep all follow-up visits as told by your doctor.  This is important. Contact a doctor if:  The machine that you were given to use during sleep bothers you or does not seem to be working.  You do not get better.  You get worse. Get help right away if:  Your chest hurts.  You have trouble breathing in enough air.  You have an uncomfortable feeling in your back, arms, or stomach.  You have trouble talking.  One side of your body feels weak.  A part of your face is hanging down. These symptoms may be an emergency. Do not wait to see if the symptoms will go away. Get medical help right away. Call your local emergency services (911 in the [place] Do not drive yourself to the hospital. Summary  This condition affects breathing during sleep.  The most common cause is a collapsed or blocked airway.  The goal of treatment is to help you breathe normally while you sleep. This information is not intended to replace advice given to you by your health care provider. Make sure you discuss any questions you have with your health care provider. Document Revised: 11/17/2017 Document Reviewed: 09/26/2017 Elsevier Patient Education  2021 [employer].

## 2020-05-14 NOTE — Progress Notes (Signed)
New Patient Office Visit  Subjective:  Patient ID: [name] [name], male    DOB: 08-19-1965  Age: 55 y.o. MRN: [id number]  CC:  Chief Complaint  Patient presents with  . Establish Care    Pt would like to discuss Spot on right cheek below eye, knee pain (right).  Getting a Colonoscopy, and sleeping issues     HPI [name] [name] presents for for physical exam.  Patient reports ongoing issues with sleep.  He is no worse.  Wife does go into the other room.  He has issues with right knee pain.  Denies prior injury to the knee but did used to race motorcycles.  Status post mild case of Covid after completing the 2 shot series.  He has not had a booster.  There is a lesion on his face that his wife and daughter are concerned about.  It never becomes inflamed or has ever drained.  Status post mild MI back in April of last year.  He has drug-eluting stents and is on Brilinta.  He is exercising regularly.  Review of echocardiogram shows excellent ejection fraction of 60% with no left wall motion abnormalities.  Occasionally experiences shortness of breath but never with regular exercise.  Cardiology feels though that it is secondary to the Brilinta.  Strong family history of colon cancer.  He is status post multiple colonoscopies.  Last 1 was held secondary to his recent MI.  Past Medical History:  Diagnosis Date  . History of acute anterior wall MI 05/29/2019   100% LAD after SP1 -> (long lesion); 90% prox OM 2 (mod LPL 60%), ~100% CTO of RPDA (high bifurcation, fills via L-R collateral flow) - 3 overlapping DES in LAD & 1 in OM2  . Hyperlipidemia LDL goal <70   . 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     Past Surgical History:  Procedure Laterality Date  . CORONARY STENT INTERVENTION N/A 05/29/2019   Procedure: CORONARY STENT INTERVENTION;  Surgeon: Marykay Lex, MD;  Location: [facility] INVASIVE CV LAB;  Service: Cardiovascular;; 2nd LESION: OM 2 with Resolute Onyx DES 2.0 mm x 12 mm--postdilated to 2.2 mm)  . CORONARY/GRAFT ACUTE MI REVASCULARIZATION N/A 05/29/2019   Procedure: CORONARY/GRAFT ACUTE MI REVASCULARIZATION;  Surgeon: Marykay Lex, MD;  Location: [facility] INVASIVE CV LAB;  Service: Cardiovascular;  Successful DES PCI of extensive proximal to mid LAD using 3 overlapping Resolute Onyx DES stents (3.0 mm x 12 mm, 2.5 mm x 38 mm, and 2.25 mm x 15 mm -> entire segment postdilated from 3.65 mm down to 2.8 mm)   . LEFT HEART CATH AND CORONARY ANGIOGRAPHY N/A 05/29/2019   Procedure: LEFT HEART CATH AND CORONARY ANGIOGRAPHY;  Surgeon: Marykay Lex, MD;  Location: [facility] INVASIVE CV LAB;  Service: Cardiovascular;; CULPRIT LESION 100% LAD after SP1 (long lesion); 90% proximal OM 2 (with moderate LPL), 100% CTO of RPDA (high bifurcation, fills via L-R collaterals; EF ~35-40% mid-apical Hypo-AK. LVEDP 22 mmHg.  Marland Kitchen TRANSTHORACIC ECHOCARDIOGRAM  09/2019   EF ~ 55-60%, Mid-apical Inferoseptal HK & AK of apical and anteroseptal wall.  Gr II DD.  Normal RV size and function.  Mild aortic sclerosis.  . TRANSTHORACIC ECHOCARDIOGRAM  05/30/2019    (Anterior STEMI) --> EF 35 to 40%.  Entire anterior wall, mid and distal anterior septum and apex are akinetic.  Mild LVH, GR 1 DD.    Family History  Problem Relation Age of Onset  . Hypertension Mother     Social History   Socioeconomic History  . Marital status: Married    Spouse name: Not on file  . Number of children: Not on file  . Years of education: 65  . Highest education level: High school graduate  Occupational History  . Occupation: Editor, commissioning: [employer]  Tobacco Use  . Smoking status: Never Smoker  . Smokeless tobacco: Never Used  Substance and Sexual Activity  . Alcohol use: Never  . Drug use: Never  . Sexual activity: Not on file  Other Topics Concern  . Not on file  Social History Narrative   Lives with his wife in [place].      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   Social Determinants of Health   Financial Resource Strain: Not on file  Food Insecurity: Not on file  Transportation Needs: Not on file  Physical Activity: Not on file  Stress: Not on file  Social Connections: Not on file  Intimate Partner Violence: Not on file    ROS Review of Systems  Constitutional: Negative.   HENT: Negative.   Eyes: Negative for photophobia and visual disturbance.  Respiratory: Positive for apnea. Negative for chest tightness and wheezing.   Cardiovascular: Negative for chest pain and leg swelling.  Gastrointestinal: Negative.   Endocrine: Negative for polyphagia and polyuria.  Genitourinary: Negative for difficulty urinating, frequency and urgency.  Musculoskeletal: Positive for arthralgias. Negative for gait problem.  Skin: Negative for pallor and rash.  Neurological: Negative for speech difficulty and weakness.  Hematological: Does not bruise/bleed easily.  Psychiatric/Behavioral: Negative.     Objective:   Today's Vitals: BP 101/80 (BP Location: Left Arm, Patient Position: Sitting, Cuff Size: Normal)   Pulse 63   Temp (!) 97.3 F (36.3 C) (Temporal)   Ht 5\' 7"  (1.702 m)   Wt 182 lb 3.2 oz (82.6 kg)   SpO2 96%   BMI 28.54 kg/m   Physical Exam Vitals and nursing note reviewed.  Constitutional:      General: He is not in acute distress.    Appearance: Normal appearance. He is not ill-appearing or toxic-appearing.  HENT:     Head: Normocephalic and atraumatic.     Right Ear: Tympanic membrane, ear canal and external ear normal.     Left Ear: Tympanic membrane, ear  canal and external ear normal.     Mouth/Throat:     Mouth: Mucous membranes are moist.     Pharynx: Oropharynx is clear. No oropharyngeal exudate or posterior oropharyngeal erythema.   Eyes:     Extraocular Movements: Extraocular movements intact.     Conjunctiva/sclera: Conjunctivae normal.     Pupils: Pupils are equal, round, and reactive to light.  Cardiovascular:     Rate and Rhythm: Normal rate and regular rhythm.  Pulmonary:     Effort: Pulmonary effort is normal.     Breath sounds: Normal breath sounds.  Abdominal:     General: Abdomen is flat. Bowel sounds are normal. There is no distension.  Palpations: Abdomen is soft. There is no mass.     Tenderness: There is no abdominal tenderness. There is no guarding or rebound.     Hernia: No hernia is present.  Genitourinary:    Prostate: Enlarged. Not tender and no nodules present.     Rectum: Guaiac result negative. No mass, tenderness, anal fissure, external hemorrhoid or internal hemorrhoid. Normal anal tone.  Musculoskeletal:     Cervical back: No rigidity or tenderness.     Right knee: No swelling, effusion, erythema, ecchymosis or bony tenderness. Normal range of motion. No tenderness. No ACL laxity or PCL laxity. Normal patellar mobility.     Right lower leg: No edema.     Left lower leg: No edema.       Legs:  Lymphadenopathy:     Cervical: No cervical adenopathy.  Skin:    General: Skin is warm and dry.     Findings: Lesion present.       Neurological:     Mental Status: He is alert and oriented to person, place, and time.  Psychiatric:        Mood and Affect: Mood normal.     Assessment & Plan:   Problem List Items Addressed This Visit      Cardiovascular and Mediastinum   Coronary artery disease involving native coronary artery of native heart without angina pectoris - Primary   Relevant Orders   CBC   Comprehensive metabolic panel   Lipid panel   Urinalysis, Routine w reflex microscopic      Other   Snores   Relevant Orders   Ambulatory referral to Pulmonology   Inclusion cyst   Relevant Orders   Ambulatory referral to Dermatology   Right knee pain   Relevant Orders   DG Knee Complete 4 Views Right   Healthcare maintenance   Relevant Orders   PSA      Outpatient Encounter Medications as of 05/14/2020  Medication Sig  . aspirin 81 MG chewable tablet Chew 1 tablet (81 mg total) by mouth daily.  Marland Kitchen atorvastatin (LIPITOR) 80 MG tablet Take 80 mg  Tablet every other day  and alternate the other day with 40 mg ( 1/2 tablet)  . carvedilol (COREG) 6.25 MG tablet Take 1 tablet (6.25 mg total) by mouth 2 (two) times daily with a meal.  . losartan (COZAAR) 25 MG tablet Take 0.5 tablets (12.5 mg total) by mouth daily.  . nitroGLYCERIN (NITROSTAT) 0.4 MG SL tablet Place 1 tablet (0.4 mg total) under the tongue every 5 (five) minutes as needed for chest pain (up to 3 doses. If taking 3rd dose, call 911.).  Marland Kitchen ticagrelor (BRILINTA) 90 MG TABS tablet Take 1 tablet (90 mg total) by mouth 2 (two) times daily.   No facility-administered encounter medications on file as of 05/14/2020.    Follow-up: Return in about 6 months (around 11/13/2020).  Patient will follow up with GI after clearance from cardiology for holding Brilinta for his next colonoscopy.  He was given information on apnea and agrees to go for consultation for possible sleep study.  Agrees to have the pneumococcal 23 and was advised to go ahead and get his Covid booster.  Also given information on health maintenance and disease prevention.  He will continue exercising.  Given information on pneumococcal vaccine, health maintenance and disease prevention.  Also given information on epidermoid cyst and sleep apnea.  Mliss Sax, MD

## 2020-05-14 NOTE — Addendum Note (Signed)
Addended by: Renaldo Reel S on: 05/14/2020 11:43 AM   Modules accepted: Orders

## 2020-05-25 ENCOUNTER — Encounter: Payer: Self-pay | Admitting: Cardiology

## 2020-05-25 ENCOUNTER — Ambulatory Visit (INDEPENDENT_AMBULATORY_CARE_PROVIDER_SITE_OTHER): Payer: BC Managed Care – PPO | Admitting: Cardiology

## 2020-05-25 ENCOUNTER — Other Ambulatory Visit: Payer: Self-pay

## 2020-05-25 VITALS — BP 104/70 | HR 58 | Ht 67.0 in | Wt 182.0 lb

## 2020-05-25 DIAGNOSIS — I10 Essential (primary) hypertension: Secondary | ICD-10-CM

## 2020-05-25 DIAGNOSIS — E785 Hyperlipidemia, unspecified: Secondary | ICD-10-CM | POA: Diagnosis not present

## 2020-05-25 DIAGNOSIS — I255 Ischemic cardiomyopathy: Secondary | ICD-10-CM

## 2020-05-25 DIAGNOSIS — I2102 ST elevation (STEMI) myocardial infarction involving left anterior descending coronary artery: Secondary | ICD-10-CM | POA: Diagnosis not present

## 2020-05-25 DIAGNOSIS — I2510 Atherosclerotic heart disease of native coronary artery without angina pectoris: Secondary | ICD-10-CM

## 2020-05-25 DIAGNOSIS — R29898 Other symptoms and signs involving the musculoskeletal system: Secondary | ICD-10-CM

## 2020-05-25 MED ORDER — CARVEDILOL 6.25 MG PO TABS: ORAL_TABLET | ORAL | 3 refills | Status: DC

## 2020-05-25 MED ORDER — TICAGRELOR 60 MG PO TABS: 60.0000 mg | ORAL_TABLET | Freq: Two times a day (BID) | ORAL | 3 refills | Status: DC

## 2020-05-25 MED ORDER — ATORVASTATIN CALCIUM 40 MG PO TABS
40.0000 mg | ORAL_TABLET | Freq: Every day | ORAL | 3 refills | Status: DC
Start: 2020-05-25 — End: 2020-09-29

## 2020-05-25 NOTE — Patient Instructions (Addendum)
Medication Instructions:  Stop aspirin  Complete 90mg  of Brilinta bottle, then stop, then begin 60mg  Brilinta twice a day.  Take a half tablet of carvedilol (3.125mg ) in the morning, and a whole tablet of carvedilol (6.25mg ) in the evening.   Start a statin holiday (no atorvastatin) for 1 month. If no changes with leg fatigue, restart atorvastatin at 40mg /day. If leg fatigue improves, contact the office.   *If you need a refill on your cardiac medications before your next appointment, please call your pharmacy*   Lab Work: Labs in 4-5 months. CMP, Lipid panel.    Testing/Procedures: To be scheduled at [address] [facility] [address]. Your physician has requested that you have an ankle brachial index (ABI). During this test an ultrasound and blood pressure cuff are used to evaluate the arteries that supply the arms and legs with blood. Allow thirty minutes for this exam. There are no restrictions or special instructions.     Follow-Up: At [facility], you and your health needs are our priority.  As part of our continuing mission to provide you with exceptional heart care, we have created designated Provider Care Teams.  These Care Teams include your primary Cardiologist (physician) and Advanced Practice Providers (APPs -  Physician Assistants and Nurse Practitioners) who all work together to provide you with the care you need, when you need it.  We recommend signing up for the patient portal called "MyChart".  Sign up information is provided on this After Visit Summary.  MyChart is used to connect with patients for Virtual Visits (Telemedicine).  Patients are able to view lab/test results, encounter notes, upcoming appointments, etc.  Non-urgent messages can be sent to your provider as well.   To learn more about what you can do with MyChart, go to .    Your next appointment:   6 month(s)  The format for your next appointment:   In Person  Provider:   [facility], MD

## 2020-05-25 NOTE — Progress Notes (Signed)
Primary Care Provider: Kremer, [name] Saxm Alfred, MD Cardiologist: [name] Lemmaavid Donalee Gaumond, MD Electrophysiologist: None  Clinic Note: Chief Complaint  Patient presents with  . Follow-up    4 months.  . Coronary Artery Disease    No angina.  . Leg Pain    Leg fatigue at rest and exertion associated with mild calf pain.    ===================================  ASSESSMENT/PLAN   Problem List Items Addressed This Visit    Leg fatigue    Need to exclude statin related myalgias but also need to evaluate for PVD.  Plan:  Check ABIs along with LEA dopplers  Statin holiday & adjust pending on Sx.       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  Also has known occlusion of the RCA with left-to-right collaterals and 80% OM lesion being treated medically.  No recurrent angina or significant CHF symptoms.  I think some of his dyspnea is related to Brilinta. EF improved almost back to baseline normal EF.    Plan:  1 year PCI, okay to reduce to maintenance dose Brilinta 60 mg twice daily and stop aspirin.      Relevant Medications   carvedilol (COREG) 6.25 MG tablet   atorvastatin (LIPITOR) 40 MG tablet   Other Relevant Orders   Comprehensive Metabolic Panel (CMET)   Coronary artery disease involving native coronary artery of native heart without angina pectoris - Primary (Chronic)    Extensive LAD PCI.  Anticipate him being on lifelong Thienopyridine coverage.  No recurrent angina symptoms and the number of improved EF after anterior STEMI.  Now 1 year out.  Plan:  Continue carvedilol losartan).  1 month statin holiday for leg fatigue, adjust accordingly, may need to switch to rosuvastatin.  On DAPT currently.  Okay to DC aspirin and reduce Brilinta to 60 mg twice daily.  Hopefully this will help some  of the dyspnea.  Okay to hold Brilinta 5 days preop for surgical procedures.      Relevant Medications   carvedilol (COREG) 6.25 MG tablet   atorvastatin (LIPITOR) 40 MG tablet   Other Relevant Orders   Lipid panel   Comprehensive Metabolic Panel (CMET)   Ischemic cardiomyopathy (Chronic)    Resolved with most recent EF of 55 to 60%.  Is on low-dose losartan and moderate dose carvedilol.      Relevant Medications   carvedilol (COREG) 6.25 MG tablet   atorvastatin (LIPITOR) 40 MG tablet   Hyperlipidemia LDL goal <70 (Chronic)    LDL looks great at 62.  He is currently on alternating 80 mg and 40 mg tablets of atorvastatin. He is having some leg fatigue.  Concerned that this could be myalgias from statin.  Plan: 1 month statin holiday.  If 40 mg daily.  If leg fatigue improves, will likely switch to rosuvastatin 20 mg daily -- 4-5 month lab recheck.       Relevant Medications   carvedilol (COREG) 6.25 MG tablet   atorvastatin (LIPITOR) 40 MG tablet   Other Relevant Orders   Lipid panel   Comprehensive Metabolic Panel (CMET)   Essential hypertension (Chronic)    Pretty well controlled blood pressure on current dose of carvedilol and losartan.      Relevant Medications   carvedilol (COREG) 6.25 MG tablet   atorvastatin (LIPITOR)  40 MG tablet     ===================================  HPI:    [name] [name] is a 55 y.o. male with a PMH notable for MV CAD (Anterior STEMI 05/2019 -> 3 overlapping DES p-mLAD, 1 DES OM2, CTO of  rPDA w/ L-R collaterals - med Rx) & recovered ICM (EF up to 55-60% from 35-40%) who presents today for 4 month f/u.  [name] [name] was last seen on 01/23/2020 as a second 71-month follow-up.  He was doing fairly well.  He had a relatively mild case of COVID-19 in September 2021 he only realized that because his family members tested positive.  He noticed some bruising and occasional spells of dyspnea, but otherwise no exertional chest pain  pressure or dyspnea.  Some nighttime cramping.  Change atorvastatin dosing to 40 mg tabs every other day alternating with 80 mg.  CV Review of Symptoms (Summary): no chest pain or dyspnea on exertion positive for - Episodes of shortness of breath, bruising, cramping negative for - edema, irregular heartbeat, orthopnea, palpitations, paroxysmal nocturnal dyspnea, rapid heart rate or Rapid heart rates, syncope/near syncope, TIA/amaurosis fugax or claudication.  Recent Hospitalizations: n/a  Reviewed  CV studies:    The following studies were reviewed today: (if available, images/films reviewed: From Epic Chart or Care Everywhere) . n/a:   Interval History:   [name] [name] "[name]" returns for his third 83-month follow-up now roughly 1 year out from his MI.  He is doing quite well.  He symptoms get short of breath at night and has to take a deep breath in.  Not associate with exertion.  Not associate with any chest tightness or pressure.  He has not had any exertional chest tightness/pressure or dyspnea. He says he gets occasional bilateral calf pain associated with off-and-on right knee pain.  Assess his legs hurt at rest and walking.  Not sure if it is because of his knees or potential vascular issue.  He has mild intermittent fatigue and is pending sleep study evaluation.   CV Review of Symptoms (Summary): no chest pain or dyspnea on exertion positive for - shortness of breath and short spells of SOB, leg fatuge negative for - edema, irregular heartbeat, orthopnea, palpitations, paroxysmal nocturnal dyspnea, rapid heart rate or syncope/near syncope; TIA/amaurosis fugax    The patient does not Have symptoms concerning for COVID-19 infection (fever, chills, cough, or new shortness of breath).   REVIEWED OF SYSTEMS   Review of Systems  Constitutional: Negative for malaise/fatigue and weight loss.  HENT: Positive for tinnitus. Negative for congestion and nosebleeds.   Respiratory:  Positive for shortness of breath (Short spells of trying to catch his breath).   Cardiovascular:       HPI  Gastrointestinal: Negative for abdominal pain, blood in stool and melena.  Genitourinary: Negative for hematuria.  Musculoskeletal: Positive for joint pain (r knee). Negative for falls.       Leg fatigue/calf pain  Neurological: Negative for dizziness, focal weakness and headaches.  Psychiatric/Behavioral: Negative for depression and memory loss. The patient is not nervous/anxious and does not have insomnia.    I have reviewed and (if needed) personally updated the patient's problem list, medications, allergies, past medical and surgical history, social and family history.   PAST MEDICAL HISTORY   Past Medical History:  Diagnosis Date  . History of acute anterior wall MI 05/29/2019   100% LAD after SP1 -> (long lesion); 90% prox OM 2 (mod LPL 60%), ~100% CTO of RPDA (high bifurcation, fills  via L-R collateral flow) - 3 overlapping DES in LAD & 1 in OM2  . Hyperlipidemia LDL goal <70   . Multiple vessel coronary artery disease 05/29/2019   Cath 05/29/2019 : Anterior STEMI -> Severe Three-Vessel Disease:  CULPRIT LESION 100% LAD after SP1 (long); 90% pOM 2 (with mod LPL), ~100 CTO of RPDA (high bifurcation, L-R collateral); Successful DES PCI p-mLAD w/ 3 overlapping Resolute Onyx DES (3.0 x 12, 2.5 x 38, & 2.25 x 15 --> 3.65-2.8 mm), OM2 Resolute Onyx DES 2 x 12 - 2.2 mm.     PAST SURGICAL HISTORY   Past Surgical History:  Procedure Laterality Date  . CORONARY STENT INTERVENTION N/A 05/29/2019   Procedure: CORONARY STENT INTERVENTION;  Surgeon: Marykay Lex, MD;  Location: [facility] INVASIVE CV LAB;  Service: Cardiovascular;; 2nd LESION: OM 2 with Resolute Onyx DES 2.0 mm x 12 mm--postdilated to 2.2 mm)  . CORONARY/GRAFT ACUTE MI REVASCULARIZATION N/A 05/29/2019   Procedure: CORONARY/GRAFT ACUTE MI REVASCULARIZATION;  Surgeon: Marykay Lex, MD;  Location: [facility] INVASIVE CV LAB;  Service:  Cardiovascular;  Successful DES PCI of extensive proximal to mid LAD using 3 overlapping Resolute Onyx DES stents (3.0 mm x 12 mm, 2.5 mm x 38 mm, and 2.25 mm x 15 mm -> entire segment postdilated from 3.65 mm down to 2.8 mm)   . LEFT HEART CATH AND CORONARY ANGIOGRAPHY N/A 05/29/2019   Procedure: LEFT HEART CATH AND CORONARY ANGIOGRAPHY;  Surgeon: Marykay Lex, MD;  Location: [facility] INVASIVE CV LAB;  Service: Cardiovascular;; CULPRIT LESION 100% LAD after SP1 (long lesion); 90% proximal OM 2 (with moderate LPL), 100% CTO of RPDA (high bifurcation, fills via L-R collaterals; EF ~35-40% mid-apical Hypo-AK. LVEDP 22 mmHg.  Marland Kitchen TRANSTHORACIC ECHOCARDIOGRAM  09/2019   EF ~ 55-60%, Mid-apical Inferoseptal HK & AK of apical and anteroseptal wall.  Gr II DD.  Normal RV size and function.  Mild aortic sclerosis.  . TRANSTHORACIC ECHOCARDIOGRAM  05/30/2019    (Anterior STEMI) --> EF 35 to 40%.  Entire anterior wall, mid and distal anterior septum and apex are akinetic.  Mild LVH, GR 1 DD.   05/29/2019   PCI LAD    Immunization History  Administered Date(s) Administered  . Moderna Sars-Covid-2 Vaccination 06/26/2019, 07/07/2019  . Pneumococcal Polysaccharide-23 05/14/2020    MEDICATIONS/ALLERGIES   Current Meds  Medication Sig  . atorvastatin (LIPITOR) 40 MG tablet Take 1 tablet (40 mg total) by mouth daily.  Marland Kitchen losartan (COZAAR) 25 MG tablet Take 0.5 tablets (12.5 mg total) by mouth daily.  . nitroGLYCERIN (NITROSTAT) 0.4 MG SL tablet Place 1 tablet (0.4 mg total) under the tongue every 5 (five) minutes as needed for chest pain (up to 3 doses. If taking 3rd dose, call 911.).  Marland Kitchen ticagrelor (BRILINTA) 60 MG TABS tablet Take 1 tablet (60 mg total) by mouth 2 (two) times daily.  . [DISCONTINUED] aspirin 81 MG chewable tablet Chew 1 tablet (81 mg total) by mouth daily.  . [DISCONTINUED] atorvastatin (LIPITOR) 80 MG tablet Take 80 mg  Tablet every other day  and alternate the other day with 40 mg ( 1/2  tablet)  . [DISCONTINUED] carvedilol (COREG) 6.25 MG tablet Take 1 tablet (6.25 mg total) by mouth 2 (two) times daily with a meal.  . [DISCONTINUED] ticagrelor (BRILINTA) 90 MG TABS tablet Take 1 tablet (90 mg total) by mouth 2 (two) times daily.    No Known Allergies  SOCIAL HISTORY/FAMILY HISTORY   Reviewed in Epic:  Pertinent findings:  Social History   Tobacco Use  . Smoking status: Never Smoker  . Smokeless tobacco: Never Used  Substance Use Topics  . Alcohol use: Never  . Drug use: Never   Social History   Social History Narrative   Lives with his wife in [place].      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    OBJCTIVE -PE, EKG, labs   Wt Readings from Last 3 Encounters:  05/25/20 182 lb (82.6 kg)  05/14/20 182 lb 3.2 oz (82.6 kg)  01/23/20 179 lb (81.2 kg)    Physical Exam: BP 104/70 (BP Location: Left Arm, Patient Position: Sitting, Cuff Size: Normal)   Pulse (!) 58   Ht 5\' 7"  (1.702 m)   Wt 182 lb (82.6 kg)   BMI 28.51 kg/m  Physical Exam Constitutional:      General: He is not in acute distress.    Appearance: Normal appearance. He is normal weight. He is not ill-appearing (healthy) or toxic-appearing.  HENT:     Head: Normocephalic and atraumatic.  Neck:     Vascular: No carotid bruit.  Cardiovascular:     Rate and Rhythm: Regular rhythm. Bradycardia present.     Pulses: Normal pulses.     Heart sounds: Normal heart sounds. No murmur heard. No friction rub. No gallop.   Pulmonary:     Effort: Pulmonary effort is normal. No respiratory distress.     Breath sounds: Normal breath sounds.  Chest:     Chest wall: No tenderness.  Musculoskeletal:        General: No swelling. Normal range of motion.     Cervical back: Normal range of motion and neck supple.  Skin:    General: Skin is warm and dry.     Findings: No bruising.  Neurological:     General: No focal deficit present.     Mental Status: He is alert  and oriented to person, place, and time. Mental status is at baseline.     Motor: No weakness.     Gait: Gait normal.  Psychiatric:        Mood and Affect: Mood normal.        Behavior: Behavior normal.        Thought Content: Thought content normal.        Judgment: Judgment normal.      Adult ECG Report n/a  Recent Labs:  Reviewed (statin dose reduced to alternating 40mg  & 80 mg)  Lab Results  Component Value Date   CHOL 115 05/14/2020   HDL 33.50 (L) 05/14/2020   LDLCALC 62 05/14/2020   TRIG 101.0 05/14/2020   CHOLHDL 3 05/14/2020   Lab Results  Component Value Date   CREATININE 0.93 05/14/2020   BUN 15 05/14/2020   NA 139 05/14/2020   K 4.2 05/14/2020   CL 105 05/14/2020   CO2 28 05/14/2020   CBC Latest Ref Rng & Units 05/14/2020 09/24/2019 09/23/2019  WBC 4.0 - 10.5 K/uL 7.7 10.3 8.1  Hemoglobin 13.0 - 17.0 g/dL 11/24/2019 11/23/2019 32.3  Hematocrit 39.0 - 52.0 % 46.4 46.0 45.0  Platelets 150.0 - 400.0 K/uL 238.0 238 230    Lab Results  Component Value Date   TSH 1.221 05/29/2019    ==================================================  COVID-19 Education: The signs and symptoms of COVID-19 were discussed with the patient and how to seek care for testing (follow up with PCP or arrange E-visit).   The  importance of social distancing and COVID-19 vaccination was discussed today. The patient is practicing social distancing & Masking.   I spent a total of with the patient spent in direct patient consultation.  Additional time spent with chart review  / charting (studies, outside notes, etc): 12 min Total Time: 35 min   Current medicines are reviewed at length with the patient today.  (+/- concerns) n/a  This visit occurred during the SARS-CoV-2 public health emergency.  Safety protocols were in place, including screening questions prior to the visit, additional usage of staff PPE, and extensive cleaning of exam room while observing appropriate contact time as  indicated for disinfecting solutions.  Notice: This dictation was prepared with Dragon dictation along with smaller phrase technology. Any transcriptional errors that result from this process are unintentional and may not be corrected upon review.  Patient Instructions / Medication Changes & Studies & Tests Ordered   Patient Instructions  Medication Instructions:  Stop aspirin  Complete  of Brilinta bottle, then stop, then begin  Brilinta twice a day.  Take a half tablet of carvedilol (3.125mg ) in the morning, and a whole tablet of carvedilol (6.25mg ) in the evening.   Start a statin holiday (no atorvastatin) for 1 month. If no changes with leg fatigue, restart atorvastatin at /day. If leg fatigue improves, contact the office.   *If you need a refill on your cardiac medications before your next appointment, please call your pharmacy*   Lab Work: Labs in 4-5 months. CMP, Lipid panel.    Testing/Procedures: To be scheduled at [address] [facility] [address]. Your physician has requested that you have an ankle brachial index (ABI). During this test an ultrasound and blood pressure cuff are used to evaluate the arteries that supply the arms and legs with blood. Allow thirty minutes for this exam. There are no restrictions or special instructions.     Follow-Up: At [facility], you and your health needs are our priority.  As part of our continuing mission to provide you with exceptional heart care, we have created designated Provider Care Teams.  These Care Teams include your primary Cardiologist (physician) and Advanced Practice Providers (APPs -  Physician Assistants and Nurse Practitioners) who all work together to provide you with the care you need, when you need it.  We recommend signing up for the patient portal called "MyChart".  Sign up information is provided on this After Visit Summary.  MyChart is used to connect with patients for Virtual Visits (Telemedicine).   Patients are able to view lab/test results, encounter notes, upcoming appointments, etc.  Non-urgent messages can be sent to your provider as well.   To learn more about what you can do with MyChart, go to [email or web address].    Your next appointment:   6 month(s)  The format for your next appointment:   In Person  Provider:   [name], MD    Studies Ordered:   Orders Placed This Encounter  Procedures  . Lipid panel  . Comprehensive Metabolic Panel (CMET)  . VAS [place] LE ART SEG MULTI (Segm&LE Reynauds)     [name] Lemma, M.D., M.S. Interventional Cardiologist   Pager # [phone] Phone # [phone] [address] [place], [place] [address]   Thank you for choosing Heart[facility] at [facility]!!

## 2020-05-26 ENCOUNTER — Other Ambulatory Visit (HOSPITAL_COMMUNITY): Payer: Self-pay | Admitting: Cardiology

## 2020-05-26 DIAGNOSIS — I739 Peripheral vascular disease, unspecified: Secondary | ICD-10-CM

## 2020-06-04 ENCOUNTER — Ambulatory Visit (HOSPITAL_COMMUNITY)
Admission: RE | Admit: 2020-06-04 | Discharge: 2020-06-04 | Disposition: A | Discharge status: Home / Self Care | Payer: BC Managed Care – PPO | Source: Ambulatory Visit | Attending: Cardiology | Admitting: Cardiology

## 2020-06-04 ENCOUNTER — Other Ambulatory Visit: Payer: Self-pay

## 2020-06-04 DIAGNOSIS — I739 Peripheral vascular disease, unspecified: Secondary | ICD-10-CM | POA: Diagnosis not present

## 2020-06-04 DIAGNOSIS — R29898 Other symptoms and signs involving the musculoskeletal system: Secondary | ICD-10-CM | POA: Diagnosis not present

## 2020-06-10 ENCOUNTER — Encounter: Payer: Self-pay | Admitting: Cardiology

## 2020-06-10 NOTE — Assessment & Plan Note (Signed)
Resolved with most recent EF of 55 to 60%.  Is on low-dose losartan and moderate dose carvedilol.

## 2020-06-10 NOTE — Assessment & Plan Note (Signed)
Extensive LAD PCI.  Anticipate him being on lifelong Thienopyridine coverage.  No recurrent angina symptoms and the number of improved EF after anterior STEMI.  Now 1 year out.  Plan:  Continue carvedilol losartan).  1 month statin holiday for leg fatigue, adjust accordingly, may need to switch to rosuvastatin.  On DAPT currently.  Okay to DC aspirin and reduce Brilinta to 60 mg twice daily.  Hopefully this will help some of the dyspnea.  Okay to hold Brilinta 5 days preop for surgical procedures.

## 2020-06-10 NOTE — Assessment & Plan Note (Signed)
Need to exclude statin related myalgias but also need to evaluate for PVD.  Plan:  Check ABIs along with LEA dopplers  Statin holiday & adjust pending on Sx.

## 2020-06-10 NOTE — Assessment & Plan Note (Signed)
1 year out from anterior STEMI with extensive PCI were done to the LAD.  Also has known occlusion of the RCA with left-to-right collaterals and 80% OM lesion being treated medically.  No recurrent angina or significant CHF symptoms.  I think some of his dyspnea is related to Brilinta. EF improved almost back to baseline normal EF.    Plan:  1 year PCI, okay to reduce to maintenance dose Brilinta 60 mg twice daily and stop aspirin.

## 2020-06-10 NOTE — Assessment & Plan Note (Signed)
Pretty well controlled blood pressure on current dose of carvedilol and losartan.

## 2020-06-10 NOTE — Assessment & Plan Note (Addendum)
LDL looks great at 62.  He is currently on alternating 80 mg and 40 mg tablets of atorvastatin. He is having some leg fatigue.  Concerned that this could be myalgias from statin.  Plan: 1 month statin holiday.  If 40 mg daily.  If leg fatigue improves, will likely switch to rosuvastatin 20 mg daily -- 4-5 month lab recheck.

## 2020-06-16 ENCOUNTER — Other Ambulatory Visit: Payer: Self-pay | Admitting: Adult Health

## 2020-06-18 ENCOUNTER — Encounter: Payer: Self-pay | Admitting: Neurology

## 2020-06-18 ENCOUNTER — Ambulatory Visit (INDEPENDENT_AMBULATORY_CARE_PROVIDER_SITE_OTHER): Payer: BC Managed Care – PPO | Admitting: Neurology

## 2020-06-18 ENCOUNTER — Other Ambulatory Visit: Payer: Self-pay

## 2020-06-18 VITALS — BP 122/74 | HR 52 | Ht 67.0 in | Wt 180.0 lb

## 2020-06-18 DIAGNOSIS — G4719 Other hypersomnia: Secondary | ICD-10-CM

## 2020-06-18 DIAGNOSIS — E663 Overweight: Secondary | ICD-10-CM | POA: Diagnosis not present

## 2020-06-18 DIAGNOSIS — R519 Headache, unspecified: Secondary | ICD-10-CM

## 2020-06-18 DIAGNOSIS — R0683 Snoring: Secondary | ICD-10-CM | POA: Diagnosis not present

## 2020-06-18 DIAGNOSIS — G4763 Sleep related bruxism: Secondary | ICD-10-CM

## 2020-06-18 DIAGNOSIS — G4733 Obstructive sleep apnea (adult) (pediatric): Secondary | ICD-10-CM | POA: Diagnosis not present

## 2020-06-18 DIAGNOSIS — R351 Nocturia: Secondary | ICD-10-CM

## 2020-06-18 DIAGNOSIS — I252 Old myocardial infarction: Secondary | ICD-10-CM

## 2020-06-18 NOTE — Progress Notes (Signed)
Subjective:    Patient ID: [name] [name] is a 55 y.o. male.  HPI     [name] Foley, MD, PhD [facility] [address], [address] [address] [place], [place] [address]  Dear Dr. Doreene [name],   I saw your patient, [name] [name], upon your kind request, in my Sleep clinic today for initial consultation of his sleep disorder, in particular, evaluation of his prior diagnosis of obstructive sleep apnea.  The patient is unaccompanied today.  As you know, [name] [name] is a 55 year old right-handed gentleman with an underlying medical history of MI with status post 4 coronary stent placements in April 2021, hyperlipidemia, and overweight state, who reports a prior diagnosis of obstructive sleep apnea.  He had a home sleep test at least 5+ years ago which was abnormal.  He was supposed to go back for an in lab study, probably for titration but never pursued this, also had lack of insurance coverage for this test as I understand.  He reports snoring and daytime somnolence, Epworth sleepiness score is 11 out of 24, fatigue severity score is 22 out of 63.  He has occasional morning headaches but attributes these to tooth grinding.  He has a longer standing history of bruxism and has had a dentist made occlusive guard but it made his tooth grinding worse.  He has had nocturia, on average once per average night.  He goes to bed around 11, rise time is around 530 or 6 AM.  His sleep is not restful.  He suspects or he recalls that his father had sleep apnea but is not sure if he still has a CPAP machine.  Patient works in Librarian, academic.  He lives with his wife and 2 of his 3 stepchildren, ages 66 and 59, his own biological children are in [place], they are 25 and 20.  They have no pets in the household.  He has a TV in the bedroom and it tends to be on at night per wife's preference but if she is asleep, he reported of or with the TV on the sleep timer.  He  is a non-smoker, he drinks alcohol rarely, on special occasions, no daily caffeine.  His Past Medical History Is Significant For: Past Medical History:  Diagnosis Date  . History of acute anterior wall MI 05/29/2019   100% LAD after SP1 -> (long lesion); 90% prox OM 2 (mod LPL 60%), ~100% CTO of RPDA (high bifurcation, fills via L-R collateral flow) - 3 overlapping DES in LAD & 1 in OM2  . Hyperlipidemia LDL goal <70   . Multiple vessel coronary artery disease 05/29/2019   Cath 05/29/2019 : Anterior STEMI -> Severe Three-Vessel Disease:  CULPRIT LESION 100% LAD after SP1 (long); 90% pOM 2 (with mod LPL), ~100 CTO of RPDA (high bifurcation, L-R collateral); Successful DES PCI p-mLAD [name]/ 3 overlapping Resolute Onyx DES (3.0 x 12, 2.5 x 38, & 2.25 x 15 --> 3.65-2.8 mm), OM2 Resolute Onyx DES 2 x 12 - 2.2 mm.     His Past Surgical History Is Significant For: Past Surgical History:  Procedure Laterality Date  . CORONARY STENT INTERVENTION N/A 05/29/2019   Procedure: CORONARY STENT INTERVENTION;  Surgeon: [name] Lex, MD;  Location: [facility] INVASIVE CV LAB;  Service: Cardiovascular;; 2nd LESION: OM 2 with Resolute Onyx DES 2.0 mm x 12 mm--postdilated to 2.2 mm)  . CORONARY/GRAFT ACUTE MI REVASCULARIZATION N/A 05/29/2019   Procedure: CORONARY/GRAFT ACUTE MI REVASCULARIZATION;  Surgeon: [name] LexHarding, [name] W, MD;  [facility];  Service: Cardiovascular;  Successful DES PCI of extensive proximal to mid LAD using 3 overlapping Resolute Onyx DES stents (3.0 mm x 12 mm, 2.5 mm x 38 mm, and 2.25 mm x 15 mm -> entire segment postdilated from 3.65 mm down to 2.8 mm)   . LEFT HEART CATH AND CORONARY ANGIOGRAPHY N/A 05/29/2019   Procedure: LEFT HEART CATH AND CORONARY ANGIOGRAPHY;  Surgeon: [name] LexHarding, [name] W, MD;  [facility];  Service: Cardiovascular;; CULPRIT LESION 100% LAD after SP1 (long lesion); 90% proximal OM 2 (with moderate LPL), 100% CTO of RPDA (high bifurcation, fills via L-R  collaterals; EF ~35-40% mid-apical Hypo-AK. LVEDP 22 mmHg.  Marland Kitchen. TRANSTHORACIC ECHOCARDIOGRAM  09/2019   EF ~ 55-60%, Mid-apical Inferoseptal HK & AK of apical and anteroseptal wall.  Gr II DD.  Normal RV size and function.  Mild aortic sclerosis.  . TRANSTHORACIC ECHOCARDIOGRAM  05/30/2019    (Anterior STEMI) --> EF 35 to 40%.  Entire anterior wall, mid and distal anterior septum and apex are akinetic.  Mild LVH, GR 1 DD.    His Family History Is Significant For: Family History  Problem Relation Age of Onset  . Hypertension Mother     His Social History Is Significant For: Social History   Socioeconomic History  . Marital status: Married    Spouse name: Not on file  . Number of children: Not on file  . Years of education: 4612  . Highest education level: High school graduate  Occupational History  . Occupation: Editor, commissioningnstalls fire systems    Employer: [employer]  Tobacco Use  . Smoking status: Never Smoker  . Smokeless tobacco: Never Used  Substance and Sexual Activity  . Alcohol use: Never  . Drug use: Never  . Sexual activity: Not on file  Other Topics Concern  . Not on file  Social History Narrative   [place] in [place]      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   Social Determinants of Health   Financial Resource Strain: Not on file  Food Insecurity: Not on file  Transportation Needs: Not on file  Physical Activity: Not on file  Stress: Not on file  Social Connections: Not on file    His Allergies Are:  No Known Allergies:   His Current Medications Are:  Outpatient Encounter Medications as of 06/18/2020  Medication Sig  . atorvastatin (LIPITOR) 40 MG tablet Take 1 tablet (40 mg total) by mouth daily.  . carvedilol (COREG) 6.25 MG tablet Take 3.125mg  (half tablet of 6.25) in the morning and 6.25mg  (whole tablet) in the evening.  Marland Kitchen. losartan (COZAAR) 25 MG tablet Take 0.5 tablets (12.5 mg total) by mouth  daily.  . nitroGLYCERIN (NITROSTAT) 0.4 MG SL tablet Place 1 tablet (0.4 mg total) under the tongue every 5 (five) minutes as needed for chest pain (up to 3 doses. If taking 3rd dose, call 911.).  Marland Kitchen. ticagrelor (BRILINTA) 60 MG TABS tablet Take 1 tablet (60 mg total) by mouth 2 (two) times daily.   No facility-administered encounter medications on file as of 06/18/2020.  :   Review of Systems:  Out of a complete 14 point review of systems, all are reviewed and negative with the exception of these symptoms as listed below:  Review of Systems  Neurological:       Here  for sleep consult. Prior sleep study reports results were not normal and a repeat in lab study was recommended but he never pursued. Never been on CPAP. Snoring is present. HX of MI  Epworth Sleepiness Scale 0= would never doze 1= slight chance of dozing 2= moderate chance of dozing 3= high chance of dozing  Sitting and reading:1 Watching TV:2 Sitting inactive in a public place (ex. Theater or meeting):1 As a passenger in a car for an hour without a break:3 Lying down to rest in the afternoon:2 Sitting and talking to someone:0 Sitting quietly after lunch (no alcohol):2 In a car, while stopped in traffic:0 Total:11     Objective:  Neurological Exam  Physical Exam Physical Examination:   Vitals:   06/18/20 1323  BP: 122/74  Pulse: (!) 52    General Examination: The patient is a very pleasant 56 y.o. male in no acute distress. He appears well-developed and well-nourished and well groomed.   HEENT: Normocephalic, atraumatic, pupils are equal, round and reactive to light, extraocular tracking is good without limitation to gaze excursion or nystagmus noted. Hearing is grossly intact. Face is symmetric with normal facial animation. Speech is clear with no dysarthria noted. There is no hypophonia. There is no lip, neck/head, jaw or voice tremor. Neck is supple with full range of passive and active motion. There are no  carotid bruits on auscultation. Oropharynx exam reveals: mild mouth dryness, adequate dental hygiene with evidence of teeth grinding, moderate airway crowding secondary to small airway entry, Mallampati class III, tonsillar size of about 1-2+ on the right and 1+ on the left.  He does not have an overbite, slight underbite and slightly skewed teeth alignment.  Tongue protrudes centrally and palate elevates symmetrically, neck circumference of 16-3/8 inches.   Chest: Clear to auscultation without wheezing, rhonchi or crackles noted.  Heart: S1+S2+0, regular and normal without murmurs, rubs or gallops noted.   Abdomen: Soft, non-tender and non-distended with normal bowel sounds appreciated on auscultation.  Extremities: There is no pitting edema in the distal lower extremities bilaterally.   Skin: Warm and dry without trophic changes noted.   Musculoskeletal: exam reveals no obvious joint deformities, tenderness or joint swelling or erythema.   Neurologically:  Mental status: The patient is awake, alert and oriented in all 4 spheres. His immediate and remote memory, attention, language skills and fund of knowledge are appropriate. There is no evidence of aphasia, agnosia, apraxia or anomia. Speech is clear with normal prosody and enunciation. Thought process is linear. Mood is normal and affect is normal.  Cranial nerves II - XII are as described above under HEENT exam.  Motor exam: Normal bulk, strength and tone is noted. There is no tremor, Romberg is negative. Fine motor skills and coordination: grossly intact.  Cerebellar testing: No dysmetria or intention tremor. There is no truncal or gait ataxia.  Sensory exam: intact to light touch in the upper and lower extremities.  Gait, station and balance: He stands easily. No veering to one side is noted. No leaning to one side is noted. Posture is age-appropriate and stance is narrow based. Gait shows normal stride length and normal pace. No problems  turning are noted. Tandem walk is unremarkable.                Assessment and Plan:  In summary, Kester Stimpson is a very pleasant 55 y.o.-year old male with an underlying medical history of MI with status post 4 coronary stent placements in April  2021, hyperlipidemia, and overweight state, whose history and physical exam are in keeping with obstructive sleep apnea (OSA). I had a long chat with the patient about my findings and the diagnosis of OSA, its prognosis and treatment options. We talked about medical treatments, surgical interventions and non-pharmacological approaches. I explained in particular the risks and ramifications of untreated moderate to severe OSA, especially with respect to developing cardiovascular disease down the Road, including congestive heart failure, difficult to treat hypertension, cardiac arrhythmias, or stroke. Even type 2 diabetes has, in part, been linked to untreated OSA. Symptoms of untreated OSA include daytime sleepiness, memory problems, mood irritability and mood disorder such as depression and anxiety, lack of energy, as well as recurrent headaches, especially morning headaches. We talked about maintaining healthy lifestyle in general, as well as the importance of weight control. We also talked about the importance of good sleep hygiene. I recommended the following at this time: sleep study.   I explained the sleep test procedure to the patient and also outlined possible surgical and non-surgical treatment options of OSA, including the use of a custom-made dental device (which would require a referral to a specialist dentist or oral surgeon), upper airway surgical options, such as traditional UPPP or a novel less invasive surgical option in the form of Inspire hypoglossal nerve stimulation (which would involve a referral to an ENT surgeon). I also explained the CPAP treatment option to the patient, who indicated that he would be willing to try CPAP if the need  arises. I explained the importance of being compliant with PAP treatment, not only for insurance purposes but primarily to improve His symptoms, and for the patient's long term health benefit, including to reduce His cardiovascular risks. I answered all his questions today and the patient was in agreement. I plan to see him back after the sleep study is completed and encouraged him to call with any interim questions, concerns, problems or updates.   Thank you very much for allowing me to participate in the care of this nice patient. If I can be of any further assistance to you please do not hesitate to call me at [phone].  Sincerely,   [name] Foley, MD, PhD

## 2020-06-18 NOTE — Patient Instructions (Signed)

## 2020-06-23 DIAGNOSIS — L720 Epidermal cyst: Secondary | ICD-10-CM | POA: Diagnosis not present

## 2020-07-29 ENCOUNTER — Telehealth: Payer: Self-pay | Admitting: Family Medicine

## 2020-07-29 DIAGNOSIS — Z0000 Encounter for general adult medical examination without abnormal findings: Secondary | ICD-10-CM

## 2020-07-29 NOTE — Telephone Encounter (Signed)
Patients wife Clydie Braun is requesting orders be placed for a Colonscopy. Patient would like it done within [facility]. Please call wife at [phone] if you have any questions. She said that patient would like it done as soon as possible because he's overdue.

## 2020-07-30 ENCOUNTER — Ambulatory Visit (INDEPENDENT_AMBULATORY_CARE_PROVIDER_SITE_OTHER): Payer: BC Managed Care – PPO | Admitting: Neurology

## 2020-07-30 ENCOUNTER — Other Ambulatory Visit: Payer: Self-pay

## 2020-07-30 DIAGNOSIS — G4763 Sleep related bruxism: Secondary | ICD-10-CM

## 2020-07-30 DIAGNOSIS — G4719 Other hypersomnia: Secondary | ICD-10-CM

## 2020-07-30 DIAGNOSIS — R519 Headache, unspecified: Secondary | ICD-10-CM

## 2020-07-30 DIAGNOSIS — E663 Overweight: Secondary | ICD-10-CM

## 2020-07-30 DIAGNOSIS — R0683 Snoring: Secondary | ICD-10-CM

## 2020-07-30 DIAGNOSIS — G4733 Obstructive sleep apnea (adult) (pediatric): Secondary | ICD-10-CM | POA: Diagnosis not present

## 2020-07-30 DIAGNOSIS — G4720 Circadian rhythm sleep disorder, unspecified type: Secondary | ICD-10-CM

## 2020-07-30 DIAGNOSIS — I252 Old myocardial infarction: Secondary | ICD-10-CM

## 2020-07-30 DIAGNOSIS — R351 Nocturia: Secondary | ICD-10-CM

## 2020-08-03 NOTE — Addendum Note (Signed)
Addended by: [name] on: 08/03/2020 05:25 PM   Modules accepted: Orders

## 2020-08-03 NOTE — Procedures (Signed)
PATIENT'S NAME:  [name] [name], [name] [name] DOB:      [birth year]      MR#:    [id number]     DATE OF RECORDING: 07/30/2020 REFERRING M.D.:  Nadene Rubins, MD Study Performed:   Baseline Polysomnogram HISTORY: 55 year old man with a history of MI with status post 4 coronary stent placements in April 2021, hyperlipidemia, and overweight state, who reports a prior diagnosis of obstructive sleep apnea. He reports snoring and daytime somnolence, as well as occasional morning headaches and nocturia. The patient endorsed the Epworth Sleepiness Scale at 11 points. The patient's weight 180 pounds with a height of 67 (inches), resulting in a BMI of 28.4 kg/m2. The patient's neck circumference measured 16.2 inches.  CURRENT MEDICATIONS: Lipitor, Coreg, Cozaar, Nitrostat, Brilinta   PROCEDURE:  This is a multichannel digital polysomnogram utilizing the Somnostar 11.2 system.  Electrodes and sensors were applied and monitored per AASM Specifications.   EEG, EOG, Chin and Limb EMG, were sampled at 200 Hz.  ECG, Snore and Nasal Pressure, Thermal Airflow, Respiratory Effort, CPAP Flow and Pressure, Oximetry was sampled at 50 Hz. Digital video and audio were recorded.      BASELINE STUDY  Lights Out was at 22:05 and Lights On at 04:56.  Total recording time (TRT) was 411.5 minutes, with a total sleep time (TST) of 326.5 minutes.   The patient's sleep latency was 16.5 minutes.  REM latency was 72.5 minutes, which is normal.  The sleep efficiency was 79.3 %.     SLEEP ARCHITECTURE: WASO (Wake after sleep onset) was 68 minutes with overall mild sleep fragmentation noted. There were 11 minutes in Stage N1, 199 minutes Stage N2, 19.5 minutes Stage N3 and 97 minutes in Stage REM.  The percentage of Stage N1 was 3.4%, Stage N2 was 60.9%, which is mildly increased, Stage N3 was 6.% and Stage R (REM sleep) was 29.7%, which is increased. The arousals were noted as: 28 were spontaneous, 0 were associated with PLMs, 46 were associated with  respiratory events.  RESPIRATORY ANALYSIS:  There were a total of 63 respiratory events:  20 obstructive apneas, 1 central apneas and 7 mixed apneas with a total of 28 apneas and an apnea index (AI) of 5.1 /hour. There were 35 hypopneas with a hypopnea index of 6.4 /hour. The patient also had 0 respiratory event related arousals (RERAs).      The total APNEA/HYPOPNEA INDEX (AHI) was 11.6/hour and the total RESPIRATORY DISTURBANCE INDEX was  11.6 /hour.  31 events occurred in REM sleep and 35 events in NREM. The REM AHI was  19.2 /hour, versus a non-REM AHI of 8.4. The patient spent 113.5 minutes of total sleep time in the supine position and 213 minutes in non-supine.. The supine AHI was 13.2 versus a non-supine AHI of 10.7.  OXYGEN SATURATION & C02:  The Wake baseline 02 saturation was 96%, with the lowest being 83%. Time spent below 89% saturation equaled 8 minutes.    PERIODIC LIMB MOVEMENTS: The patient had a total of 0 Periodic Limb Movements.  The Periodic Limb Movement (PLM) index was 0 and the PLM Arousal index was 0/hour.  Audio and video analysis did not show any abnormal or unusual movements, behaviors, phonations or vocalizations. The patient took 1 bathroom break. Mild to moderate snoring was noted. The EKG was in keeping with normal sinus rhythm (NSR).  Post-study, the patient indicated that sleep was the same as usual.   IMPRESSION:  Obstructive Sleep Apnea (OSA) Dysfunctions associated with  sleep stages or arousal from sleep  RECOMMENDATIONS:  This study demonstrates overall mild obstructive sleep apnea, moderate during REM sleep with a total AHI of 11.6/hour, REM AHI of 19.2/hour, supine AHI of 13.2/hour and O2 nadir of 83%. Given the patient's medical history and sleep related complaints, treatment with positive airway pressure is recommended; this can be achieved in the form of autoPAP. Alternatively, a full-night CPAP titration study would allow optimization of therapy if  needed. Other treatment options may include avoidance of supine sleep position along with weight loss, upper airway or jaw surgery in selected patients or the use of an oral appliance in certain patients. ENT evaluation and/or consultation with a maxillofacial surgeon or dentist may be feasible in some instances.    Please note that untreated obstructive sleep apnea may carry additional perioperative morbidity. Patients with significant obstructive sleep apnea should receive perioperative PAP therapy and the surgeons and particularly the anesthesiologist should be informed of the diagnosis and the severity of the sleep disordered breathing. The patient should be cautioned not to drive, work at heights, or operate dangerous or heavy equipment when tired or sleepy. Review and reiteration of good sleep hygiene measures should be pursued with any patient. The patient will be seen in follow-up by Dr. Frances Furbish at [facility] for discussion of the test results and further management strategies. The referring provider will be notified of the test results.  I certify that I have reviewed the entire raw data recording prior to the issuance of this report in accordance with the Standards of Accreditation of the American Academy of Sleep Medicine (AASM)  [name], MD, PhD Diplomat, American Board of Neurology and Sleep Medicine (Neurology and Sleep Medicine)

## 2020-08-03 NOTE — Telephone Encounter (Signed)
Patient notified VIA phone that referral has been placed.  Dm/cma

## 2020-08-03 NOTE — Progress Notes (Signed)
Patient referred by Dr. Doreene Burke, seen by me on 06/18/20, diagnostic PSG on 07/30/20.    Please call and notify the patient that the recent sleep study did confirm the diagnosis of obstructive sleep apnea. OSA is overall mild, but worth treating to see if he feels better after treatment. To that end I recommend treatment for this in the form of autoPAP, which means, that we don't have to bring him back for a second sleep study with CPAP, but will let him try an autoPAP machine at home, through a DME company (of his choice, or as per insurance requirement). The DME representative will educate him on how to use the machine, how to put the mask on, etc. I have placed an order in the chart. Please send referral, talk to patient, send report to referring MD. We will need a FU in sleep clinic for 10 weeks post-PAP set up, please arrange that with me or one of our NPs. Thanks,   [name], MD, PhD [facility])

## 2020-08-04 ENCOUNTER — Telehealth: Payer: Self-pay

## 2020-08-04 NOTE — Telephone Encounter (Signed)
I called pt. No answer, left a message asking pt to call me back.   

## 2020-08-04 NOTE — Telephone Encounter (Signed)
-----   Message from [name], MD sent at 08/03/2020  5:25 PM EDT ----- Patient referred by Dr. Doreene Burke, seen by me on 06/18/20, diagnostic PSG on 07/30/20.    Please call and notify the patient that the recent sleep study did confirm the diagnosis of obstructive sleep apnea. OSA is overall mild, but worth treating to see if he feels better after treatment. To that end I recommend treatment for this in the form of autoPAP, which means, that we don't have to bring him back for a second sleep study with CPAP, but will let him try an autoPAP machine at home, through a DME company (of his choice, or as per insurance requirement). The DME representative will educate him on how to use the machine, how to put the mask on, etc. I have placed an order in the chart. Please send referral, talk to patient, send report to referring MD. We will need a FU in sleep clinic for 10 weeks post-PAP set up, please arrange that with me or one of our NPs. Thanks,   [name], MD, PhD [facility])

## 2020-08-10 NOTE — Telephone Encounter (Signed)
I called pt. I advised pt that Dr. Frances Furbish reviewed their sleep study results and found that pt has mild OSA. Dr. Frances Furbish recommends that pt start autopap for treatment. I reviewed PAP compliance expectations with the pt. Pt is agreeable to starting an auto-PAP. I advised pt that an order will be sent to a DME, [facility], and [facility] will call the pt within about one week after they file with the pt's insurance. [facility] will show the pt how to use the machine, fit for masks, and troubleshoot the auto-PAP if needed. Pt will call back and let us know once he is started on his autopap machine and we will schedule 31-90 day f/u appt. A letter with all of this information in it will be mailed to the pt as a reminder. I verified with the pt that the address we have on file is correct. Pt verbalized understanding of results. Pt had no questions at this time but was encouraged to call back if questions arise. I have sent the order to [facility] and have received confirmation that they have received the order.

## 2020-08-18 ENCOUNTER — Telehealth: Payer: Self-pay | Admitting: Cardiology

## 2020-08-18 NOTE — Telephone Encounter (Signed)
Pt c/o of Chest Pain: STAT if CP now or developed within 24 hours  1. Are you having CP right now?  Unsure. Patient's wife is not currently with the patient.  2. Are you experiencing any other symptoms (ex. SOB, nausea, vomiting, sweating)?  Arm numbness (patient's wife unsure of which arm or if numbness is in both arms)  3. How long have you been experiencing CP?  2-3 days, patient's wife states the patient informed her he was feeling this way on and off over the weekend  4. Is your CP continuous or coming and going? Coming and going   5. Have you taken Nitroglycerin?  Clydie Braun states the patient has nitro, but hasn't taken any. She states a call may be returned to the patient to discuss further. ?

## 2020-08-18 NOTE — Telephone Encounter (Signed)
Called patient, he states that for the last 2-3 days he has had coming and going chest pain, he states that he gets SOB with small activity. He was working today and had to just sit down and rest. He states he has had arm numbness in his right arm. He does not state of any weight gain or swelling. He does mention having a bad headache, and he is unaware of his BP/HR. He was advised with cardiac history and symptoms to go to the ED. Patient verbalized understanding. Will route to MD to make aware.

## 2020-08-19 ENCOUNTER — Emergency Department (HOSPITAL_COMMUNITY): Payer: BC Managed Care – PPO

## 2020-08-19 ENCOUNTER — Observation Stay (HOSPITAL_COMMUNITY)
Admission: EM | Admit: 2020-08-19 | Discharge: 2020-08-21 | Disposition: A | Discharge status: Home / Self Care | Payer: BC Managed Care – PPO | Attending: Internal Medicine | Admitting: Internal Medicine

## 2020-08-19 ENCOUNTER — Encounter (HOSPITAL_COMMUNITY): Payer: Self-pay | Admitting: Emergency Medicine

## 2020-08-19 DIAGNOSIS — I25110 Atherosclerotic heart disease of native coronary artery with unstable angina pectoris: Secondary | ICD-10-CM | POA: Diagnosis not present

## 2020-08-19 DIAGNOSIS — R079 Chest pain, unspecified: Secondary | ICD-10-CM | POA: Diagnosis not present

## 2020-08-19 DIAGNOSIS — Z79899 Other long term (current) drug therapy: Secondary | ICD-10-CM | POA: Diagnosis not present

## 2020-08-19 DIAGNOSIS — I10 Essential (primary) hypertension: Secondary | ICD-10-CM | POA: Diagnosis present

## 2020-08-19 DIAGNOSIS — E785 Hyperlipidemia, unspecified: Secondary | ICD-10-CM | POA: Diagnosis present

## 2020-08-19 DIAGNOSIS — I200 Unstable angina: Secondary | ICD-10-CM | POA: Diagnosis present

## 2020-08-19 DIAGNOSIS — I5041 Acute combined systolic (congestive) and diastolic (congestive) heart failure: Secondary | ICD-10-CM | POA: Diagnosis not present

## 2020-08-19 DIAGNOSIS — R0602 Shortness of breath: Secondary | ICD-10-CM | POA: Diagnosis not present

## 2020-08-19 DIAGNOSIS — Z20822 Contact with and (suspected) exposure to covid-19: Secondary | ICD-10-CM | POA: Insufficient documentation

## 2020-08-19 DIAGNOSIS — I110 Hypertensive heart disease with heart failure: Secondary | ICD-10-CM | POA: Diagnosis not present

## 2020-08-19 DIAGNOSIS — Z9861 Coronary angioplasty status: Secondary | ICD-10-CM

## 2020-08-19 DIAGNOSIS — I2510 Atherosclerotic heart disease of native coronary artery without angina pectoris: Secondary | ICD-10-CM

## 2020-08-19 LAB — TROPONIN I (HIGH SENSITIVITY)
Troponin I (High Sensitivity): 5 ng/L (ref <18)
Troponin I (High Sensitivity): 5 ng/L (ref <18)

## 2020-08-19 LAB — BASIC METABOLIC PANEL
Anion gap: 8 (ref 5–15)
BUN: 11 mg/dL (ref 6–20)
CO2: 24 mmol/L (ref 22–32)
Calcium: 9.2 mg/dL (ref 8.9–10.3)
Chloride: 104 mmol/L (ref 98–111)
Creatinine, Ser: 1.01 mg/dL (ref 0.61–1.24)
GFR, Estimated: >60 mL/min (ref >60)
Glucose, Bld: 130 mg/dL — ABNORMAL HIGH (ref 70–99)
Potassium: 3.4 mmol/L — ABNORMAL LOW (ref 3.5–5.1)
Sodium: 136 mmol/L (ref 135–145)

## 2020-08-19 LAB — CBC
HCT: 44.2 % (ref 39.0–52.0)
Hemoglobin: 15.4 g/dL (ref 13.0–17.0)
MCH: 31.2 pg (ref 26.0–34.0)
MCHC: 34.8 g/dL (ref 30.0–36.0)
MCV: 89.5 fL (ref 80.0–100.0)
Platelets: 255 10*3/uL (ref 150–400)
RBC: 4.94 MIL/uL (ref 4.22–5.81)
RDW: 11.8 % (ref 11.5–15.5)
WBC: 7.7 10*3/uL (ref 4.0–10.5)
nRBC: 0 % (ref 0.0–0.2)

## 2020-08-19 NOTE — H&P (Signed)
Cardiology Admission History and Physical:   Patient ID: [name] [name] MRN: [id number]; DOB: Jun 13, 1965   Admission date: 08/19/2020  PCP:  [name] Sax, MD   [facility] Providers Cardiologist:  [name] Lemma, MD   {  Chief Complaint: Chest pain and shortness of breath  Patient Profile:   [name] [name] is a 55 y.o. male with CAD, ischemic cardiomyopathy with improved LV function, hyperlipidemia and hypertension who is being seen 08/19/2020 for the evaluation of chest pain and shortness of breath.  Hx of Anterior STEMI 05/2019 -> 3 overlapping DES p-mLAD, 1 DES OM2, CTO of  rPDA w/ L-R collaterals - med Rx.  EF was 35 to 40% which improved to 55 to 60% on follow-up echocardiogram on medical therapy.  Due to leg fatigue he was switching Lipitor 40 mg and 80 mg on alternate days.  He was last seen by Dr. Herbie [name] April 2022.  Lipitor reduced to 40 mg daily.  Normal ABIs on follow-up study.  History of Present Illness:   [name] was in usual state of health up until Friday when started to having waxing and waning chest pain.  Each episode occurred after some sort of activity but not all the time.  It was associated with shortness of breath.  Yesterday had worse substernal chest tightness radiating to left side of chest.  This was associated with diaphoresis and shortness of breath.  He tried sublingual nitroglycerin on 3 separate occasions.  Only 1 nitro each time.  Symptoms did not improve much.  His symptoms were subsiding within 30 to 60 minutes.  He denies palpitation, orthopnea, PND, syncope, lower extremity edema or melena.  Compliant with his medication.  Yesterday he was driving to work and had mild chest discomfort leading to ER evaluation.  Currently chest pain-free.  Patient reports acid reflux type symptoms prior to his MI.  This fells similar but less intense.  Potassium 3.4 High-sensitivity troponin 5 Glucose 130 Hemoglobin 15.4 Chest x-ray without acute  abnormality  Past Medical History:  Diagnosis Date   History of acute anterior wall MI 05/29/2019   100% LAD after SP1 -> (long lesion); 90% prox OM 2 (mod LPL 60%), ~100% CTO of RPDA (high bifurcation, fills via L-R collateral flow) - 3 overlapping DES in LAD & 1 in OM2   Hyperlipidemia LDL goal <70    Multiple vessel coronary artery disease 05/29/2019   Cath 05/29/2019 : Anterior STEMI -> Severe Three-Vessel Disease:  CULPRIT LESION 100% LAD after SP1 (long); 90% pOM 2 (with mod LPL), ~100 CTO of RPDA (high bifurcation, L-R collateral); Successful DES PCI p-mLAD w/ 3 overlapping Resolute Onyx DES (3.0 x 12, 2.5 x 38, & 2.25 x 15 --> 3.65-2.8 mm), OM2 Resolute Onyx DES 2 x 12 - 2.2 mm.     Past Surgical History:  Procedure Laterality Date   CORONARY STENT INTERVENTION N/A 05/29/2019   Procedure: CORONARY STENT INTERVENTION;  Surgeon: [name] Lex, MD;  Location: [facility] INVASIVE CV LAB;  Service: Cardiovascular;; 2nd LESION: OM 2 with Resolute Onyx DES 2.0 mm x 12 mm--postdilated to 2.2 mm)   CORONARY/GRAFT ACUTE MI REVASCULARIZATION N/A 05/29/2019   Procedure: CORONARY/GRAFT ACUTE MI REVASCULARIZATION;  Surgeon: [name] Lex, MD;  Location: [facility] INVASIVE CV LAB;  Service: Cardiovascular;  Successful DES PCI of extensive proximal to mid LAD using 3 overlapping Resolute Onyx DES stents (3.0 mm x 12 mm, 2.5 mm x 38 mm, and 2.25 mm x 15 mm -> entire segment  postdilated from 3.65 mm down to 2.8 mm)    LEFT HEART CATH AND CORONARY ANGIOGRAPHY N/A 05/29/2019   Procedure: LEFT HEART CATH AND CORONARY ANGIOGRAPHY;  Surgeon: [name] LexHarding, [name] W, MD;  [facility];  Service: Cardiovascular;; CULPRIT LESION 100% LAD after SP1 (long lesion); 90% proximal OM 2 (with moderate LPL), 100% CTO of RPDA (high bifurcation, fills via L-R collaterals; EF ~35-40% mid-apical Hypo-AK. LVEDP 22 mmHg.   TRANSTHORACIC ECHOCARDIOGRAM  09/2019   EF ~ 55-60%, Mid-apical Inferoseptal HK & AK of apical and  anteroseptal wall.  Gr II DD.  Normal RV size and function.  Mild aortic sclerosis.   TRANSTHORACIC ECHOCARDIOGRAM  05/30/2019    (Anterior STEMI) --> EF 35 to 40%.  Entire anterior wall, mid and distal anterior septum and apex are akinetic.  Mild LVH, GR 1 DD.     Medications Prior to Admission: Prior to Admission medications   Medication Sig Start Date End Date Taking? Authorizing Provider  atorvastatin (LIPITOR) 40 MG tablet Take 1 tablet (40 mg total) by mouth daily. 05/25/20 08/23/20  [name] LexHarding, [name] W, MD  carvedilol (COREG) 6.25 MG tablet Take 3.125mg  (half tablet of 6.25) in the morning and 6.25mg  (whole tablet) in the evening. 05/25/20   [name] LexHarding, [name] W, MD  losartan (COZAAR) 25 MG tablet Take 0.5 tablets (12.5 mg total) by mouth daily. 06/24/19   Jodelle [name], [name] M, NP  nitroGLYCERIN (NITROSTAT) 0.4 MG SL tablet Place 1 tablet (0.4 mg total) under the tongue every 5 (five) minutes as needed for chest pain (up to 3 doses. If taking 3rd dose, call 911.). 06/01/19   [name], [name] Ruizayna N, PA-C  ticagrelor (BRILINTA) 60 MG TABS tablet Take 1 tablet (60 mg total) by mouth 2 (two) times daily. 05/25/20   [name] LexHarding, [name] W, MD     Allergies:   No Known Allergies  Social History:   Social History   Socioeconomic History   Marital status: Married    Spouse name: Not on file   Number of children: Not on file   Years of education: 12   Highest education level: High school graduate  Occupational History   Occupation: Editor, commissioningnstalls fire systems    Employer: [employer]  Tobacco Use   Smoking status: Never   Smokeless tobacco: Never  Substance and Sexual Activity   Alcohol use: Never   Drug use: Never   Sexual activity: Not on file  Other Topics Concern   Not on file  Social History Narrative   [place] in [place]      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   Social Determinants of Health   Financial Resource Strain: Not on file   Food Insecurity: Not on file  Transportation Needs: Not on file  Physical Activity: Not on file  Stress: Not on file  Social Connections: Not on file  Intimate Partner Violence: Not on file    Family History:   The patient's family history includes Hypertension in his mother.    ROS:  Please see the history of present illness.  All other ROS reviewed and negative.     Physical Exam/Data:   Vitals:   08/19/20 1601 08/19/20 1746  BP: 119/73 (!) 127/91  Pulse: 71 (!) 59  Resp: 15 17  Temp: 97.9 F (36.6 C)   TempSrc: Oral   SpO2: 98% 98%   No intake or output data in the 24  hours ending 08/19/20 1947 Last 3 Weights 06/18/2020 05/25/2020 05/14/2020  Weight (lbs) 180 lb 182 lb 182 lb 3.2 oz  Weight (kg) 81.647 kg 82.555 kg 82.645 kg     There is no height or weight on file to calculate BMI.  General:  Well nourished, well developed, in no acute distress HEENT: normal Lymph: no adenopathy Neck: no JVD Endocrine:  No thryomegaly Vascular: No carotid bruits; FA pulses 2+ bilaterally without bruits  Cardiac:  normal S1, S2; RRR; no murmur  Lungs:  clear to auscultation bilaterally, no wheezing, rhonchi or rales  Abd: soft, nontender, no hepatomegaly  Ext: no edema Musculoskeletal:  No deformities, BUE and BLE strength normal and equal Skin: warm and dry  Neuro:  CNs 2-12 intact, no focal abnormalities noted Psych:  Normal affect    EKG:  The ECG that was done  was personally reviewed and demonstrates Sinus tachycardia  Relevant CV Studies:  Echo 09/2019 1. Left ventricular ejection fraction, by estimation, is 55 to 60%. The  left ventricle has normal function. The left ventricle has no regional  wall motion abnormalities. Left ventricular diastolic parameters are  consistent with Grade II diastolic  dysfunction (pseudonormalization). There is hypokinesis of the left  ventricular, mid-apical inferoseptal wall. There is akinesis of the left  ventricular, apical anterior  wall. There is akinesis of the left  ventricular, mid anteroseptal wall. The average  left ventricular global longitudinal strain is -22.4 %. The global  longitudinal strain is normal.   2. Right ventricular systolic function is normal. The right ventricular  size is normal. There is normal pulmonary artery systolic pressure.   3. The mitral valve is normal in structure. Trivial mitral valve  regurgitation. No evidence of mitral stenosis.   4. The aortic valve is tricuspid. Aortic valve regurgitation is not  visualized. Mild aortic valve sclerosis is present, with no evidence of  aortic valve stenosis.   5. The inferior vena cava is normal in size with greater than 50%  respiratory variability, suggesting right atrial pressure of 3 mmHg.   CORONARY/GRAFT ACUTE MI REVASCULARIZATION   05/29/19  CORONARY STENT INTERVENTION  LEFT HEART CATH AND CORONARY ANGIOGRAPHY    Conclusion    CULPRIT LESION prox LAD to Dist LAD lesion is 100% stenosed. Initially, a drug-eluting stent was successfully placed using a STENT RESOLUTE ONYX 2.5X38. (Eventually postdilated to 3.5 mm proximal and 3.1 mm distal) Following initial stent placement, there was a distal edge tear focal 90% stenosis (stent had been pulled back during deployment likely related to deep inspiration. A drug-eluting stent was successfully placed through the initial stent to cover the downstream anterior, using a STENT RESOLUTE ONYX 2.25X15. The stent balloon was used to post dilate the overlapping segment. The proximal 4/5 of the stent was postdilated to 2.8 mm. A third drug-eluting stent was successfully placed overlapping proximally to cover the full lesion, using a STENT RESOLUTE ONYX 3.0X12. Postdilated to 3.6 mm Post intervention, there is a 0% residual stenosis throughout the entire 3 stented segment. Sequentially increasing noncompliant balloons sizes were used starting at 2.75 and gradually up to 3.5 mm to post dilate not just the  proximal stent, pulse the entire mid 38 mm stent. ------------------------------- 2nd Vessel lesion: 2nd Mrg-1 lesion is 90% stenosed. Plan was to proceed with PCI. A drug-eluting stent was successfully placed using a STENT RESOLUTE ONYX 2.0X12. Post intervention, there is a 0% residual stenosis. --------------------------------- 3rd Vesssel lesion: RPDA lesion is 100% stenosed with the  distal segment somewhat filled via left to right collaterals. This was felt to be CTO & the plan is to treat medically. --------------------------------- There is moderate left ventricular systolic dysfunction. The left ventricular ejection fraction is 35-45% by visual estimate. LV end diastolic pressure is moderately elevated -22 mm   Severe Three-Vessel Disease: Culprit lesion 100% LAD after SP1 (long lesion); 90% proximal OM 2 (with moderate LPL 160%), likely 100 and CTO of RPDA (high bifurcation, fills via left to right collateral flow) Successful DES PCI of extensive proximal to mid LAD using 3 overlapping Resolute Onyx DES stents (3.0 mm x 12 mm, 2.5 mm x 38 mm, and 2.25 mm x 15 mm -> entire segment postdilated from 3.65 mm down to 2.8 mm) EKG: Still denies of depressions but immediately of this of the EKG you are you can email he can successful stenting of proximal OM 2 with resolute Onyx DES 2.0 mm x 12 mm--postdilated to 2.2 mm) Moderate severely reduced EF estimated as 35 to 40% with mid to apical anterior hypo to akinesis = ISCHEMIC CARDIOMYOPATHY and ACUTE SYSTOLIC HEART FAILURE Moderate elevated LVEDP of 22 mm.  ACUTE DIASTOLIC HEART FAILURE     RECOMMENDATIONS: Admit to CVICU, continue Aggrastat until current bottle complete.   Continue to treat occluded (CTO) of RPDA medically for now. Would wait until 4/15 or 4/16 to check 2D echo. Borderline pressures in the Cath Lab -> recommend low-dose beta-blocker plus or minus ARB if possible to start prior to discharge. Monitor for signs of orthopnea,  low threshold to consider IV diuresis given LVEDP of 22 mmHg.   Given the size of his infarct, and two-vessel PCI, would probably not consider fast-track candidate.   Laboratory Data:  High Sensitivity Troponin:   Recent Labs  Lab 08/19/20 1709  TROPONINIHS 5      Chemistry Recent Labs  Lab 08/19/20 1709  NA 136  K 3.4*  CL 104  CO2 24  GLUCOSE 130*  BUN 11  CREATININE 1.01  CALCIUM 9.2  GFRNONAA >60  ANIONGAP 8     Hematology Recent Labs  Lab 08/19/20 1709  WBC 7.7  RBC 4.94  HGB 15.4  HCT 44.2  MCV 89.5  MCH 31.2  MCHC 34.8  RDW 11.8  PLT 255   Radiology/Studies:  DG Chest 2 View  Result Date: 08/19/2020 CLINICAL DATA:  Chest pain and shortness of breath for 5 days EXAM: CHEST - 2 VIEW COMPARISON:  09/23/2019 FINDINGS: The heart size and mediastinal contours are within normal limits. Both lungs are clear. The visualized skeletal structures are unremarkable. IMPRESSION: No active cardiopulmonary disease. Electronically Signed   By: Signa Kell M.D.   On: 08/19/2020 18:04     Assessment and Plan:   Unstable angina -Waxing and waning chest pain with shortness of breath since Friday.  Each episode occurred after some sort of activity but not all the time.  He used sublingual nitroglycerin but only 1 each time without resolution of his symptoms.  His symptoms were lasting for 30 to 60 minutes.  Had a worse episode yesterday.  Currently chest pain-free.  Troponin negative.  EKG without acute ischemic changes. -Admit and cycle troponin -If recurrent pain or elevated enzymes start heparin -Add Imdur 30 mg daily -N.p.o. after midnight for possible cath tomorrow -Continue home Brilinta 60 mg twice daily -Add aspirin 81 mg daily -Continue Lipitor 40 mg daily -Continue home carvedilol and losartan  2.  Ischemic cardiomyopathy -Normalized LV function by last echocardiogram  in 2021 -Euvolemic by exam -Continue Coreg and losartan  3.  Hyperlipidemia -Leg  fatigue on higher dose of Lipitor -Continue Lipitor 40 mg for now 05/14/2020: Cholesterol 115; HDL 33.50; LDL Cholesterol 62; Triglycerides 101.0; VLDL 20.2    Risk Assessment/Risk Scores:   TIMI Risk Score for Unstable Angina or Non-ST Elevation MI:   The patient's TIMI risk score is 4, which indicates a 20% risk of all cause mortality, new or recurrent myocardial infarction or need for urgent revascularization in the next 14 days.{   Severity of Illness: The appropriate patient status for this patient is OBSERVATION. Observation status is judged to be reasonable and necessary in order to provide the required intensity of service to ensure the patient's safety. The patient's presenting symptoms, physical exam findings, and initial radiographic and laboratory data in the context of their medical condition is felt to place them at decreased risk for further clinical deterioration. Furthermore, it is anticipated that the patient will be medically stable for discharge from the hospital within 2 midnights of admission. The following factors support the patient status of observation.   " The patient's presenting symptoms include chest pain and shortness of breath " The physical exam findings include nonapplicable " The initial radiographic and laboratory data are stable  For questions or updates, please contact [facility] Please consult [email or web address] for contact info under     Lorelei Pont, [place]  08/19/2020 7:47 PM

## 2020-08-19 NOTE — ED Triage Notes (Signed)
Pt here from  home with c/o cp since then weekend  with some slight sob no nausea , pt has 4 stents ,

## 2020-08-19 NOTE — ED Provider Notes (Signed)
Emergency Medicine Provider Triage Evaluation Note  [name] [name] , a 55 y.o. male  was evaluated in triage.  Pt complains of chest pain.  Patient has been having intermittent chest pain since Friday.  Chest pain is worse with exertion.  Patient endorses associated shortness of breath.  Patient reports that his chest pain started at 0 830 this morning.  Chest pain has been constant since then.  Chest pain is midsternal and occasionally radiates to left side of his chest.  Patient denies any nausea, vomiting, abdominal pain, diaphoresis, syncope.  Review of Systems  Positive: Chest pain, shob Negative: nausea, vomiting, abdominal pain, diaphoresis, syncope, leg swelling  Physical Exam  BP 119/73 (BP Location: Right Arm)   Pulse 71   Temp 97.9 F (36.6 C) (Oral)   Resp 15   SpO2 98%  Gen:   Awake, no distress   Resp:  Normal effort, lungs clear to auscultation bilaterally MSK:   Moves extremities without difficulty Other:  +2 radial pulse bilaterally.  No swelling, tenderness, or edema to bilateral lower extremities.  Medical Decision Making  Medically screening exam initiated at 5:18 PM.  Appropriate orders placed.  [name] [name] was informed that the remainder of the evaluation will be completed by another provider, this initial triage assessment does not replace that evaluation, and the importance of remaining in the ED until their evaluation is complete.  The patient appears stable so that the remainder of the work up may be completed by another provider.      Haskel Schroeder, PA-C 08/19/20 1719    [name], [name] Seal, DO 08/20/20 0011

## 2020-08-19 NOTE — ED Provider Notes (Signed)
[facility] EMERGENCY DEPARTMENT Provider Note   CSN: [id number] Arrival date & time: 08/19/20  1550     History No chief complaint on file.   [name] [name] is a 55 y.o. male.  Pt presents to the ED today with cp.  He has a hx of a STEMI involving the LAD on 05/29/19.  He is followed by Dr. Herbie Baltimore.  He said he has been having intermittent cp since Friday, July 1st.  Pt said he's had cp all day.  It was worsened by walking across the parking lot.  He had to stop and the pain went away.  Pt denies any pain now.       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     Patient Active Problem List   Diagnosis Date Noted   Unstable angina (HCC) 08/19/2020   Leg fatigue 05/25/2020   Snores 05/14/2020   Inclusion cyst 05/14/2020   Right knee pain 05/14/2020   Healthcare maintenance 05/14/2020   Elevated transaminase level 09/26/2019   Leukocytosis 06/01/2019   STEMI involving left anterior descending coronary artery (HCC) 05/29/2019   Acute combined systolic and diastolic heart failure (HCC) 05/29/2019   Ischemic cardiomyopathy 05/29/2019   Essential hypertension 05/29/2019   Hyperlipidemia LDL goal <70    Coronary artery disease involving native coronary artery of native heart without angina pectoris     Past Surgical History:   Procedure Laterality Date   CORONARY STENT INTERVENTION N/A 05/29/2019   Procedure: CORONARY STENT INTERVENTION;  Surgeon: Marykay Lex, MD;  Location: [facility] INVASIVE CV LAB;  Service: Cardiovascular;; 2nd LESION: OM 2 with Resolute Onyx DES 2.0 mm x 12 mm--postdilated to 2.2 mm)   CORONARY/GRAFT ACUTE MI REVASCULARIZATION N/A 05/29/2019   Procedure: CORONARY/GRAFT ACUTE MI REVASCULARIZATION;  Surgeon: Marykay Lex, MD;  Location: [facility] INVASIVE CV LAB;  Service: Cardiovascular;  Successful DES PCI of extensive proximal to mid LAD using 3 overlapping Resolute Onyx DES stents (3.0 mm x 12 mm, 2.5 mm x 38 mm, and 2.25 mm x 15 mm -> entire segment postdilated from 3.65 mm down to 2.8 mm)    LEFT HEART CATH AND CORONARY ANGIOGRAPHY N/A 05/29/2019   Procedure: LEFT HEART CATH AND CORONARY ANGIOGRAPHY;  Surgeon: Marykay Lex, MD;  Location: [facility] INVASIVE CV LAB;  Service: Cardiovascular;; CULPRIT LESION 100% LAD after SP1 (long lesion); 90% proximal OM 2 (with moderate LPL), 100% CTO of RPDA (high bifurcation, fills via L-R collaterals; EF ~35-40% mid-apical Hypo-AK. LVEDP 22 mmHg.   TRANSTHORACIC ECHOCARDIOGRAM  09/2019   EF ~ 55-60%, Mid-apical Inferoseptal HK & AK of apical and anteroseptal wall.  Gr II DD.  Normal RV size and function.  Mild aortic sclerosis.   TRANSTHORACIC ECHOCARDIOGRAM  05/30/2019    (  Anterior STEMI) --> EF 35 to 40%.  Entire anterior wall, mid and distal anterior septum and apex are akinetic.  Mild LVH, GR 1 DD.       Family History  Problem Relation Age of Onset   Hypertension Mother     Social History   Tobacco Use   Smoking status: Never   Smokeless tobacco: Never  Substance Use Topics   Alcohol use: Never   Drug use: Never    Home Medications Prior to Admission medications   Medication Sig Start Date End Date Taking? Authorizing Provider  atorvastatin (LIPITOR) 40 MG tablet Take 1 tablet (40 mg total) by mouth daily. 05/25/20 08/23/20  Marykay Lex, MD   carvedilol (COREG) 6.25 MG tablet Take 3.125mg  (half tablet of 6.25) in the morning and 6.25mg  (whole tablet) in the evening. 05/25/20   Marykay Lex, MD  losartan (COZAAR) 25 MG tablet Take 0.5 tablets (12.5 mg total) by mouth daily. 06/24/19   Jodelle Gross, NP  nitroGLYCERIN (NITROSTAT) 0.4 MG SL tablet Place 1 tablet (0.4 mg total) under the tongue every 5 (five) minutes as needed for chest pain (up to 3 doses. If taking 3rd dose, call 911.). 06/01/19   Dunn, Tacey Ruiz, PA-C  ticagrelor (BRILINTA) 60 MG TABS tablet Take 1 tablet (60 mg total) by mouth 2 (two) times daily. 05/25/20   Marykay Lex, MD    Allergies    Patient has no known allergies.  Review of Systems   Review of Systems  Cardiovascular:  Positive for chest pain.  All other systems reviewed and are negative.  Physical Exam Updated Vital Signs BP (!) 146/99   Pulse (!) 55   Temp 97.9 F (36.6 C) (Oral)   Resp 16   SpO2 99%   Physical Exam Vitals and nursing note reviewed.  Constitutional:      Appearance: Normal appearance.  HENT:     Head: Normocephalic and atraumatic.     Right Ear: External ear normal.     Left Ear: External ear normal.     Nose: Nose normal.     Mouth/Throat:     Mouth: Mucous membranes are moist.     Pharynx: Oropharynx is clear.  Eyes:     Extraocular Movements: Extraocular movements intact.     Conjunctiva/sclera: Conjunctivae normal.     Pupils: Pupils are equal, round, and reactive to light.  Cardiovascular:     Rate and Rhythm: Normal rate and regular rhythm.     Pulses: Normal pulses.     Heart sounds: Normal heart sounds.  Pulmonary:     Effort: Pulmonary effort is normal.     Breath sounds: Normal breath sounds.  Abdominal:     General: Abdomen is flat. Bowel sounds are normal.     Palpations: Abdomen is soft.  Musculoskeletal:        General: Normal range of motion.     Cervical back: Normal range of motion and neck supple.  Skin:    General: Skin is warm.      Capillary Refill: Capillary refill takes less than 2 seconds.  Neurological:     General: No focal deficit present.     Mental Status: He is alert and oriented to person, place, and time.  Psychiatric:        Mood and Affect: Mood normal.        Behavior: Behavior normal.        Thought Content: Thought content normal.  Judgment: Judgment normal.    ED Results / Procedures / Treatments   Labs (all labs ordered are listed, but only abnormal results are displayed) Labs Reviewed  BASIC METABOLIC PANEL - Abnormal; Notable for the following components:      Result Value   Potassium 3.4 (*)    Glucose, Bld 130 (*)    All other components within normal limits  RESP PANEL BY RT-PCR (FLU A&B, COVID) ARPGX2  CBC  TROPONIN I (HIGH SENSITIVITY)  TROPONIN I (HIGH SENSITIVITY)    EKG EKG Interpretation  Date/Time:  Wednesday August 19 2020 17:01:42 EDT Ventricular Rate:  58 PR Interval:  126 QRS Duration: 80 QT Interval:  426 QTC Calculation: 418 R Axis:   53 Text Interpretation: Sinus bradycardia Septal infarct , age undetermined Abnormal ECG No significant change since last tracing Confirmed by Jacalyn Lefevre [phone]) on 08/19/2020 5:50:23 PM  Radiology DG Chest 2 View  Result Date: 08/19/2020 CLINICAL DATA:  Chest pain and shortness of breath for 5 days EXAM: CHEST - 2 VIEW COMPARISON:  09/23/2019 FINDINGS: The heart size and mediastinal contours are within normal limits. Both lungs are clear. The visualized skeletal structures are unremarkable. IMPRESSION: No active cardiopulmonary disease. Electronically Signed   By: Signa Kell M.D.   On: 08/19/2020 18:04    Procedures Procedures   Medications Ordered in ED Medications - No data to display  ED Course  I have reviewed the triage vital signs and the nursing notes.  Pertinent labs & imaging results that were available during my care of the patient were reviewed by me and considered in my medical decision making (see  chart for details).    MDM Rules/Calculators/A&P                          Pt d/w Dr. Rennis Golden (cards).  He will admit pt for observation and cycle troponins.  Final Clinical Impression(s) / ED Diagnoses Final diagnoses:  Unstable angina [facility])    Rx / DC Orders ED Discharge Orders     None        Jacalyn Lefevre, MD 08/19/20 2016

## 2020-08-19 NOTE — ED Notes (Signed)
Chest pain since earlier today no chest pain at present  Admitting doctor just left  He wants the pt to get a meal before midnight wil order with order

## 2020-08-20 ENCOUNTER — Encounter (HOSPITAL_COMMUNITY)
Admission: EM | Disposition: A | Discharge status: Home / Self Care | Payer: Self-pay | Source: Home / Self Care | Attending: Emergency Medicine

## 2020-08-20 ENCOUNTER — Encounter (HOSPITAL_COMMUNITY): Payer: Self-pay | Admitting: Cardiology

## 2020-08-20 ENCOUNTER — Other Ambulatory Visit: Payer: Self-pay

## 2020-08-20 DIAGNOSIS — I25110 Atherosclerotic heart disease of native coronary artery with unstable angina pectoris: Secondary | ICD-10-CM | POA: Diagnosis not present

## 2020-08-20 DIAGNOSIS — Z20822 Contact with and (suspected) exposure to covid-19: Secondary | ICD-10-CM | POA: Diagnosis not present

## 2020-08-20 DIAGNOSIS — I5041 Acute combined systolic (congestive) and diastolic (congestive) heart failure: Secondary | ICD-10-CM | POA: Diagnosis not present

## 2020-08-20 DIAGNOSIS — I110 Hypertensive heart disease with heart failure: Secondary | ICD-10-CM | POA: Diagnosis not present

## 2020-08-20 HISTORY — PX: LEFT HEART CATH AND CORONARY ANGIOGRAPHY: CATH118249

## 2020-08-20 LAB — CBC
HCT: 46.5 % (ref 39.0–52.0)
Hemoglobin: 15.8 g/dL (ref 13.0–17.0)
MCH: 30.6 pg (ref 26.0–34.0)
MCHC: 34 g/dL (ref 30.0–36.0)
MCV: 90.1 fL (ref 80.0–100.0)
Platelets: 245 10*3/uL (ref 150–400)
RBC: 5.16 MIL/uL (ref 4.22–5.81)
RDW: 11.9 % (ref 11.5–15.5)
WBC: 6.9 10*3/uL (ref 4.0–10.5)
nRBC: 0 % (ref 0.0–0.2)

## 2020-08-20 LAB — BASIC METABOLIC PANEL
Anion gap: 7 (ref 5–15)
BUN: 13 mg/dL (ref 6–20)
CO2: 28 mmol/L (ref 22–32)
Calcium: 9.5 mg/dL (ref 8.9–10.3)
Chloride: 106 mmol/L (ref 98–111)
Creatinine, Ser: 1.02 mg/dL (ref 0.61–1.24)
GFR, Estimated: >60 mL/min (ref >60)
Glucose, Bld: 90 mg/dL (ref 70–99)
Potassium: 4.3 mmol/L (ref 3.5–5.1)
Sodium: 141 mmol/L (ref 135–145)

## 2020-08-20 LAB — LIPID PANEL
Cholesterol: 132 mg/dL (ref 0–200)
HDL: 29 mg/dL — ABNORMAL LOW (ref >40)
LDL Cholesterol: 85 mg/dL (ref 0–99)
Total CHOL/HDL Ratio: 4.6 RATIO
Triglycerides: 92 mg/dL (ref <150)
VLDL: 18 mg/dL (ref 0–40)

## 2020-08-20 LAB — RESP PANEL BY RT-PCR (FLU A&B, COVID) ARPGX2
Influenza A by PCR: NEGATIVE
Influenza B by PCR: NEGATIVE
SARS Coronavirus 2 by RT PCR: NEGATIVE

## 2020-08-20 LAB — HEMOGLOBIN A1C
Hgb A1c MFr Bld: 5.6 % (ref 4.8–5.6)
Mean Plasma Glucose: 114.02 mg/dL

## 2020-08-20 LAB — HIV ANTIBODY (ROUTINE TESTING W REFLEX): HIV Screen 4th Generation wRfx: NONREACTIVE

## 2020-08-20 LAB — TROPONIN I (HIGH SENSITIVITY)
Troponin I (High Sensitivity): 6 ng/L (ref <18)
Troponin I (High Sensitivity): 5 ng/L (ref <18)

## 2020-08-20 SURGERY — LEFT HEART CATH AND CORONARY ANGIOGRAPHY
Anesthesia: LOCAL

## 2020-08-20 MED ORDER — VERAPAMIL HCL 2.5 MG/ML IV SOLN
INTRAVENOUS | Status: AC
  Filled 2020-08-20: qty 2

## 2020-08-20 MED ORDER — LIDOCAINE HCL (PF) 1 % IJ SOLN
INTRAMUSCULAR | Status: AC
  Filled 2020-08-20: qty 30

## 2020-08-20 MED ORDER — SODIUM CHLORIDE 0.9% FLUSH
3.0000 mL | Freq: Two times a day (BID) | INTRAVENOUS | Status: DC
  Administered 2020-08-21: 3 mL via INTRAVENOUS

## 2020-08-20 MED ORDER — IOHEXOL 350 MG/ML SOLN
INTRAVENOUS | Status: DC | PRN
  Administered 2020-08-20: 68 mL

## 2020-08-20 MED ORDER — ASPIRIN EC 81 MG PO TBEC
81.0000 mg | DELAYED_RELEASE_TABLET | Freq: Every day | ORAL | Status: DC
  Administered 2020-08-20 – 2020-08-21 (×2): 81 mg via ORAL
  Filled 2020-08-20 (×2): qty 1

## 2020-08-20 MED ORDER — HEPARIN (PORCINE) IN NACL 2-0.9 UNITS/ML
INTRAMUSCULAR | Status: DC | PRN
  Administered 2020-08-20: 10 mL via INTRA_ARTERIAL

## 2020-08-20 MED ORDER — MIDAZOLAM HCL 2 MG/2ML IJ SOLN
INTRAMUSCULAR | Status: AC
  Filled 2020-08-20: qty 2

## 2020-08-20 MED ORDER — SODIUM CHLORIDE 0.9% FLUSH
3.0000 mL | Freq: Two times a day (BID) | INTRAVENOUS | Status: DC
  Administered 2020-08-20: 3 mL via INTRAVENOUS

## 2020-08-20 MED ORDER — TICAGRELOR 60 MG PO TABS
60.0000 mg | ORAL_TABLET | Freq: Two times a day (BID) | ORAL | Status: DC
  Administered 2020-08-20 – 2020-08-21 (×3): 60 mg via ORAL
  Filled 2020-08-20 (×3): qty 1

## 2020-08-20 MED ORDER — NITROGLYCERIN 0.4 MG SL SUBL: 0.4000 mg | SUBLINGUAL_TABLET | SUBLINGUAL | Status: DC | PRN

## 2020-08-20 MED ORDER — HEPARIN SODIUM (PORCINE) 5000 UNIT/ML IJ SOLN
5000.0000 [IU] | Freq: Three times a day (TID) | INTRAMUSCULAR | Status: DC
  Administered 2020-08-20: 5000 [IU] via SUBCUTANEOUS

## 2020-08-20 MED ORDER — HEPARIN SODIUM (PORCINE) 1000 UNIT/ML IJ SOLN
INTRAMUSCULAR | Status: AC
  Filled 2020-08-20: qty 1

## 2020-08-20 MED ORDER — HEPARIN (PORCINE) IN NACL 1000-0.9 UT/500ML-% IV SOLN
INTRAVENOUS | Status: AC
  Filled 2020-08-20: qty 1000

## 2020-08-20 MED ORDER — LOSARTAN POTASSIUM 25 MG PO TABS
12.5000 mg | ORAL_TABLET | Freq: Every day | ORAL | Status: DC
  Administered 2020-08-20 – 2020-08-21 (×2): 12.5 mg via ORAL
  Filled 2020-08-20 (×2): qty 1

## 2020-08-20 MED ORDER — MIDAZOLAM HCL 2 MG/2ML IJ SOLN
INTRAMUSCULAR | Status: DC | PRN
  Administered 2020-08-20: 1 mg via INTRAVENOUS

## 2020-08-20 MED ORDER — ATORVASTATIN CALCIUM 40 MG PO TABS
40.0000 mg | ORAL_TABLET | Freq: Every day | ORAL | Status: DC
  Administered 2020-08-20: 40 mg via ORAL
  Filled 2020-08-20: qty 1

## 2020-08-20 MED ORDER — SODIUM CHLORIDE 0.9% FLUSH: 3.0000 mL | INTRAVENOUS | Status: DC | PRN

## 2020-08-20 MED ORDER — HYDRALAZINE HCL 20 MG/ML IJ SOLN: 10.0000 mg | INTRAMUSCULAR | Status: AC | PRN

## 2020-08-20 MED ORDER — SODIUM CHLORIDE 0.9 % IV SOLN: INTRAVENOUS | Status: AC

## 2020-08-20 MED ORDER — LIDOCAINE HCL (PF) 1 % IJ SOLN
INTRAMUSCULAR | Status: DC | PRN
  Administered 2020-08-20: 4 mL

## 2020-08-20 MED ORDER — LABETALOL HCL 5 MG/ML IV SOLN: 10.0000 mg | INTRAVENOUS | Status: AC | PRN

## 2020-08-20 MED ORDER — SODIUM CHLORIDE 0.9 % WEIGHT BASED INFUSION
3.0000 mL/kg/h | INTRAVENOUS | Status: DC
  Administered 2020-08-20: 3 mL/kg/h via INTRAVENOUS

## 2020-08-20 MED ORDER — ACETAMINOPHEN 325 MG PO TABS
650.0000 mg | ORAL_TABLET | ORAL | Status: DC | PRN
  Administered 2020-08-20: 650 mg via ORAL
  Filled 2020-08-20: qty 2

## 2020-08-20 MED ORDER — SODIUM CHLORIDE 0.9 % IV SOLN: 250.0000 mL | INTRAVENOUS | Status: DC | PRN

## 2020-08-20 MED ORDER — HEPARIN SODIUM (PORCINE) 1000 UNIT/ML IJ SOLN
INTRAMUSCULAR | Status: DC | PRN
  Administered 2020-08-20: 4000 [IU] via INTRAVENOUS

## 2020-08-20 MED ORDER — CARVEDILOL 6.25 MG PO TABS
6.2500 mg | ORAL_TABLET | Freq: Two times a day (BID) | ORAL | Status: DC
  Administered 2020-08-20 – 2020-08-21 (×3): 6.25 mg via ORAL
  Filled 2020-08-20 (×3): qty 1

## 2020-08-20 MED ORDER — FENTANYL CITRATE (PF) 100 MCG/2ML IJ SOLN
INTRAMUSCULAR | Status: DC | PRN
  Administered 2020-08-20: 25 ug via INTRAVENOUS

## 2020-08-20 MED ORDER — ONDANSETRON HCL 4 MG/2ML IJ SOLN: 4.0000 mg | Freq: Four times a day (QID) | INTRAMUSCULAR | Status: DC | PRN

## 2020-08-20 MED ORDER — SODIUM CHLORIDE 0.9 % WEIGHT BASED INFUSION
1.0000 mL/kg/h | INTRAVENOUS | Status: DC
  Administered 2020-08-20: 1 mL/kg/h via INTRAVENOUS

## 2020-08-20 MED ORDER — FENTANYL CITRATE (PF) 100 MCG/2ML IJ SOLN
INTRAMUSCULAR | Status: AC
  Filled 2020-08-20: qty 2

## 2020-08-20 MED ORDER — HEPARIN (PORCINE) IN NACL 1000-0.9 UT/500ML-% IV SOLN
INTRAVENOUS | Status: DC | PRN
  Administered 2020-08-20 (×2): 500 mL

## 2020-08-20 SURGICAL SUPPLY — 11 items
CATH INFINITI 5FR ANG PIGTAIL (CATHETERS) ×1 IMPLANT
CATH OPTITORQUE TIG 4.0 5F (CATHETERS) ×1 IMPLANT
DEVICE RAD COMP TR BAND LRG (VASCULAR PRODUCTS) ×1 IMPLANT
GLIDESHEATH SLEND SS 6F .021 (SHEATH) ×1 IMPLANT
KIT HEART LEFT (KITS) ×2 IMPLANT
PACK CARDIAC CATHETERIZATION (CUSTOM PROCEDURE TRAY) ×2 IMPLANT
SHEATH PROBE COVER 6X72 (BAG) ×1 IMPLANT
TRANSDUCER W/STOPCOCK (MISCELLANEOUS) ×2 IMPLANT
TUBING CIL FLEX 10 FLL-RA (TUBING) ×2 IMPLANT
WIRE EMERALD 3MM-J .035X150CM (WIRE) ×1 IMPLANT
WIRE HI TORQ VERSACORE J 260CM (WIRE) ×1 IMPLANT

## 2020-08-20 NOTE — ED Notes (Signed)
No chest pain

## 2020-08-20 NOTE — ED Notes (Signed)
Report called to shenelle rn

## 2020-08-20 NOTE — Plan of Care (Signed)

## 2020-08-20 NOTE — H&P (View-Only) (Signed)
 Progress Note  Patient Name: [name] [name] Date of Encounter: 08/20/2020  [facility] Cardiologist: David Harding, MD   Subjective   No chest pain this morning.   Inpatient Medications    Scheduled Meds:  aspirin EC  81 mg Oral Daily   atorvastatin  40 mg Oral Daily   carvedilol  6.25 mg Oral BID WC   heparin  5,000 Units Subcutaneous Q8H   losartan  12.5 mg Oral Daily   ticagrelor  60 mg Oral BID   Continuous Infusions:  PRN Meds: acetaminophen, nitroGLYCERIN, ondansetron (ZOFRAN) IV   Vital Signs    Vitals:   08/20/20 0445 08/20/20 0526 08/20/20 0628 08/20/20 0819  BP: 115/80  123/88 129/89  Pulse: (!) 59  (!) 59 62  Resp: 11  16   Temp:  97.9 F (36.6 C) 97.6 F (36.4 C) 97.6 F (36.4 C)  TempSrc:  Oral Oral Oral  SpO2: 95%   96%  Weight:   80.3 kg   Height:   5' 7" (1.702 m)    No intake or output data in the 24 hours ending 08/20/20 0853 Last 3 Weights 08/20/2020 06/18/2020 05/25/2020  Weight (lbs) 177 lb 1.6 oz 180 lb 182 lb  Weight (kg) 80.332 kg 81.647 kg 82.555 kg      Telemetry    SR - Personally Reviewed  ECG    No new tracing this morning.  Physical Exam   GEN: No acute distress.   Neck: No JVD Cardiac: RRR, no murmurs, rubs, or gallops.  Respiratory: Clear to auscultation bilaterally. GI: Soft, nontender, non-distended  MS: No edema; No deformity. Neuro:  Nonfocal  Psych: Normal affect   Labs    High Sensitivity Troponin:   Recent Labs  Lab 08/19/20 1709 08/19/20 1909  TROPONINIHS 5 5      Chemistry Recent Labs  Lab 08/19/20 1709  NA 136  K 3.4*  CL 104  CO2 24  GLUCOSE 130*  BUN 11  CREATININE 1.01  CALCIUM 9.2  GFRNONAA >60  ANIONGAP 8     Hematology Recent Labs  Lab 08/19/20 1709  WBC 7.7  RBC 4.94  HGB 15.4  HCT 44.2  MCV 89.5  MCH 31.2  MCHC 34.8  RDW 11.8  PLT 255    BNPNo results for input(s): BNP, PROBNP in the last 168 hours.   DDimer No results for input(s): DDIMER in the last  168 hours.   Radiology    DG Chest 2 View  Result Date: 08/19/2020 CLINICAL DATA:  Chest pain and shortness of breath for 5 days EXAM: CHEST - 2 VIEW COMPARISON:  09/23/2019 FINDINGS: The heart size and mediastinal contours are within normal limits. Both lungs are clear. The visualized skeletal structures are unremarkable. IMPRESSION: No active cardiopulmonary disease. Electronically Signed   By: Taylor  Stroud M.D.   On: 08/19/2020 18:04    Cardiac Studies   N/a   Patient Profile     55 y.o. male with CAD s/p NSTEMI 05/2019 DESx3 LAD, DESx1 OM2 CTO of rPDA w/ L-R collaterals, ischemic cardiomyopathy with improved LV function, hyperlipidemia and hypertension who is being seen 08/19/2020 for the evaluation of chest pain and shortness of breath.   Assessment & Plan    Unstable angina with hx of CAD: Waxing and waning chest pain with shortness of breath since Friday.  Each episode occurred after some sort of activity but not all the time.  He used sublingual nitroglycerin but only 1 each time   without resolution of his symptoms.  His symptoms were lasting for 30 to 60 minutes. Troponin negative.  EKG without acute ischemic changes. -- no chest pain overnight -- continue ASA, Brilinta, statin, BB, ARB -- planned for cardiac cath today   Ischemic cardiomyopathy: Normalized LV function by last echocardiogram in 2021 -- remains volume stable on exam -- Continue Coreg and losartan   Hyperlipidemia: Reported leg fatigue on higher dose statin -- Continue Lipitor 40 mg for now  Hypokalemia: K + 3.4 yesterday -- BMET pending  Hypertension: stable with current regimen of coreg 6.25mg  BID and losartan 12.5mg  daily   For questions or updates, please contact [facility] Please consult [email or web address] for contact info under   Signed, Laverda Page, NP  08/20/2020, 8:53 AM    I have personally seen and examined this patient. I agree with the assessment and plan as outlined above. No chest pain  this am. Troponin negative. Plans for cardiac cath today.   Verne Carrow 08/20/2020 10:08 AM

## 2020-08-20 NOTE — Progress Notes (Addendum)
Progress Note  Patient Name: [name] [name] Date of Encounter: 08/20/2020  [facility] Cardiologist: [name], MD   Subjective   No chest pain this morning.   Inpatient Medications    Scheduled Meds:  aspirin EC  81 mg Oral Daily   atorvastatin  40 mg Oral Daily   carvedilol  6.25 mg Oral BID WC   heparin  5,000 Units Subcutaneous Q8H   losartan  12.5 mg Oral Daily   ticagrelor  60 mg Oral BID   Continuous Infusions:  PRN Meds: acetaminophen, nitroGLYCERIN, ondansetron (ZOFRAN) IV   Vital Signs    Vitals:   08/20/20 0445 08/20/20 0526 08/20/20 0628 08/20/20 0819  BP: 115/80  123/88 129/89  Pulse: (!) 59  (!) 59 62  Resp: 11  16   Temp:  97.9 F (36.6 C) 97.6 F (36.4 C) 97.6 F (36.4 C)  TempSrc:  Oral Oral Oral  SpO2: 95%   96%  Weight:   80.3 kg   Height:   5\' 7"  (1.702 m)    No intake or output data in the 24 hours ending 08/20/20 0853 Last 3 Weights 08/20/2020 06/18/2020 05/25/2020  Weight (lbs) 177 lb 1.6 oz 180 lb 182 lb  Weight (kg) 80.332 kg 81.647 kg 82.555 kg      Telemetry    SR - Personally Reviewed  ECG    No new tracing this morning.  Physical Exam   GEN: No acute distress.   Neck: No JVD Cardiac: RRR, no murmurs, rubs, or gallops.  Respiratory: Clear to auscultation bilaterally. GI: Soft, nontender, non-distended  MS: No edema; No deformity. Neuro:  Nonfocal  Psych: Normal affect   Labs    High Sensitivity Troponin:   Recent Labs  Lab 08/19/20 1709 08/19/20 1909  TROPONINIHS 5 5      Chemistry Recent Labs  Lab 08/19/20 1709  NA 136  K 3.4*  CL 104  CO2 24  GLUCOSE 130*  BUN 11  CREATININE 1.01  CALCIUM 9.2  GFRNONAA >60  ANIONGAP 8     Hematology Recent Labs  Lab 08/19/20 1709  WBC 7.7  RBC 4.94  HGB 15.4  HCT 44.2  MCV 89.5  MCH 31.2  MCHC 34.8  RDW 11.8  PLT 255    BNPNo results for input(s): BNP, PROBNP in the last 168 hours.   DDimer No results for input(s): DDIMER in the last  168 hours.   Radiology    DG Chest 2 View  Result Date: 08/19/2020 CLINICAL DATA:  Chest pain and shortness of breath for 5 days EXAM: CHEST - 2 VIEW COMPARISON:  09/23/2019 FINDINGS: The heart size and mediastinal contours are within normal limits. Both lungs are clear. The visualized skeletal structures are unremarkable. IMPRESSION: No active cardiopulmonary disease. Electronically Signed   By: 11/23/2019 M.D.   On: 08/19/2020 18:04    Cardiac Studies   N/a   Patient Profile     55 y.o. male with CAD s/p NSTEMI 05/2019 DESx3 LAD, DESx1 OM2 CTO of rPDA w/ L-R collaterals, ischemic cardiomyopathy with improved LV function, hyperlipidemia and hypertension who is being seen 08/19/2020 for the evaluation of chest pain and shortness of breath.   Assessment & Plan    Unstable angina with hx of CAD: Waxing and waning chest pain with shortness of breath since Friday.  Each episode occurred after some sort of activity but not all the time.  He used sublingual nitroglycerin but only 1 each time  without resolution of his symptoms.  His symptoms were lasting for 30 to 60 minutes. Troponin negative.  EKG without acute ischemic changes. -- no chest pain overnight -- continue ASA, Brilinta, statin, BB, ARB -- planned for cardiac cath today   Ischemic cardiomyopathy: Normalized LV function by last echocardiogram in 2021 -- remains volume stable on exam -- Continue Coreg and losartan   Hyperlipidemia: Reported leg fatigue on higher dose statin -- Continue Lipitor 40 mg for now  Hypokalemia: K + 3.4 yesterday -- BMET pending  Hypertension: stable with current regimen of coreg 6.25mg  BID and losartan 12.5mg  daily   For questions or updates, please contact [facility] Please consult [email or web address] for contact info under   Signed, Laverda Page, NP  08/20/2020, 8:53 AM    I have personally seen and examined this patient. I agree with the assessment and plan as outlined above. No chest pain  this am. Troponin negative. Plans for cardiac cath today.   Verne Carrow 08/20/2020 10:08 AM

## 2020-08-20 NOTE — ED Notes (Signed)
An unsuccessful attempt to call report 

## 2020-08-20 NOTE — Interval H&P Note (Signed)
History and Physical Interval Note:  08/20/2020 12:32 PM  [name] [name]  has presented today for surgery, with the diagnosis of chest pain.  The various methods of treatment have been discussed with the patient and family. After consideration of risks, benefits and other options for treatment, the patient has consented to  Procedure(s): LEFT HEART CATH AND CORONARY ANGIOGRAPHY (N/A)  PERCUTANEOUS CORONARY INTERVENTION   as a surgical intervention.  The patient's history has been reviewed, patient examined, no change in status, stable for surgery.  I have reviewed the patient's chart and labs.  Questions were answered to the patient's satisfaction.    Cath Lab Visit (complete for each Cath Lab visit)  Clinical Evaluation Leading to the Procedure:   ACS: Yes.    Non-ACS:    Anginal Classification: CCS III  Anti-ischemic medical therapy: Minimal Therapy (1 class of medications)  Non-Invasive Test Results: No non-invasive testing performed  Prior CABG: No previous CABG    [name] [name]

## 2020-08-21 ENCOUNTER — Other Ambulatory Visit (HOSPITAL_COMMUNITY): Payer: Self-pay

## 2020-08-21 DIAGNOSIS — I200 Unstable angina: Secondary | ICD-10-CM | POA: Diagnosis not present

## 2020-08-21 MED ORDER — ATORVASTATIN CALCIUM 80 MG PO TABS: 80.0000 mg | ORAL_TABLET | Freq: Every day | ORAL | Status: DC

## 2020-08-21 MED ORDER — ISOSORBIDE MONONITRATE ER 30 MG PO TB24
15.0000 mg | ORAL_TABLET | Freq: Every day | ORAL | Status: DC
  Administered 2020-08-21: 15 mg via ORAL
  Filled 2020-08-21: qty 1

## 2020-08-21 MED ORDER — ISOSORBIDE MONONITRATE ER 30 MG PO TB24
15.0000 mg | ORAL_TABLET | Freq: Every day | ORAL | 0 refills | Status: DC
  Filled 2020-08-21: qty 15, 30d supply, fill #0

## 2020-08-21 MED ORDER — ATORVASTATIN CALCIUM 40 MG PO TABS
40.0000 mg | ORAL_TABLET | Freq: Every day | ORAL | Status: DC
  Administered 2020-08-21: 40 mg via ORAL
  Filled 2020-08-21: qty 1

## 2020-08-21 MED ORDER — EZETIMIBE 10 MG PO TABS
10.0000 mg | ORAL_TABLET | Freq: Every day | ORAL | 0 refills | Status: DC
  Filled 2020-08-21: qty 30, 30d supply, fill #0

## 2020-08-21 MED ORDER — EZETIMIBE 10 MG PO TABS
10.0000 mg | ORAL_TABLET | Freq: Every day | ORAL | Status: DC
  Administered 2020-08-21: 10 mg via ORAL
  Filled 2020-08-21: qty 1

## 2020-08-21 NOTE — Progress Notes (Signed)
Discharge instructions (including medications) discussed with and copy provided to patient/caregiver 

## 2020-08-21 NOTE — Discharge Summary (Addendum)
Discharge Summary    Patient ID: [name] [name] MRN: [id number]; DOB: Jul 05, 1965  Admit date: 08/19/2020 Discharge date: 08/21/2020  PCP:  Mliss SaxKremer, Hillman Alfred, MD   [facility] Providers Cardiologist:  Bryan Lemmaavid Harding, MD   Discharge Diagnoses    Principal Problem:   Unstable angina [facility](HCC) Active Problems:   Hyperlipidemia LDL goal <70   CAD S/P percutaneous coronary angioplasty   Essential hypertension    Diagnostic Studies/Procedures    Cath: 08/20/20  RPDA lesion is 100% stenosed after a high bifurcation RPAV. Extensive bridging, R-R & L-R collateral. Previously placed prox LAD to Dist LAD 3 overlapping stents have ~ 5% in-stent stenosis Previously placed 2nd Mrg-1 stent (unknown type) is widely patent. 2nd Mrg-2 lesion is 30% stenosed. ------------------------------------- The left ventricular systolic function is "LOW" NORMAL. The left ventricular ejection fraction is 50-55% by visual estimate. LV end diastolic pressure is normal. There is no aortic valve stenosis.   SUMMARY Stable Three-Vessel Disease: --> Widely patent overlapping stents in the proximal to mid LAD.  The distal LAD tapers into a relatively small caliber vessel that barely reaches the apex --> Widely patent OM2 stent with minimal LCx disease. --> Known mid RCA CTO after RV marginal branch.  The distal RCA system fills via bridging collaterals, right right (from the RV marginal) and left to right collaterals (from the distal LCx and 2nd Diag) Low normal EF with normal EDP     RECOMMENDATIONS Consider other nonanginal causes for chest pain. He could potentially be having some symptoms from the occluded PDA, but would be unlikely for him to have new onset chest pain from a chronically occluded vessel His blood pressure precludes titration of antianginal medications     Bryan Lemmaavid Harding, MD  Diagnostic Dominance: Right   _____________   History of Present Illness     [name] [name]  is a 55 y.o. male with CAD, ischemic cardiomyopathy with improved LV function, hyperlipidemia and hypertension who is being seen 08/19/2020 for the evaluation of chest pain and shortness of breath.   Hx of Anterior STEMI 05/2019 -> 3 overlapping DES p-mLAD, 1 DES OM2, CTO of  rPDA w/ L-R collaterals - med Rx.  EF was 35 to 40% which improved to 55 to 60% on follow-up echocardiogram on medical therapy.   Due to leg fatigue he was switching Lipitor 40 mg and 80 mg on a of health up until Fridaylternate days.  He was last seen by Dr. Herbie BaltimoreHarding April 2022.  Lipitor reduced to 40 mg daily.  Normal ABIs on follow-up study.   [name] [name] was in usual state when started to having waxing and waning chest pain the morning of admission.  Each episode occurred after some sort of activity but not all the time.  It was associated with shortness of breath.  The day prior had worse substernal chest tightness radiating to left side of chest.  This was associated with diaphoresis and shortness of breath.  He tried sublingual nitroglycerin on 3 separate occasions.  Only 1 nitro each time.  Symptoms did not improve much.  His symptoms were subsiding within 30 to 60 minutes.  He denies palpitation, orthopnea, PND, syncope, lower extremity edema or melena.  Compliant with his medication.  The day prior he was driving to work and had mild chest discomfort leading to ER evaluation.  Currently chest pain-free.  Patient reported acid reflux type symptoms prior to his MI.  This fells similar but less intense.   Potassium 3.4  High-sensitivity troponin 5 Glucose 130 Hemoglobin 15.4 Chest x-ray without acute abnormality  He was admitted to cardiology with plans for further work up with cardiac cath.   Hospital Course     Unstable angina with hx of CAD: Troponin negative.  EKG without acute ischemic changes.  Cardiac catheterization noted above with stable three-vessel disease.  Widely patent overlapping stents in the proximal to mid  LAD.  Widely patent OM2 stent with minimal left circumflex disease, known mid RCA CTO with filling via collaterals.  Consideration of nonanginal causes for chest pain though does have occluded PDA. -- continue ASA, Brilinta, statin, BB, ARB.  We will add Imdur 15 mg daily   Ischemic cardiomyopathy: Normalized LV function by last echocardiogram in 2021 -- remains volume stable on exam -- Continue Coreg and losartan   Hyperlipidemia: Reported leg fatigue on higher dose statin -- LDL 85 -- Continue Lipitor 40 mg for now, will add Zetia   Hypokalemia: K + 4.3 prior to discharge   Hypertension: stable with current regimen of coreg and losartan -- We will continue home regimen  General: Well developed, well nourished, male appearing in no acute distress. Head: Normocephalic, atraumatic.  Neck: Supple without bruits, JVD. Lungs:  Resp regular and unlabored, CTA. Heart: RRR, S1, S2, no S3, S4, or murmur; no rub. Abdomen: Soft, non-tender, non-distended with normoactive bowel sounds. No hepatomegaly. No rebound/guarding. No obvious abdominal masses. Extremities: No clubbing, cyanosis, edema. Distal pedal pulses are 2+ bilaterally. Right cath site stable without bruising or hematoma Neuro: Alert and oriented X 3. Moves all extremities spontaneously. Psych: Normal affect.  Patient was seen by Dr. Clifton James and deemed stable for discharge.  Follow-up in the office has been arranged.  Medications sent to [facility] pharmacy prior to discharge.  Educated by [employer].D.  Did the patient have an acute coronary syndrome (MI, NSTEMI, STEMI, etc) this admission?:  No                               Did the patient have a percutaneous coronary intervention (stent / angioplasty)?:  No.       _____________  Discharge Vitals Blood pressure 104/72, pulse (!) 58, temperature 97.7 F (36.5 C), temperature source Oral, resp. rate 16, height 5\' 7"  (1.702 m), weight 80.3 kg, SpO2 97 %.  Filed Weights   08/20/20 0628   Weight: 80.3 kg    Labs & Radiologic Studies    CBC Recent Labs    08/19/20 1709 08/20/20 0811  WBC 7.7 6.9  HGB 15.4 15.8  HCT 44.2 46.5  MCV 89.5 90.1  PLT 255 245   Basic Metabolic Panel Recent Labs    10/21/20 1709 08/20/20 0811  NA 136 141  K 3.4* 4.3  CL 104 106  CO2 24 28  GLUCOSE 130* 90  BUN 11 13  CREATININE 1.01 1.02  CALCIUM 9.2 9.5   Liver Function Tests No results for input(s): AST, ALT, ALKPHOS, BILITOT, PROT, ALBUMIN in the last 72 hours. No results for input(s): LIPASE, AMYLASE in the last 72 hours. High Sensitivity Troponin:   Recent Labs  Lab 08/19/20 1709 08/19/20 1909 08/20/20 0811 08/20/20 1003  TROPONINIHS 5 5 6 5     BNP Invalid input(s): POCBNP D-Dimer No results for input(s): DDIMER in the last 72 hours. Hemoglobin A1C Recent Labs    08/20/20 0811  HGBA1C 5.6   Fasting Lipid Panel Recent Labs    08/20/20 0811  CHOL 132  HDL 29*  LDLCALC 85  TRIG 92  CHOLHDL 4.6   Thyroid Function Tests No results for input(s): TSH, T4TOTAL, T3FREE, THYROIDAB in the last 72 hours.  Invalid input(s): FREET3 _____________  Nocturnal polysomnography  Result Date: 07/30/2020 [name], MD     08/03/2020  5:21 PM PATIENT'S NAME:  [name] [name] DOB:      [birth year]     MR#:    [id number]    DATE OF RECORDING: 07/30/2020 REFERRING M.D.:  Nadene Rubins, MD Study Performed:   Baseline Polysomnogram HISTORY: 55 year old man with a history of MI with status post 4 coronary stent placements in April 2021, hyperlipidemia, and overweight state, who reports a prior diagnosis of obstructive sleep apnea. He reports snoring and daytime somnolence, as well as occasional morning headaches and nocturia. The patient endorsed the Epworth Sleepiness Scale at 11 points. The patient's weight 180 pounds with a height of 67 (inches), resulting in a BMI of 28.4 kg/m2. The patient's neck circumference measured 16.2 inches. CURRENT MEDICATIONS: Lipitor, Coreg, Cozaar,  Nitrostat, Brilinta  PROCEDURE:  This is a multichannel digital polysomnogram utilizing the Somnostar 11.2 system.  Electrodes and sensors were applied and monitored per AASM Specifications.   EEG, EOG, Chin and Limb EMG, were sampled at 200 Hz.  ECG, Snore and Nasal Pressure, Thermal Airflow, Respiratory Effort, CPAP Flow and Pressure, Oximetry was sampled at 50 Hz. Digital video and audio were recorded.    BASELINE STUDY Lights Out was at 22:05 and Lights On at 04:56.  Total recording time (TRT) was 411.5 minutes, with a total sleep time (TST) of 326.5 minutes.   The patient's sleep latency was 16.5 minutes.  REM latency was 72.5 minutes, which is normal.  The sleep efficiency was 79.3 %.   SLEEP ARCHITECTURE: WASO (Wake after sleep onset) was 68 minutes with overall mild sleep fragmentation noted. There were 11 minutes in Stage N1, 199 minutes Stage N2, 19.5 minutes Stage N3 and 97 minutes in Stage REM.  The percentage of Stage N1 was 3.4%, Stage N2 was 60.9%, which is mildly increased, Stage N3 was 6.% and Stage R (REM sleep) was 29.7%, which is increased. The arousals were noted as: 28 were spontaneous, 0 were associated with PLMs, 46 were associated with respiratory events. RESPIRATORY ANALYSIS:  There were a total of 63 respiratory events:  20 obstructive apneas, 1 central apneas and 7 mixed apneas with a total of 28 apneas and an apnea index (AI) of 5.1 /hour. There were 35 hypopneas with a hypopnea index of 6.4 /hour. The patient also had 0 respiratory event related arousals (RERAs).    The total APNEA/HYPOPNEA INDEX (AHI) was 11.6/hour and the total RESPIRATORY DISTURBANCE INDEX was  11.6 /hour.  31 events occurred in REM sleep and 35 events in NREM. The REM AHI was  19.2 /hour, versus a non-REM AHI of 8.4. The patient spent 113.5 minutes of total sleep time in the supine position and 213 minutes in non-supine.. The supine AHI was 13.2 versus a non-supine AHI of 10.7. OXYGEN SATURATION & C02:  The Wake  baseline 02 saturation was 96%, with the lowest being 83%. Time spent below 89% saturation equaled 8 minutes.  PERIODIC LIMB MOVEMENTS: The patient had a total of 0 Periodic Limb Movements.  The Periodic Limb Movement (PLM) index was 0 and the PLM Arousal index was 0/hour. Audio and video analysis did not show any abnormal or unusual movements, behaviors, phonations or vocalizations. The patient took 1 bathroom  break. Mild to moderate snoring was noted. The EKG was in keeping with normal sinus rhythm (NSR). Post-study, the patient indicated that sleep was the same as usual. IMPRESSION: Obstructive Sleep Apnea (OSA) Dysfunctions associated with sleep stages or arousal from sleep RECOMMENDATIONS: This study demonstrates overall mild obstructive sleep apnea, moderate during REM sleep with a total AHI of 11.6/hour, REM AHI of 19.2/hour, supine AHI of 13.2/hour and O2 nadir of 83%. Given the patient's medical history and sleep related complaints, treatment with positive airway pressure is recommended; this can be achieved in the form of autoPAP. Alternatively, a full-night CPAP titration study would allow optimization of therapy if needed. Other treatment options may include avoidance of supine sleep position along with weight loss, upper airway or jaw surgery in selected patients or the use of an oral appliance in certain patients. ENT evaluation and/or consultation with a maxillofacial surgeon or dentist may be feasible in some instances.   Please note that untreated obstructive sleep apnea may carry additional perioperative morbidity. Patients with significant obstructive sleep apnea should receive perioperative PAP therapy and the surgeons and particularly the anesthesiologist should be informed of the diagnosis and the severity of the sleep disordered breathing. The patient should be cautioned not to drive, work at heights, or operate dangerous or heavy equipment when tired or sleepy. Review and reiteration of good  sleep hygiene measures should be pursued with any patient. The patient will be seen in follow-up by Dr. Frances Furbish at [facility] for discussion of the test results and further management strategies. The referring provider will be notified of the test results. I certify that I have reviewed the entire raw data recording prior to the issuance of this report in accordance with the Standards of Accreditation of the American Academy of Sleep Medicine (AASM) [name], MD, PhD Diplomat, American Board of Neurology and Sleep Medicine (Neurology and Sleep Medicine)   DG Chest 2 View  Result Date: 08/19/2020 CLINICAL DATA:  Chest pain and shortness of breath for 5 days EXAM: CHEST - 2 VIEW COMPARISON:  09/23/2019 FINDINGS: The heart size and mediastinal contours are within normal limits. Both lungs are clear. The visualized skeletal structures are unremarkable. IMPRESSION: No active cardiopulmonary disease. Electronically Signed   By: Signa Kell M.D.   On: 08/19/2020 18:04   CARDIAC CATHETERIZATION  Result Date: 08/20/2020  RPDA lesion is 100% stenosed after a high bifurcation RPAV. Extensive bridging, R-R & L-R collateral.  Previously placed prox LAD to Dist LAD 3 overlapping stents have ~ 5% in-stent stenosis  Previously placed 2nd Mrg-1 stent (unknown type) is widely patent.  2nd Mrg-2 lesion is 30% stenosed.  -------------------------------------  The left ventricular systolic function is "LOW" NORMAL. The left ventricular ejection fraction is 50-55% by visual estimate.  LV end diastolic pressure is normal.  There is no aortic valve stenosis.  SUMMARY  Stable Three-Vessel Disease: --> Widely patent overlapping stents in the proximal to mid LAD.  The distal LAD tapers into a relatively small caliber vessel that barely reaches the apex --> Widely patent OM2 stent with minimal LCx disease. --> Known mid RCA CTO after RV marginal branch.  The distal RCA system fills via bridging collaterals, right right (from the RV  marginal) and left to right collaterals (from the distal LCx and 2nd Diag)  Low normal EF with normal EDP RECOMMENDATIONS  Consider other nonanginal causes for chest pain.  He could potentially be having some symptoms from the occluded PDA, but would be unlikely for him  to have new onset chest pain from a chronically occluded vessel  His blood pressure precludes titration of antianginal medications [name], MD  Disposition   Pt is being discharged home today in good condition.  Follow-up Plans & Appointments     Follow-up Information     Leone Brand, NP Follow up on 09/29/2020.   Specialties: Cardiology, Radiology Why: at 11:15am for your follow up appt with Dr. Elissa Hefty NP Vernona Rieger. This will be at the [address] location as this is the first avaliable appt. Contact information: [address] [place] [address] [phone]                Discharge Instructions     Diet - low sodium heart healthy   Complete by: As directed    Discharge instructions   Complete by: As directed    Radial Site Care Refer to this sheet in the next few weeks. These instructions provide you with information on caring for yourself after your procedure. Your caregiver may also give you more specific instructions. Your treatment has been planned according to current medical practices, but problems sometimes occur. Call your caregiver if you have any problems or questions after your procedure. HOME CARE INSTRUCTIONS You may shower the day after the procedure. Remove the bandage (dressing) and gently wash the site with plain soap and water. Gently pat the site dry.  Do not apply powder or lotion to the site.  Do not submerge the affected site in water for 3 to 5 days.  Inspect the site at least twice daily.  Do not flex or bend the affected arm for 24 hours.  No lifting over 5 pounds (2.3 kg) for 5 days after your procedure.  Do not drive home if you are discharged the same  day of the procedure. Have someone else drive you.  You may drive 24 hours after the procedure unless otherwise instructed by your caregiver.  What to expect: Any bruising will usually fade within 1 to 2 weeks.  Blood that collects in the tissue (hematoma) may be painful to the touch. It should usually decrease in size and tenderness within 1 to 2 weeks.  SEEK IMMEDIATE MEDICAL CARE IF: You have unusual pain at the radial site.  You have redness, warmth, swelling, or pain at the radial site.  You have drainage (other than a small amount of blood on the dressing).  You have chills.  You have a fever or persistent symptoms for more than 72 hours.  You have a fever and your symptoms suddenly get worse.  Your arm becomes pale, cool, tingly, or numb.  You have heavy bleeding from the site. Hold pressure on the site.   Increase activity slowly   Complete by: As directed        Discharge Medications   Allergies as of 08/21/2020   No Known Allergies      Medication List     TAKE these medications    atorvastatin 40 MG tablet Commonly known as: LIPITOR Take 1 tablet (40 mg total) by mouth daily.   carvedilol 6.25 MG tablet Commonly known as: COREG Take 3.125mg  (half tablet of 6.25) in the morning and 6.25mg  (whole tablet) in the evening. What changed:  how much to take how to take this when to take this additional instructions   ezetimibe 10 MG tablet Commonly known as: ZETIA Take 1 tablet (10 mg total) by mouth daily.   isosorbide mononitrate 30 MG  24 hr tablet Commonly known as: IMDUR Take 0.5 tablets (15 mg total) by mouth daily.   losartan 25 MG tablet Commonly known as: COZAAR Take 0.5 tablets (12.5 mg total) by mouth daily.   nitroGLYCERIN 0.4 MG SL tablet Commonly known as: NITROSTAT Place 1 tablet (0.4 mg total) under the tongue every 5 (five) minutes as needed for chest pain (up to 3 doses. If taking 3rd dose, call 911.).   ticagrelor 60 MG Tabs  tablet Commonly known as: Brilinta Take 1 tablet (60 mg total) by mouth 2 (two) times daily.         Outstanding Labs/Studies   FLP/LFTs in 8 weeks  Duration of Discharge Encounter   Greater than 30 minutes including physician time.  Signed, Laverda Page, NP 08/21/2020, 8:41 AM   I have personally seen and examined this patient. I agree with the assessment and plan as outlined above.  Doing well this am. NO chest pain. Will add Imdur and Zetia.  Discharge home today.   Verne Carrow 08/21/2020 9:26 AM

## 2020-08-21 NOTE — Telephone Encounter (Signed)
Noted. Thanks.

## 2020-08-31 DIAGNOSIS — D485 Neoplasm of uncertain behavior of skin: Secondary | ICD-10-CM | POA: Diagnosis not present

## 2020-09-12 ENCOUNTER — Other Ambulatory Visit: Payer: Self-pay | Admitting: Adult Health

## 2020-09-28 NOTE — Progress Notes (Signed)
Cardiology Office Note   Date:  09/29/2020   ID:  [name] [name], DOB 12-31-1965, MRN [id number]  PCP:  Mliss Sax, MD  Cardiologist:  Dr. Herbie Baltimore    Chief Complaint  Patient presents with   Hospitalization Follow-up      Post hospital History of Present Illness: [name] [name] is a 55 y.o. male who presents for post hospitalization follow up for cath. He has a hx of CAD, ischemic cardiomyopathy with improved LV function, hyperlipidemia and hypertension who is being seen 08/19/2020 for the evaluation of chest pain and shortness of breath.   Hx of Anterior STEMI 05/2019 -> 3 overlapping DES p-mLAD, 1 DES OM2, CTO of  rPDA w/ L-R collaterals - med Rx.  EF was 35 to 40% which improved to 55 to 60% on follow-up echocardiogram on medical therapy.  Admitted 08/19/20 with waxing and waning chest pain.  + associated with SOB.  Admitted with no MI, cath was done with stable disease Widely patent overlapping stents in the proximal to mid LAD.  Widely patent OM2 stent with minimal left circumflex disease, known mid RCA CTO with filling via collaterals.  Consideration of nonanginal causes for chest pain though does have occluded PDA  EF 50-55% Imdur and zetia added at discharge. But no refills given.  To Recheck hepatic and lipids in  this week with changes but he has been out of meds for 2 weeks no refills.so will wait to check labs with labs in Nov with Dr. Herbie Baltimore.   Today no chest pain and no change in his usual SOB.  He feels well. Cath site well healed. Out of his zetia and imdur.  Needs refill on NTG. Feels well. Has insurance papers to fill ut and will bring to office.     Past Medical History:  Diagnosis Date   History of acute anterior wall MI 05/29/2019   100% LAD after SP1 -> (long lesion); 90% prox OM 2 (mod LPL 60%), ~100% CTO of RPDA (high bifurcation, fills via L-R collateral flow) - 3 overlapping DES in LAD & 1 in OM2   Hyperlipidemia LDL goal <70     Multiple vessel coronary artery disease 05/29/2019   Cath 05/29/2019 : Anterior STEMI -> Severe Three-Vessel Disease:  CULPRIT LESION 100% LAD after SP1 (long); 90% pOM 2 (with mod LPL), ~100 CTO of RPDA (high bifurcation, L-R collateral); Successful DES PCI p-mLAD w/ 3 overlapping Resolute Onyx DES (3.0 x 12, 2.5 x 38, & 2.25 x 15 --> 3.65-2.8 mm), OM2 Resolute Onyx DES 2 x 12 - 2.2 mm.     Past Surgical History:  Procedure Laterality Date   CORONARY STENT INTERVENTION N/A 05/29/2019   Procedure: CORONARY STENT INTERVENTION;  Surgeon: Marykay Lex, MD;  Location: [facility] INVASIVE CV LAB;  Service: Cardiovascular;; 2nd LESION: OM 2 with Resolute Onyx DES 2.0 mm x 12 mm--postdilated to 2.2 mm)   CORONARY/GRAFT ACUTE MI REVASCULARIZATION N/A 05/29/2019   Procedure: CORONARY/GRAFT ACUTE MI REVASCULARIZATION;  Surgeon: Marykay Lex, MD;  Location: [facility] INVASIVE CV LAB;  Service: Cardiovascular;  Successful DES PCI of extensive proximal to mid LAD using 3 overlapping Resolute Onyx DES stents (3.0 mm x 12 mm, 2.5 mm x 38 mm, and 2.25 mm x 15 mm -> entire segment postdilated from 3.65 mm down to 2.8 mm)    LEFT HEART CATH AND CORONARY ANGIOGRAPHY N/A 05/29/2019   Procedure: LEFT HEART CATH AND CORONARY ANGIOGRAPHY;  Surgeon: Marykay Lex, MD;  Location: [facility] INVASIVE CV LAB;  Service: Cardiovascular;; CULPRIT LESION 100% LAD after SP1 (long lesion); 90% proximal OM 2 (with moderate LPL), 100% CTO of RPDA (high bifurcation, fills via L-R collaterals; EF ~35-40% mid-apical Hypo-AK. LVEDP 22 mmHg.   LEFT HEART CATH AND CORONARY ANGIOGRAPHY N/A 08/20/2020   Procedure: LEFT HEART CATH AND CORONARY ANGIOGRAPHY;  Surgeon: Marykay Lex, MD;  Location: [facility] INVASIVE CV LAB;  Service: Cardiovascular;  Laterality: N/A;   TRANSTHORACIC ECHOCARDIOGRAM  09/2019   EF ~ 55-60%, Mid-apical Inferoseptal HK & AK of apical and anteroseptal wall.  Gr II DD.  Normal RV size and function.  Mild aortic sclerosis.   TRANSTHORACIC  ECHOCARDIOGRAM  05/30/2019    (Anterior STEMI) --> EF 35 to 40%.  Entire anterior wall, mid and distal anterior septum and apex are akinetic.  Mild LVH, GR 1 DD.     Current Outpatient Medications  Medication Sig Dispense Refill   atorvastatin (LIPITOR) 40 MG tablet Take 40 mg by mouth daily.     carvedilol (COREG) 6.25 MG tablet Take 3.125mg  (half tablet of 6.25) in the morning and 6.25mg  (whole tablet) in the evening. 180 tablet 3   losartan (COZAAR) 25 MG tablet TAKE 1/2 TABLET BY MOUTH EVERY DAY 45 tablet 3   ticagrelor (BRILINTA) 60 MG TABS tablet Take 1 tablet (60 mg total) by mouth 2 (two) times daily. 180 tablet 3   ezetimibe (ZETIA) 10 MG tablet Take 1 tablet (10 mg total) by mouth daily. 90 tablet 3   isosorbide mononitrate (IMDUR) 30 MG 24 hr tablet Take 0.5 tablets (15 mg total) by mouth daily. 45 tablet 3   nitroGLYCERIN (NITROSTAT) 0.4 MG SL tablet Place 1 tablet (0.4 mg total) under the tongue every 5 (five) minutes as needed for chest pain (up to 3 doses. If taking 3rd dose, call 911.). 25 tablet 3   No current facility-administered medications for this visit.    Allergies:   Patient has no known allergies.    Social History:  The patient  reports that he has never smoked. He has never used smokeless tobacco. He reports that he does not drink alcohol and does not use drugs.   Family History:  The patient's family history includes Hypertension in his mother.    ROS:  General:no colds or fevers, no weight changes Skin:no rashes or ulcers HEENT:no blurred vision, no congestion CV:see HPI PUL:see HPI GI:no diarrhea constipation or melena, no indigestion GU:no hematuria, no dysuria MS:no joint pain, no claudication Neuro:no syncope, no lightheadedness Endo:no diabetes, no thyroid disease  Wt Readings from Last 3 Encounters:  09/29/20 179 lb 6.4 oz (81.4 kg)  08/20/20 177 lb 1.6 oz (80.3 kg)  06/18/20 180 lb (81.6 kg)     PHYSICAL EXAM: VS:  BP (!) 102/58    Pulse 60   Ht 5\' 7"  (1.702 m)   Wt 179 lb 6.4 oz (81.4 kg)   SpO2 98%   BMI 28.10 kg/m  , BMI Body mass index is 28.1 kg/m. General:Pleasant affect, NAD Skin:Warm and dry, brisk capillary refill HEENT:normocephalic, sclera clear, mucus membranes moist Neck:supple, no JVD, no bruits  Heart:S1S2 RRR without murmur, gallup, rub or click, cath site rt wrist without hematoma, well healed.  Lungs:clear without rales, rhonchi, or wheezes , non tender, + BS, do not palpate liver spleen or masses Ext:no lower ext edema, 2+ pedal pulses, 2+ radial pulses Neuro:alert and oriented X 3, MAE, follows commands, + facial symmetry    EKG:  EKG is NOT ordered today.  Recent Labs: 05/14/2020: ALT 22 08/20/2020: BUN 13; Creatinine, Ser 1.02; Hemoglobin 15.8; Platelets 245; Potassium 4.3; Sodium 141    Lipid Panel    Component Value Date/Time   CHOL 132 08/20/2020 0811   CHOL 117 01/14/2020 0000   TRIG 92 08/20/2020 0811   HDL 29 (L) 08/20/2020 0811   HDL 35 (L) 01/14/2020 0000   CHOLHDL 4.6 08/20/2020 0811   VLDL 18 08/20/2020 0811   LDLCALC 85 08/20/2020 0811   LDLCALC 65 01/14/2020 0000       Other studies Reviewed: Additional studies/ records that were reviewed today include: . Cath: 08/20/20   RPDA lesion is 100% stenosed after a high bifurcation RPAV. Extensive bridging, R-R & L-R collateral. Previously placed prox LAD to Dist LAD 3 overlapping stents have ~ 5% in-stent stenosis Previously placed 2nd Mrg-1 stent (unknown type) is widely patent. 2nd Mrg-2 lesion is 30% stenosed. ------------------------------------- The left ventricular systolic function is "LOW" NORMAL. The left ventricular ejection fraction is 50-55% by visual estimate. LV end diastolic pressure is normal. There is no aortic valve stenosis.   SUMMARY Stable Three-Vessel Disease: --> Widely patent overlapping stents in the proximal to mid LAD.  The distal LAD tapers into a relatively small caliber vessel  that barely reaches the apex --> Widely patent OM2 stent with minimal LCx disease. --> Known mid RCA CTO after RV marginal branch.  The distal RCA system fills via bridging collaterals, right right (from the RV marginal) and left to right collaterals (from the distal LCx and 2nd Diag) Low normal EF with normal EDP     RECOMMENDATIONS Consider other nonanginal causes for chest pain. He could potentially be having some symptoms from the occluded PDA, but would be unlikely for him to have new onset chest pain from a chronically occluded vessel His blood pressure precludes titration of antianginal medications     [name], MD   Diagnostic Dominance: Right   _____________  ASSESSMENT AND PLAN:  1.  CAD with recent admit for unstable angina.  Cath was stable- imdur added with known occluded PDA.Marland Kitchen continue.. hx of prior PCI overlapping stents to pLAD and dLAD, patent and prior 2nd marginal patent.  -hospital records and labs reviewed and above studies. .   2.   Hx of ICM post ant wall STEMI in 2021 now with normal EF  3.  HLD with LDL of 85, Zetia added will recheck labs on next visit in Nov with Dr. Herbie Baltimore.  4.  HTN stable continue statin and zetia. Added due to need for lower dose of lipitor due to leg fatigue .     Current medicines are reviewed with the patient today.  The patient Has no concerns regarding medicines.  The following changes have been made:  See above Labs/ tests ordered today include:see above  Disposition:   FU:  see above  Signed, Nada Boozer, NP  09/29/2020 11:55 AM    [facility] [address] [place], [place], [place]  [address]/ [address] [place], [place] Phone: [phone]; Fax: [phone]  [phone]

## 2020-09-29 ENCOUNTER — Ambulatory Visit (INDEPENDENT_AMBULATORY_CARE_PROVIDER_SITE_OTHER): Payer: BC Managed Care – PPO | Admitting: Cardiology

## 2020-09-29 ENCOUNTER — Encounter: Payer: Self-pay | Admitting: Cardiology

## 2020-09-29 ENCOUNTER — Telehealth: Payer: Self-pay | Admitting: Neurology

## 2020-09-29 ENCOUNTER — Other Ambulatory Visit: Payer: Self-pay

## 2020-09-29 VITALS — BP 102/58 | HR 60 | Ht 67.0 in | Wt 179.4 lb

## 2020-09-29 DIAGNOSIS — I255 Ischemic cardiomyopathy: Secondary | ICD-10-CM | POA: Diagnosis not present

## 2020-09-29 DIAGNOSIS — E785 Hyperlipidemia, unspecified: Secondary | ICD-10-CM

## 2020-09-29 DIAGNOSIS — I2510 Atherosclerotic heart disease of native coronary artery without angina pectoris: Secondary | ICD-10-CM | POA: Diagnosis not present

## 2020-09-29 DIAGNOSIS — I10 Essential (primary) hypertension: Secondary | ICD-10-CM | POA: Diagnosis not present

## 2020-09-29 MED ORDER — ISOSORBIDE MONONITRATE ER 30 MG PO TB24: 15.0000 mg | ORAL_TABLET | Freq: Every day | ORAL | 3 refills | Status: DC

## 2020-09-29 MED ORDER — NITROGLYCERIN 0.4 MG SL SUBL: 0.4000 mg | SUBLINGUAL_TABLET | SUBLINGUAL | 3 refills | Status: DC | PRN

## 2020-09-29 MED ORDER — EZETIMIBE 10 MG PO TABS: 10.0000 mg | ORAL_TABLET | Freq: Every day | ORAL | 3 refills | Status: DC

## 2020-09-29 NOTE — Telephone Encounter (Signed)
Pt's wife called she states she spoke to [facility] and they haven't received any information about pt's sleep results. Wife states we can fax over the information to [phone]. Wife requesting a call back.

## 2020-09-29 NOTE — Telephone Encounter (Signed)
Secure message sent to [facility] team. Will update when I hear back. I also sent a mychart message to pt's active mychart which shows last login date of 09/29/20.

## 2020-09-29 NOTE — Patient Instructions (Signed)
Medication Instructions:  Your physician recommends that you continue on your current medications as directed. Please refer to the Current Medication list given to you today.  *If you need a refill on your cardiac medications before your next appointment, please call your pharmacy*   Lab Work: None ordered  If you have labs (blood work) drawn today and your tests are completely normal, you will receive your results only by: MyChart Message (if you have MyChart) OR A paper copy in the mail If you have any lab test that is abnormal or we need to change your treatment, we will call you to review the results.   Testing/Procedures: None ordered   Follow-Up: At [facility], you and your health needs are our priority.  As part of our continuing mission to provide you with exceptional heart care, we have created designated Provider Care Teams.  These Care Teams include your primary Cardiologist (physician) and Advanced Practice Providers (APPs -  Physician Assistants and Nurse Practitioners) who all work together to provide you with the care you need, when you need it.  We recommend signing up for the patient portal called "MyChart".  Sign up information is provided on this After Visit Summary.  MyChart is used to connect with patients for Virtual Visits (Telemedicine).  Patients are able to view lab/test results, encounter notes, upcoming appointments, etc.  Non-urgent messages can be sent to your provider as well.   To learn more about what you can do with MyChart, go to [email or web address].    Your next appointment:   3 month(s)  The format for your next appointment:   In Person  Provider:   You may see [name], MD or one of the following Advanced Practice Providers on your designated Care Team:   Theodore Demark, PA-C Juanda Crumble, PA-C Joni Reining, DNP, ANP   Other Instructions

## 2020-10-09 ENCOUNTER — Other Ambulatory Visit (HOSPITAL_COMMUNITY): Payer: Self-pay

## 2020-11-12 ENCOUNTER — Other Ambulatory Visit: Payer: Self-pay

## 2020-11-13 ENCOUNTER — Encounter: Payer: Self-pay | Admitting: Family Medicine

## 2020-11-13 ENCOUNTER — Ambulatory Visit (INDEPENDENT_AMBULATORY_CARE_PROVIDER_SITE_OTHER): Payer: BC Managed Care – PPO | Admitting: Family Medicine

## 2020-11-13 VITALS — BP 118/72 | HR 79 | Temp 97.4°F | Ht 67.0 in | Wt 179.4 lb

## 2020-11-13 DIAGNOSIS — Z789 Other specified health status: Secondary | ICD-10-CM | POA: Diagnosis not present

## 2020-11-13 DIAGNOSIS — R0683 Snoring: Secondary | ICD-10-CM | POA: Diagnosis not present

## 2020-11-13 DIAGNOSIS — L255 Unspecified contact dermatitis due to plants, except food: Secondary | ICD-10-CM

## 2020-11-13 DIAGNOSIS — Z0000 Encounter for general adult medical examination without abnormal findings: Secondary | ICD-10-CM

## 2020-11-13 DIAGNOSIS — Z23 Encounter for immunization: Secondary | ICD-10-CM | POA: Diagnosis not present

## 2020-11-13 MED ORDER — TRIAMCINOLONE ACETONIDE 0.5 % EX OINT: 1.0000 "application " | TOPICAL_OINTMENT | Freq: Two times a day (BID) | CUTANEOUS | 1 refills | Status: DC

## 2020-11-13 NOTE — Progress Notes (Signed)
Established Patient Office Visit  Subjective:  Patient ID: [name] [name], male    DOB: Aug 29, 1965  Age: 55 y.o. MRN: [id number]  CC:  Chief Complaint  Patient presents with   Follow-up    6 month f/u.  Rash on LT arm x 2 weeks.  ? Shingles and flu shot.    HPI [name] presents for follow-up health maintenance, pruritic rash on arms after working in yard.,  Apnea and medication intolerance.  At all transient muscle weakness in his arms was due to the atorvastatin but he passed he continues with that medication and is now taking Zetia.  Was seen recently in the emergency room for chest pain and diagnosed with unstable angina.  Imdur has been added.  He is doing better.  Has been working in the yard and pulled a deadline from his tree and developed a pruritic rash on his arms 2 weeks ago.  Seems to be doing better he asks about the Shingrix vaccine.  Has never had a flu vaccine and is concerned about taking 1.  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     Past Surgical History:  Procedure Laterality Date   CORONARY STENT INTERVENTION N/A 05/29/2019   Procedure: CORONARY STENT INTERVENTION;  Surgeon: Marykay Lex, MD;  Location: [facility] INVASIVE CV LAB;  Service: Cardiovascular;; 2nd LESION: OM 2 with Resolute Onyx DES 2.0 mm x 12 mm--postdilated  to 2.2 mm)   CORONARY/GRAFT ACUTE MI REVASCULARIZATION N/A 05/29/2019   Procedure: CORONARY/GRAFT ACUTE MI REVASCULARIZATION;  Surgeon: Marykay Lex, MD;  Location: [facility] INVASIVE CV LAB;  Service: Cardiovascular;  Successful DES PCI of extensive proximal to mid LAD using 3 overlapping Resolute Onyx DES stents (3.0 mm x 12 mm, 2.5 mm x 38 mm, and 2.25 mm x 15 mm -> entire segment postdilated from 3.65 mm down to 2.8 mm)    LEFT HEART CATH AND CORONARY ANGIOGRAPHY N/A 05/29/2019   Procedure: LEFT HEART CATH AND CORONARY ANGIOGRAPHY;  Surgeon: Marykay Lex, MD;  Location: [facility] INVASIVE CV LAB;  Service: Cardiovascular;; CULPRIT LESION 100% LAD after SP1 (long lesion); 90% proximal OM 2 (with moderate LPL), 100% CTO of RPDA (high bifurcation, fills via L-R collaterals; EF ~35-40% mid-apical Hypo-AK. LVEDP 22 mmHg.   LEFT HEART CATH AND CORONARY ANGIOGRAPHY N/A 08/20/2020   Procedure: LEFT HEART CATH AND CORONARY ANGIOGRAPHY;  Surgeon: Marykay Lex, MD;  Location: [facility] INVASIVE CV LAB;  Service: Cardiovascular;  Laterality: N/A;   TRANSTHORACIC ECHOCARDIOGRAM  09/2019   EF ~ 55-60%, Mid-apical Inferoseptal HK & AK of apical and anteroseptal wall.  Gr II DD.  Normal RV size and function.  Mild aortic sclerosis.   TRANSTHORACIC ECHOCARDIOGRAM  05/30/2019    (Anterior STEMI) -->  EF 35 to 40%.  Entire anterior wall, mid and distal anterior septum and apex are akinetic.  Mild LVH, GR 1 DD.    Family History  Problem Relation Age of Onset   Hypertension Mother     Social History   Socioeconomic History   Marital status: Married    Spouse name: Not on file   Number of children: Not on file   Years of education: 12   Highest education level: High school graduate  Occupational History   Occupation: Editor, commissioning: [employer]  Tobacco Use   Smoking status: Never   Smokeless tobacco: Never  Vaping Use   Vaping Use: Never used  Substance and Sexual Activity    Alcohol use: Never   Drug use: Never   Sexual activity: Yes  Other Topics Concern   Not on file  Social History Narrative   Lives with his wife in [place].      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   Social Determinants of Health   Financial Resource Strain: Not on file  Food Insecurity: Not on file  Transportation Needs: Not on file  Physical Activity: Not on file  Stress: Not on file  Social Connections: Not on file  Intimate Partner Violence: Not on file    Outpatient Medications Prior to Visit  Medication Sig Dispense Refill   atorvastatin (LIPITOR) 40 MG tablet Take 40 mg by mouth daily.     carvedilol (COREG) 6.25 MG tablet Take 3.125mg  (half tablet of 6.25) in the morning and 6.25mg  (whole tablet) in the evening. 180 tablet 3   ezetimibe (ZETIA) 10 MG tablet Take 1 tablet (10 mg total) by mouth daily. 90 tablet 3   isosorbide mononitrate (IMDUR) 30 MG 24 hr tablet Take 0.5 tablets (15 mg total) by mouth daily. 45 tablet 3   losartan (COZAAR) 25 MG tablet TAKE 1/2 TABLET BY MOUTH EVERY DAY 45 tablet 3   nitroGLYCERIN (NITROSTAT) 0.4 MG SL tablet Place 1 tablet (0.4 mg total) under the tongue every 5 (five) minutes as needed for chest pain (up to 3 doses. If taking 3rd dose, call 911.). 25 tablet 3   ticagrelor (BRILINTA) 60 MG TABS tablet Take 1 tablet (60 mg total) by mouth 2 (two) times daily. 180 tablet 3   No facility-administered medications prior to visit.    No Known Allergies  ROS Review of Systems  Constitutional: Negative.   HENT: Negative.    Eyes:  Negative for photophobia and visual disturbance.  Respiratory: Negative.  Negative for chest tightness and shortness of breath.   Cardiovascular:  Negative for chest pain and palpitations.  Gastrointestinal:  Negative for abdominal pain, anal bleeding, blood in stool and constipation.  Genitourinary:  Negative for difficulty urinating, frequency and urgency.   Musculoskeletal:  Negative for gait problem and joint swelling.  Skin:  Positive for rash. Negative for pallor.  Hematological:  Does not bruise/bleed easily.  Psychiatric/Behavioral: Negative.       Objective:    Physical Exam Vitals and nursing note reviewed.  Constitutional:      General: He is not in acute distress.    Appearance: Normal appearance. He is not ill-appearing, toxic-appearing or diaphoretic.  HENT:     Head: Normocephalic and atraumatic.     Right Ear: Tympanic membrane, ear canal and external ear normal.     Left Ear: Tympanic membrane, ear canal  and external ear normal.     Mouth/Throat:     Mouth: Mucous membranes are moist.     Pharynx: Oropharynx is clear. No oropharyngeal exudate or posterior oropharyngeal erythema.  Eyes:     General: No scleral icterus.       Right eye: No discharge.        Left eye: No discharge.     Extraocular Movements: Extraocular movements intact.     Conjunctiva/sclera: Conjunctivae normal.     Pupils: Pupils are equal, round, and reactive to light.  Cardiovascular:     Rate and Rhythm: Normal rate and regular rhythm.  Pulmonary:     Effort: Pulmonary effort is normal.     Breath sounds: Normal breath sounds.  Abdominal:     General: Abdomen is flat. Bowel sounds are normal. There is no distension.     Palpations: Abdomen is soft. There is no mass.     Tenderness: There is no abdominal tenderness. There is no guarding or rebound.     Hernia: A hernia is present. Hernia is present in the ventral area.  Musculoskeletal:     Cervical back: No rigidity or tenderness.  Lymphadenopathy:     Cervical: No cervical adenopathy.  Skin:    General: Skin is warm and dry.       Neurological:     Mental Status: He is alert and oriented to person, place, and time.  Psychiatric:        Mood and Affect: Mood normal.        Behavior: Behavior normal.    BP 118/72   Pulse 79   Temp (!) 97.4 F (36.3 C) (Temporal)   Ht 5\' 7"   (1.702 m)   Wt 179 lb 6.4 oz (81.4 kg)   SpO2 99%   BMI 28.10 kg/m  Wt Readings from Last 3 Encounters:  11/13/20 179 lb 6.4 oz (81.4 kg)  09/29/20 179 lb 6.4 oz (81.4 kg)  08/20/20 177 lb 1.6 oz (80.3 kg)     Health Maintenance Due  Topic Date Due   Hepatitis C Screening  Never done   TETANUS/TDAP  Never done   Zoster Vaccines- Shingrix (1 of 2) Never done   COLONOSCOPY (Pts 45-32yrs Insurance coverage will need to be confirmed)  Never done   COVID-19 Vaccine (4 - Booster) 11/02/2019   INFLUENZA VACCINE  09/14/2020    There are no preventive care reminders to display for this patient.  Lab Results  Component Value Date   TSH 1.221 05/29/2019   Lab Results  Component Value Date   WBC 6.9 08/20/2020   HGB 15.8 08/20/2020   HCT 46.5 08/20/2020   MCV 90.1 08/20/2020   PLT 245 08/20/2020   Lab Results  Component Value Date   NA 141 08/20/2020   K 4.3 08/20/2020   CO2 28 08/20/2020   GLUCOSE 90 08/20/2020   BUN 13 08/20/2020   CREATININE 1.02 08/20/2020   BILITOT 0.6 05/14/2020   ALKPHOS 73 05/14/2020   AST 14 05/14/2020   ALT 22 05/14/2020   PROT 6.8 05/14/2020   ALBUMIN 4.4 05/14/2020   CALCIUM 9.5 08/20/2020   ANIONGAP 7 08/20/2020   GFR 93.44 05/14/2020   Lab Results  Component Value Date   CHOL 132 08/20/2020   Lab Results  Component Value Date   HDL 29 (L) 08/20/2020   Lab Results  Component Value Date   LDLCALC 85 08/20/2020   Lab Results  Component Value Date  TRIG 92 08/20/2020   Lab Results  Component Value Date   CHOLHDL 4.6 08/20/2020   Lab Results  Component Value Date   HGBA1C 5.6 08/20/2020      Assessment & Plan:   Problem List Items Addressed This Visit       Other   Snores   Healthcare maintenance - Primary   Other Visit Diagnoses     Rhus dermatitis       Relevant Medications   triamcinolone ointment (KENALOG) 0.5 %   Flu vaccine need       Relevant Orders   Flu Vaccine QUAD 6+ mos PF IM (Fluarix Quad PF)        Meds ordered this encounter  Medications   triamcinolone ointment (KENALOG) 0.5 %    Sig: Apply 1 application topically 2 (two) times daily. Not for face or private areas    Dispense:  30 g    Refill:  1    Follow-up: No follow-ups on file.    Mliss Sax, MD

## 2020-11-17 DIAGNOSIS — G4733 Obstructive sleep apnea (adult) (pediatric): Secondary | ICD-10-CM | POA: Diagnosis not present

## 2020-11-23 ENCOUNTER — Telehealth: Payer: Self-pay | Admitting: *Deleted

## 2020-11-23 DIAGNOSIS — Z01818 Encounter for other preprocedural examination: Secondary | ICD-10-CM | POA: Diagnosis not present

## 2020-11-23 NOTE — Telephone Encounter (Signed)
   Name: [name] [name]  DOB: 1965/08/23  MRN: [id number]   Primary Cardiologist: [name], MD  Chart reviewed as part of pre-operative protocol coverage. Patient was contacted 11/23/2020 in reference to pre-operative risk assessment for pending surgery as outlined below.  [name] [name] was last seen on 09/29/2020 by Nada Boozer NP.  Since that day, [name] [name] has done well without chest pain or worsening dyspnea.  Therefore, based on ACC/AHA guidelines, the patient would be at acceptable risk for the planned procedure without further cardiovascular testing.   Patient has been instructed to hold Brilinta for 5 days prior to the procedure and restart it as soon as possible afterward at the surgeon's discretion.  The patient was advised that if he develops new symptoms prior to surgery to contact our office to arrange for a follow-up visit, and he verbalized understanding.  I will route this recommendation to the requesting party via Epic fax function and remove from pre-op pool. Please call with questions.  [place], [place] 11/23/2020, 2:25 PM

## 2020-11-23 NOTE — Telephone Encounter (Signed)
   [facility] Pre-operative Risk Assessment    Patient Name: [name] [name]  DOB: 1966/01/25 MRN: [id number]    Request for surgical clearance:  What type of surgery is being performed? Colonoscopy  (screening/family h/o colon ca  When is this surgery scheduled? 12/01/20  What type of clearance is required (medical clearance vs. Pharmacy clearance to hold med vs. Both)?medical   Are there any medications that need to be held prior to surgery and how long? Brillinta  60 mg  Practice name and name of physician performing surgery? [facility]; Dr Earma Reading  What is the office phone number? [phone]   7.   What is the office fax number?  [phone]  8.   Anesthesia type (None, local, MAC, general) ? Propofol    [name] [name] 11/23/2020, 12:18 PM  _________________________________________________________________   (provider comments below)

## 2020-11-23 NOTE — Telephone Encounter (Signed)
Left a message for the patient to call back and speak to the on-call preop APP of the day 

## 2020-12-01 DIAGNOSIS — D122 Benign neoplasm of ascending colon: Secondary | ICD-10-CM | POA: Diagnosis not present

## 2020-12-01 DIAGNOSIS — Z86010 Personal history of colonic polyps: Secondary | ICD-10-CM | POA: Diagnosis not present

## 2020-12-01 DIAGNOSIS — D123 Benign neoplasm of transverse colon: Secondary | ICD-10-CM | POA: Diagnosis not present

## 2020-12-18 DIAGNOSIS — G4733 Obstructive sleep apnea (adult) (pediatric): Secondary | ICD-10-CM | POA: Diagnosis not present

## 2020-12-18 DIAGNOSIS — M5020 Other cervical disc displacement, unspecified cervical region: Secondary | ICD-10-CM | POA: Diagnosis not present

## 2020-12-19 DIAGNOSIS — M5020 Other cervical disc displacement, unspecified cervical region: Secondary | ICD-10-CM | POA: Diagnosis not present

## 2020-12-23 DIAGNOSIS — M5020 Other cervical disc displacement, unspecified cervical region: Secondary | ICD-10-CM | POA: Diagnosis not present

## 2020-12-29 DIAGNOSIS — E785 Hyperlipidemia, unspecified: Secondary | ICD-10-CM | POA: Diagnosis not present

## 2020-12-29 DIAGNOSIS — I2510 Atherosclerotic heart disease of native coronary artery without angina pectoris: Secondary | ICD-10-CM | POA: Diagnosis not present

## 2020-12-29 DIAGNOSIS — I2102 ST elevation (STEMI) myocardial infarction involving left anterior descending coronary artery: Secondary | ICD-10-CM | POA: Diagnosis not present

## 2020-12-29 LAB — COMPREHENSIVE METABOLIC PANEL
ALT: 23 IU/L (ref 0–44)
AST: 15 IU/L (ref 0–40)
Albumin/Globulin Ratio: 2.4 — ABNORMAL HIGH (ref 1.2–2.2)
Albumin: 4.8 g/dL (ref 3.8–4.9)
Alkaline Phosphatase: 89 IU/L (ref 44–121)
BUN/Creatinine Ratio: 15 (ref 9–20)
BUN: 15 mg/dL (ref 6–24)
Bilirubin Total: 0.7 mg/dL (ref 0.0–1.2)
CO2: 26 mmol/L (ref 20–29)
Calcium: 10 mg/dL (ref 8.7–10.2)
Chloride: 104 mmol/L (ref 96–106)
Creatinine, Ser: 1 mg/dL (ref 0.76–1.27)
Globulin, Total: 2 g/dL (ref 1.5–4.5)
Glucose: 103 mg/dL — ABNORMAL HIGH (ref 70–99)
Potassium: 4.7 mmol/L (ref 3.5–5.2)
Sodium: 142 mmol/L (ref 134–144)
Total Protein: 6.8 g/dL (ref 6.0–8.5)
eGFR: 89 mL/min/{1.73_m2} (ref >59)

## 2020-12-29 LAB — LIPID PANEL
Chol/HDL Ratio: 2.5 ratio (ref 0.0–5.0)
Cholesterol, Total: 84 mg/dL — ABNORMAL LOW (ref 100–199)
HDL: 34 mg/dL — ABNORMAL LOW (ref >39)
LDL Chol Calc (NIH): 36 mg/dL (ref 0–99)
Triglycerides: 59 mg/dL (ref 0–149)
VLDL Cholesterol Cal: 14 mg/dL (ref 5–40)

## 2020-12-30 DIAGNOSIS — M5127 Other intervertebral disc displacement, lumbosacral region: Secondary | ICD-10-CM | POA: Diagnosis not present

## 2020-12-30 DIAGNOSIS — M5020 Other cervical disc displacement, unspecified cervical region: Secondary | ICD-10-CM | POA: Diagnosis not present

## 2021-01-01 DIAGNOSIS — M5020 Other cervical disc displacement, unspecified cervical region: Secondary | ICD-10-CM | POA: Diagnosis not present

## 2021-01-03 NOTE — Progress Notes (Signed)
Chemistry panel stable.  Normal kidney/liver function and electrolytes.  Blood sugars are little bit high but stable.  Cholesterol is back to where was a year ago.  LDL is back down to 36 with a pressure of 84.  HDL is actually better at 34 with triglycerides 59.  Well-controlled.  Continue current meds. [name], MD

## 2021-01-04 ENCOUNTER — Ambulatory Visit (INDEPENDENT_AMBULATORY_CARE_PROVIDER_SITE_OTHER): Payer: BC Managed Care – PPO | Admitting: Cardiology

## 2021-01-04 ENCOUNTER — Other Ambulatory Visit: Payer: Self-pay

## 2021-01-04 ENCOUNTER — Encounter: Payer: Self-pay | Admitting: Cardiology

## 2021-01-04 VITALS — BP 92/70 | HR 58 | Ht 67.0 in | Wt 179.0 lb

## 2021-01-04 DIAGNOSIS — Z789 Other specified health status: Secondary | ICD-10-CM

## 2021-01-04 DIAGNOSIS — Z9861 Coronary angioplasty status: Secondary | ICD-10-CM

## 2021-01-04 DIAGNOSIS — I255 Ischemic cardiomyopathy: Secondary | ICD-10-CM

## 2021-01-04 DIAGNOSIS — E785 Hyperlipidemia, unspecified: Secondary | ICD-10-CM

## 2021-01-04 DIAGNOSIS — I2510 Atherosclerotic heart disease of native coronary artery without angina pectoris: Secondary | ICD-10-CM

## 2021-01-04 DIAGNOSIS — I10 Essential (primary) hypertension: Secondary | ICD-10-CM | POA: Diagnosis not present

## 2021-01-04 DIAGNOSIS — I2102 ST elevation (STEMI) myocardial infarction involving left anterior descending coronary artery: Secondary | ICD-10-CM

## 2021-01-04 DIAGNOSIS — R29898 Other symptoms and signs involving the musculoskeletal system: Secondary | ICD-10-CM

## 2021-01-04 MED ORDER — CARVEDILOL 3.125 MG PO TABS: 3.1250 mg | ORAL_TABLET | Freq: Two times a day (BID) | ORAL | 3 refills | Status: DC

## 2021-01-04 NOTE — Progress Notes (Signed)
Primary Care Provider: Mliss Sax, MD Cardiologist: [name], MD Electrophysiologist: None  Clinic Note: Chief Complaint  Patient presents with   Follow-up    91-month.  Overall doing well.   Coronary Artery Disease    No further angina   ===================================  ASSESSMENT/PLAN   Problem List Items Addressed This Visit       Cardiology Problems   CAD S/P percutaneous coronary angioplasty - Primary (Chronic)    Extensive LAD PCI along with OM 2.  With extensive stents, would like for him to be on antiplatelet agent.  We stopped aspirin last visit.  Plan:  Continue maintenance dose Brilinta 60 mg twice daily Okay to hold Brilinta 5-7 days preop for surgeries or procedures. Continue current dose of statin for now. ->  Could consider stopping Zetia. With borderline hypotension will reduce carvedilol to 3.125 mg twice daily (since he reduces the daytime dose back to 3.125 mg. Already on low-dose losartan-12.5 mg (1/2 tablet of 2500 daily. Okay to stop Imdur.      Relevant Medications   carvedilol (COREG) 3.125 MG tablet   Ischemic cardiomyopathy (Chronic)    Resolved by most recent echo.  EF at 55 to 60%.  Cardiac cath EF by LV gram to be 50 to 55%.Euvolemic on exam, with no signs or symptoms of HFpEF. Associated symptoms.  He is on carvedilol and losartan at low doses.  Blood pressure not tolerating.        Relevant Medications   carvedilol (COREG) 3.125 MG tablet   Hyperlipidemia LDL goal <70 (Chronic)    Labs just checked--all levels at goal with exception of HDL 36.  Occult pain with total cholesterol of 84.  LDL 36 on 40 mg atorvastatin.  For now continue current dose, low threshold to consider dropping back to 20 mg      Relevant Medications   carvedilol (COREG) 3.125 MG tablet   Essential hypertension (Chronic)    Not really hypertensive anymore.  Only able to tolerate losartan 12.5 mg, and now we are reducing carvedilol to 3.125 mg  twice daily. Stopping Imdur.  Continues to have symptoms of hypotension, would hold losartan.      Relevant Medications   carvedilol (COREG) 3.125 MG tablet     Other   Leg fatigue    ABIs look normal.  Not likely to be related to PE.  Not really complaining now.  Continue to monitor.  Does not sound like statin myopathy.      Medication intolerance    Not tolerate high doses of medications.  Backing down on beta-blocker and discontinue nitrate.  Seems to be tolerating atorvastatin relatively well.       ===================================  HPI:    [name] [name] is a 55 y.o. male with a PMH Notable for Multivessel CAD-PCI with recovered ICM (anterior STEMI April 2021) & recent re-look Cardiac Cath to evaluate CP who presents today for 46-month follow-up.  Anterior STEMI 05/2019 -> EF was 35 to 40%  MV CAD-2-vessel PCI: 3 overlapping DES p-mLAD, 1 DES OM2, CTO of  rPDA w/ L-R collaterals - med Rx.   Follow-Up Echo EF 55 to 60% on- medical therapy. Also has a HTN, HLD  I last saw [name] [name] on May 25, 2020 roughly 1 year out from his MI he was doing relatively well.  Occasionally has some shortness of breath at night No orthopnea or PND.  No exertional chest tightness/pressure or dyspnea.  No heart failure symptoms.  Just intermittent fatigue.  Sleep study evaluation pending also noted leg fatigue.  Recent Hospitalizations:  Admitted 08/19/20 with waxing and waning chest pain.  + associated with SOB.  Ruled out for MI.  Cath showed patent stents with no RPDA occlusion with collaterals.  Consideration of nonanginal causes for chest pain though does have occluded PDA EF 50-55% Imdur and zetia added at discharge. But no refills given.  Plan was to recheck hepatic and lipid panel and follow-up (unfortunately, he has been out of his medications for 2 weeks.  He was most recently seen for hospital follow-up by Cecilie Kicks, NP on 09/29/2020.  Did not already further chest  pain and no change from his usual dyspnea.  Had run out of Zetia and Imdur. Refills for Zetia and Imdur provided.  Plan was to follow-up labs prior to this visit.  Reviewed  CV studies:    The following studies were reviewed today: (if available, images/films reviewed: From Epic Chart or Care Everywhere) ABIs/LEA Dopplers 06/04/2020: R ABI 1.1 and TBI 0.78, L ABI 0.12 and TBI 0.79-normal indices.  No evidence of lower extremity arterial disease. Cath 08/20/2020: rPDA 100% after high bifurcation -bridging, R-R and L-R collaterals noted.  Widely patent proximal and distal LAD stents with~5% in-stent stenosis.  Widely patent OM2 stent followed by 30% stenosis..  Low normal EF 50 to 55%.  Normal EDP.    Interval History:   [name] [name] returns today overall doing pretty well.  No more symptoms of chest pain.  He thinks it may just be the hot weather that brings on the symptoms.  No CHF symptoms orthopnea or PND.  Off-and-on baseline spells of shortness of breath, but no change from before.  He does have some lightheadedness on occasion.  Usually with standing up.  He also notes his stamina is just never come back since COVID.  CV Review of Symptoms (Summary) Cardiovascular ROS: positive for - -baseline exertional dyspnea with mild musculoskeletal type chest pains. negative for - edema, loss of consciousness, orthopnea, palpitations, paroxysmal nocturnal dyspnea, rapid heart rate, shortness of breath, or lightheadedness or dizziness, wooziness, syncope/near syncope or TIA/amaurosis fugax.  REVIEWED OF SYSTEMS   Review of Systems  Constitutional:  Positive for malaise/fatigue (A little bit of deconditioning and fatigue with reduced stamina.). Negative for weight loss.  HENT:  Negative for congestion and nosebleeds.   Respiratory:  Positive for shortness of breath (No real change from baseline).   Cardiovascular:        Per HPI  Gastrointestinal:  Negative for blood in stool and melena.   Genitourinary:  Negative for hematuria.  Musculoskeletal:  Positive for joint pain. Negative for falls and myalgias.  Neurological:  Positive for dizziness (Occasionally gets dizzy-often positional). Negative for focal weakness and weakness.  Psychiatric/Behavioral:  Negative for memory loss. The patient does not have insomnia.    I have reviewed and (if needed) personally updated the patient's problem list, medications, allergies, past medical and surgical history, social and family history.   PAST MEDICAL HISTORY   Past Medical History:  Diagnosis Date   History of acute anterior wall MI 05/29/2019   100% LAD after SP1 -> (long lesion); 90% prox OM 2 (mod LPL 60%), ~100% CTO of RPDA (high bifurcation, fills via L-R collateral flow) - 3 overlapping DES in LAD & 1 in OM2   Hyperlipidemia LDL goal <70    Multiple vessel coronary artery disease 05/29/2019   Cath 05/29/2019 : Anterior STEMI -> Severe Three-Vessel  Disease:  CULPRIT LESION 100% LAD after SP1 (long); 90% pOM 2 (with mod LPL), ~100 CTO of RPDA (high bifurcation, L-R collateral); Successful DES PCI p-mLAD w/ 3 overlapping Resolute Onyx DES (3.0 x 12, 2.5 x 38, & 2.25 x 15 --> 3.65-2.8 mm), OM2 Resolute Onyx DES 2 x 12 - 2.2 mm.     PAST SURGICAL HISTORY   Past Surgical History:  Procedure Laterality Date   CORONARY STENT INTERVENTION N/A 05/29/2019   Procedure: CORONARY STENT INTERVENTION;  Surgeon: Leonie Man, MD;  Location: [place];  Service: Cardiovascular;; 2nd LESION: OM 2 with Resolute Onyx DES 2.0 mm x 12 mm--postdilated to 2.2 mm)   CORONARY/GRAFT ACUTE MI REVASCULARIZATION N/A 05/29/2019   Procedure: CORONARY/GRAFT ACUTE MI REVASCULARIZATION;  Surgeon: Leonie Man, MD;  Location: [place] [facility];  Service: Cardiovascular;  Successful DES PCI of extensive proximal to mid LAD using 3 overlapping Resolute Onyx DES stents (3.0 mm x 12 mm, 2.5 mm x 38 mm, and 2.25 mm x 15 mm -> entire segment  postdilated from 3.65 mm down to 2.8 mm)    LEFT HEART CATH AND CORONARY ANGIOGRAPHY N/A 05/29/2019   Procedure: LEFT HEART CATH AND CORONARY ANGIOGRAPHY;  Surgeon: Leonie Man, MD;  Location: [place] [facility];  Service: Cardiovascular;; CULPRIT LESION 100% LAD after SP1 (long lesion); 90% proximal OM 2 (with moderate LPL), 100% CTO of RPDA (high bifurcation, fills via L-R collaterals; EF ~35-40% mid-apical Hypo-AK. LVEDP 22 mmHg.   LEFT HEART CATH AND CORONARY ANGIOGRAPHY N/A 08/20/2020   Procedure: LEFT HEART CATH AND CORONARY ANGIOGRAPHY;  Surgeon: Leonie Man, MD;  Location: [facility];  Service: Cardiovascular; widely patent overlapping stents in the LAD with 95% ISR.  Widely patent LAD stent followed by 30% stenosis.  RPDA 100% occluded after high bifurcation with bridging, or-R and L and R collaterals.  EF 50 to 55%.  Normal EDP.   TRANSTHORACIC ECHOCARDIOGRAM  09/2019   EF ~ 55-60%, Mid-apical Inferoseptal HK & AK of apical and anteroseptal wall.  Gr II DD.  Normal RV size and function.  Mild aortic sclerosis.   TRANSTHORACIC ECHOCARDIOGRAM  05/2019    (Anterior STEMI) --> EF 35 to 40%.  Entire anterior wall, mid and distal anterior septum and apex are akinetic.  Mild LVH, GR 1 DD.    Immunization History  Administered Date(s) Administered   Influenza,inj,Quad PF,6+ Mos 01/05/2020, 11/13/2020   Moderna Sars-Covid-2 Vaccination 06/26/2019, 07/07/2019, 08/10/2019   Pneumococcal Polysaccharide-23 05/14/2020    MEDICATIONS/ALLERGIES   Current Meds  Medication Sig   atorvastatin (LIPITOR) 40 MG tablet Take 40 mg by mouth daily.   carvedilol (COREG) 3.125 MG tablet Take 1 tablet (3.125 mg total) by mouth 2 (two) times daily.   ezetimibe (ZETIA) 10 MG tablet Take 1 tablet (10 mg total) by mouth daily.   losartan (COZAAR) 25 MG tablet TAKE 1/2 TABLET BY MOUTH EVERY DAY   nitroGLYCERIN (NITROSTAT) 0.4 MG SL tablet Place 1 tablet (0.4 mg total) under the tongue every 5  (five) minutes as needed for chest pain (up to 3 doses. If taking 3rd dose, call 911.).   ticagrelor (BRILINTA) 60 MG TABS tablet Take 1 tablet (60 mg total) by mouth 2 (two) times daily.   triamcinolone ointment (KENALOG) 0.5 % Apply 1 application topically 2 (two) times daily. Not for face or private areas   [DISCONTINUED] carvedilol (COREG) 6.25 MG tablet Take 3.125mg  (half tablet of 6.25) in the  morning and 6.25mg  (whole tablet) in the evening.   [DISCONTINUED] isosorbide mononitrate (IMDUR) 30 MG 24 hr tablet Take 0.5 tablets (15 mg total) by mouth daily.    No Known Allergies  SOCIAL HISTORY/FAMILY HISTORY   Reviewed in Epic:  Pertinent findings:  Social History   Tobacco Use   Smoking status: Never   Smokeless tobacco: Never  Vaping Use   Vaping Use: Never used  Substance Use Topics   Alcohol use: Never   Drug use: Never   Social History   Social History Narrative   Lives with his wife in [place].      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    OBJCTIVE -PE, EKG, labs   Wt Readings from Last 3 Encounters:  01/04/21 179 lb (81.2 kg)  11/13/20 179 lb 6.4 oz (81.4 kg)  09/29/20 179 lb 6.4 oz (81.4 kg)    Physical Exam: BP 92/70 (BP Location: Left Arm)   Pulse (!) 58   Ht 5\' 7"  (1.702 m)   Wt 179 lb (81.2 kg)   SpO2 96%   BMI 28.04 kg/m  Physical Exam Vitals reviewed.  Constitutional:      General: He is not in acute distress.    Appearance: He is normal weight. He is ill-appearing. He is not toxic-appearing.  HENT:     Head: Normocephalic and atraumatic.  Neck:     Vascular: No carotid bruit.  Cardiovascular:     Rate and Rhythm: Normal rate and regular rhythm.     Pulses: Normal pulses.     Heart sounds: Normal heart sounds. No murmur heard.   No friction rub. No gallop.  Pulmonary:     Effort: Pulmonary effort is normal. No respiratory distress.     Breath sounds: Normal breath sounds.  Chest:     Chest wall: No  tenderness.  Musculoskeletal:        General: No swelling. Normal range of motion.     Cervical back: Normal range of motion and neck supple.  Skin:    General: Skin is warm and dry.  Neurological:     General: No focal deficit present.     Mental Status: He is alert and oriented to person, place, and time.     Gait: Gait normal.  Psychiatric:        Mood and Affect: Mood normal.        Behavior: Behavior normal.        Thought Content: Thought content normal.        Judgment: Judgment normal.     Adult ECG Report Not checked  Recent Labs: Reviewed Lab Results  Component Value Date   CHOL 84 (L) 12/29/2020   HDL 34 (L) 12/29/2020   LDLCALC 36 12/29/2020   TRIG 59 12/29/2020   CHOLHDL 2.5 12/29/2020   Lab Results  Component Value Date   CREATININE 1.00 12/29/2020   BUN 15 12/29/2020   NA 142 12/29/2020   K 4.7 12/29/2020   CL 104 12/29/2020   CO2 26 12/29/2020   CBC Latest Ref Rng & Units 08/20/2020 08/19/2020 05/14/2020  WBC 4.0 - 10.5 K/uL 6.9 7.7 7.7  Hemoglobin 13.0 - 17.0 g/dL 15.8 15.4 15.8  Hematocrit 39.0 - 52.0 % 46.5 44.2 46.4  Platelets 150 - 400 K/uL 245 255 238.0    Lab Results  Component Value Date   HGBA1C 5.6 08/20/2020   Lab Results  Component Value Date  TSH 1.221 05/29/2019    ==================================================  COVID-19 Education: The signs and symptoms of COVID-19 were discussed with the patient and how to seek care for testing (follow up with PCP or arrange E-visit).    I spent a total of 16 minutes with the patient spent in direct patient consultation.  Additional time spent with chart review  / charting (studies, outside notes, etc): 15 min Total Time: 31 min  Current medicines are reviewed at length with the patient today.  (+/- concerns) none  This visit occurred during the SARS-CoV-2 public health emergency.  Safety protocols were in place, including screening questions prior to the visit, additional usage of staff  PPE, and extensive cleaning of exam room while observing appropriate contact time as indicated for disinfecting solutions.  Notice: This dictation was prepared with Dragon dictation along with smart phrase technology. Any transcriptional errors that result from this process are unintentional and may not be corrected upon review.  Studies Ordered:   No orders of the defined types were placed in this encounter.   Patient Instructions / Medication Changes & Studies & Tests Ordered   Patient Instructions  Medication Instructions:   Decrease  Carvedilol 3.125 mg  twice a day    Stop taking (Isosorbide Mononitrate) Imdur   If you are dizzy , woozy in the morning - can hold for  that today    *If you need a refill on your cardiac medications before your next appointment, please call your pharmacy*   Lab Work:  Not needed   Testing/Procedures: Not needed   Follow-Up: At [facility], you and your health needs are our priority.  As part of our continuing mission to provide you with exceptional heart care, we have created designated Provider Care Teams.  These Care Teams include your primary Cardiologist (physician) and Advanced Practice Providers (APPs -  Physician Assistants and Nurse Practitioners) who all work together to provide you with the care you need, when you need it.     Your next appointment:   5 month(s)  The format for your next appointment:   In Person  Provider:   Glenetta Hew, MD       Glenetta Hew, M.D., M.S. Interventional Cardiologist   Pager # [phone] Phone # [phone] [address] [place] [employer], [place] [address]   Thank you for choosing Heartcare at [facility]!!

## 2021-01-04 NOTE — Patient Instructions (Addendum)
Medication Instructions:   Decrease  Carvedilol 3.125 mg  twice a day    Stop taking (Isosorbide Mononitrate) Imdur   If you are dizzy , woozy in the morning - can hold for  that today    *If you need a refill on your cardiac medications before your next appointment, please call your pharmacy*   Lab Work:  Not needed   Testing/Procedures: Not needed   Follow-Up: At [facility], you and your health needs are our priority.  As part of our continuing mission to provide you with exceptional heart care, we have created designated Provider Care Teams.  These Care Teams include your primary Cardiologist (physician) and Advanced Practice Providers (APPs -  Physician Assistants and Nurse Practitioners) who all work together to provide you with the care you need, when you need it.     Your next appointment:   5 month(s)  The format for your next appointment:   In Person  Provider:   [name], MD

## 2021-01-17 DIAGNOSIS — G4733 Obstructive sleep apnea (adult) (pediatric): Secondary | ICD-10-CM | POA: Diagnosis not present

## 2021-01-18 ENCOUNTER — Ambulatory Visit: Payer: BC Managed Care – PPO | Admitting: Cardiology

## 2021-01-25 ENCOUNTER — Encounter: Payer: Self-pay | Admitting: Cardiology

## 2021-01-25 NOTE — Assessment & Plan Note (Signed)
ABIs look normal.  Not likely to be related to PE.  Not really complaining now.  Continue to monitor.  Does not sound like statin myopathy.

## 2021-01-25 NOTE — Assessment & Plan Note (Signed)
Labs just checked--all levels at goal with exception of HDL 36.  Occult pain with total cholesterol of 84.  LDL 36 on 40 mg atorvastatin.  For now continue current dose, low threshold to consider dropping back to 20 mg

## 2021-01-25 NOTE — Assessment & Plan Note (Signed)
Not really hypertensive anymore.  Only able to tolerate losartan 12.5 mg, and now we are reducing carvedilol to 3.125 mg twice daily. Stopping Imdur.  Continues to have symptoms of hypotension, would hold losartan.

## 2021-01-25 NOTE — Assessment & Plan Note (Signed)
Not tolerate high doses of medications.  Backing down on beta-blocker and discontinue nitrate.  Seems to be tolerating atorvastatin relatively well.

## 2021-01-25 NOTE — Assessment & Plan Note (Signed)
Resolved by most recent echo.  EF at 55 to 60%.  Cardiac cath EF by LV gram to be 50 to 55%.Euvolemic on exam, with no signs or symptoms of HFpEF. Associated symptoms.  He is on carvedilol and losartan at low doses.  Blood pressure not tolerating.

## 2021-01-25 NOTE — Assessment & Plan Note (Signed)
Extensive LAD PCI along with OM 2.  With extensive stents, would like for him to be on antiplatelet agent.  We stopped aspirin last visit.  Plan:   Continue maintenance dose Brilinta 60 mg twice daily  Okay to hold Brilinta 5-7 days preop for surgeries or procedures.  Continue current dose of statin for now. ->  Could consider stopping Zetia.  With borderline hypotension will reduce carvedilol to 3.125 mg twice daily (since he reduces the daytime dose back to 3.125 mg.  Already on low-dose losartan-12.5 mg (1/2 tablet of 2500 daily.  Okay to stop Imdur.

## 2021-02-24 DIAGNOSIS — R0981 Nasal congestion: Secondary | ICD-10-CM | POA: Diagnosis not present

## 2021-02-24 DIAGNOSIS — R051 Acute cough: Secondary | ICD-10-CM | POA: Diagnosis not present

## 2021-02-24 DIAGNOSIS — R509 Fever, unspecified: Secondary | ICD-10-CM | POA: Diagnosis not present

## 2021-02-24 DIAGNOSIS — Z20828 Contact with and (suspected) exposure to other viral communicable diseases: Secondary | ICD-10-CM | POA: Diagnosis not present

## 2021-05-13 ENCOUNTER — Encounter: Payer: Self-pay | Admitting: Family Medicine

## 2021-05-13 ENCOUNTER — Ambulatory Visit: Payer: BC Managed Care – PPO | Admitting: Family Medicine

## 2021-05-13 VITALS — BP 110/68 | HR 48 | Temp 97.4°F | Wt 173.2 lb

## 2021-05-13 DIAGNOSIS — H6123 Impacted cerumen, bilateral: Secondary | ICD-10-CM

## 2021-05-13 DIAGNOSIS — Z0001 Encounter for general adult medical examination with abnormal findings: Secondary | ICD-10-CM | POA: Diagnosis not present

## 2021-05-13 DIAGNOSIS — Z23 Encounter for immunization: Secondary | ICD-10-CM | POA: Diagnosis not present

## 2021-05-13 DIAGNOSIS — Z86010 Personal history of colonic polyps: Secondary | ICD-10-CM | POA: Insufficient documentation

## 2021-05-13 DIAGNOSIS — Z1159 Encounter for screening for other viral diseases: Secondary | ICD-10-CM | POA: Diagnosis not present

## 2021-05-13 DIAGNOSIS — Z800 Family history of malignant neoplasm of digestive organs: Secondary | ICD-10-CM | POA: Insufficient documentation

## 2021-05-13 DIAGNOSIS — Z0000 Encounter for general adult medical examination without abnormal findings: Secondary | ICD-10-CM

## 2021-05-13 LAB — CBC
HCT: 44.7 % (ref 39.0–52.0)
Hemoglobin: 15.3 g/dL (ref 13.0–17.0)
MCHC: 34.2 g/dL (ref 30.0–36.0)
MCV: 92.1 fl (ref 78.0–100.0)
Platelets: 250 10*3/uL (ref 150.0–400.0)
RBC: 4.85 Mil/uL (ref 4.22–5.81)
RDW: 12.8 % (ref 11.5–15.5)
WBC: 7.2 10*3/uL (ref 4.0–10.5)

## 2021-05-13 LAB — LIPID PANEL
Cholesterol: 87 mg/dL (ref 0–200)
HDL: 36 mg/dL — ABNORMAL LOW (ref >39.00)
LDL Cholesterol: 42 mg/dL (ref 0–99)
NonHDL: 50.75
Total CHOL/HDL Ratio: 2
Triglycerides: 45 mg/dL (ref 0.0–149.0)
VLDL: 9 mg/dL (ref 0.0–40.0)

## 2021-05-13 LAB — BASIC METABOLIC PANEL
BUN: 12 mg/dL (ref 6–23)
CO2: 27 mEq/L (ref 19–32)
Calcium: 9.8 mg/dL (ref 8.4–10.5)
Chloride: 105 mEq/L (ref 96–112)
Creatinine, Ser: 0.91 mg/dL (ref 0.40–1.50)
GFR: 95.24 mL/min (ref >60.00)
Glucose, Bld: 100 mg/dL — ABNORMAL HIGH (ref 70–99)
Potassium: 4.2 mEq/L (ref 3.5–5.1)
Sodium: 140 mEq/L (ref 135–145)

## 2021-05-13 LAB — PSA: PSA: 0.49 ng/mL (ref 0.10–4.00)

## 2021-05-13 LAB — HEPATIC FUNCTION PANEL
ALT: 23 U/L (ref 0–53)
AST: 17 U/L (ref 0–37)
Albumin: 4.6 g/dL (ref 3.5–5.2)
Alkaline Phosphatase: 72 U/L (ref 39–117)
Bilirubin, Direct: 0.2 mg/dL (ref 0.0–0.3)
Total Bilirubin: 0.8 mg/dL (ref 0.2–1.2)
Total Protein: 6.8 g/dL (ref 6.0–8.3)

## 2021-05-13 LAB — URINALYSIS, ROUTINE W REFLEX MICROSCOPIC
Bilirubin Urine: NEGATIVE
Hgb urine dipstick: NEGATIVE
Ketones, ur: NEGATIVE
Leukocytes,Ua: NEGATIVE
Nitrite: NEGATIVE
RBC / HPF: NONE SEEN (ref 0–?)
Specific Gravity, Urine: >=1.03 — AB (ref 1.000–1.030)
Total Protein, Urine: NEGATIVE
Urine Glucose: NEGATIVE
Urobilinogen, UA: 0.2 (ref 0.0–1.0)
pH: 5.5 (ref 5.0–8.0)

## 2021-05-13 NOTE — Progress Notes (Signed)
? ?Established Patient Office Visit ? ?Subjective:  ?Patient ID: [name] [name], male    DOB: [birth year]  Age: 56 y.o. MRN: [id number] ? ?CC:  ?Chief Complaint  ?Patient presents with  ? Follow-up  ?  Routine follow up, concerns about frequent tinnitus, eyes seem to look yellow pre wife.   ? ? ?HPI ?[name] [name] presents for health check and routine follow-up.  Reports tinnitus in the left ear.  Abrupt hearing loss left ear.  Status post consultation x2.  Headaches for dizziness.  History of cerumen gnosis.  He does not use Q-tips.  Successful ear irrigation at home.  He is quite active on his job and also works side jobs. ? ?Past Medical History:  ?Diagnosis Date  ? History of acute anterior wall MI 05/29/2019  ? 100% LAD after SP1 -> (long lesion); 90% prox OM 2 (mod LPL 60%), ~100% CTO of RPDA (high bifurcation, fills via L-R collateral flow) - 3 overlapping DES in LAD & 1 in OM2  ? Hyperlipidemia LDL goal <70   ? 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 Cath 05/29/2019 : Anterior STEMI -> Severe Three-Vessel Disease:  CULPRIT LESION 100% LAD after SP1 (long); 90% pOM 2 (with mod LPL), ~100 CTO of RPDA (high bifurcation, L-R collateral); Successful DES PCI p-mLAD w/ 3 overlapping Resolute Onyx DES (3.0 x 12, 2.5 x 38, & 2.25 x 15 --> 3.65-2.8 mm), OM2 Resolute Onyx DES 2 x 12 - 2.2 mm.   ? ? ?Past Surgical History:  ?Procedure Laterality Date  ? CORONARY STENT INTERVENTION N/A 05/29/2019  ? Procedure: CORONARY STENT INTERVENTION;  Surgeon: Marykay Lex, MD;  Location: [facility] INVASIVE CV LAB;  Service: Cardiovascular;; 2nd LESION: OM 2 with Resolute Onyx DES 2.0 mm x 12 mm--postdilated to 2.2 mm)  ? CORONARY/GRAFT ACUTE MI REVASCULARIZATION N/A 05/29/2019  ? Procedure: CORONARY/GRAFT ACUTE MI REVASCULARIZATION;  Surgeon: Marykay Lex, MD;  Location: [facility] INVASIVE CV LAB;  Service: Cardiovascular;  Successful DES PCI of extensive proximal to mid LAD using 3 overlapping Resolute Onyx DES  stents (3.0 mm x 12 mm, 2.5 mm x 38 mm, and 2.25 mm x 15 mm -> entire segment postdilated from 3.65 mm down to 2.8 mm)   ? LEFT HEART CATH AND CORONARY ANGIOGRAPHY N/A 05/29/2019  ? Procedure: LEFT HEART CATH AND CORONARY ANGIOGRAPHY;  Surgeon: Marykay Lex, MD;  Location: [facility] INVASIVE CV LAB;  Service: Cardiovascular;; CULPRIT LESION 100% LAD after SP1 (long lesion); 90% proximal OM 2 (with moderate LPL), 100% CTO of RPDA (high bifurcation, fills via L-R collaterals; EF ~35-40% mid-apical Hypo-AK. LVEDP 22 mmHg.  ? LEFT HEART CATH AND CORONARY ANGIOGRAPHY N/A 08/20/2020  ? Procedure: LEFT HEART CATH AND CORONARY ANGIOGRAPHY;  Surgeon: Marykay Lex, MD;  Location: [facility] INVASIVE CV LAB;  Service: Cardiovascular; widely patent overlapping stents in the LAD with 95% ISR.  Widely patent LAD stent followed by 30% stenosis.  RPDA 100% occluded after high bifurcation with bridging, or-R and L and R collaterals.  EF 50 to 55%.  Normal EDP.  ? TRANSTHORACIC ECHOCARDIOGRAM  09/2019  ? EF ~ 55-60%, Mid-apical Inferoseptal HK & AK of apical and anteroseptal wall.  Gr II DD.  Normal RV size and function.  Mild aortic sclerosis.  ? TRANSTHORACIC ECHOCARDIOGRAM  05/2019  ?  (Anterior STEMI) --> EF 35 to 40%.  Entire anterior wall, mid and distal anterior septum and apex are akinetic.  Mild LVH, GR 1 DD.  ? ? ?  Family History  ?Problem Relation Age of Onset  ? Hypertension Mother   ? ? ?Social History  ? ?Socioeconomic History  ? Marital status: Married  ?  Spouse name: Not on file  ? Number of children: Not on file  ? Years of education: 512  ? Highest education level: High school graduate  ?Occupational History  ? 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  Employer: [employer]  ?Tobacco Use  ? Smoking status: Never  ? Smokeless tobacco: Never  ?Vaping Use  ? Vaping Use: Never used  ?Substance and Sexual Activity  ? Alcohol use: Never  ? Drug use: Never  ? Sexual activity: Yes  ?Other Topics Concern  ? Not on file   ?Social History Narrative  ? [place]  ?   ? 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  ? ?Social Determinants of Health  ? ?Financial Resource Strain: Not on file  ?Food Insecurity: Not on file  ?Transportation Needs: Not on file  ?Physical Activity: Not on file  ?Stress: Not on file  ?Social Connections: Not on file  ?Intimate Partner Violence: Not on file  ? ? ?Outpatient Medications Prior to Visit  ?Medication Sig Dispense Refill  ? atorvastatin (LIPITOR) 40 MG tablet Take 40 mg by mouth daily.    ? carvedilol (COREG) 3.125 MG tablet Take 1 tablet (3.125 mg total) by mouth 2 (two) times daily. 180 tablet 3  ? ezetimibe (ZETIA) 10 MG tablet Take 1 tablet (10 mg total) by mouth daily. 90 tablet 3  ? losartan (COZAAR) 25 MG tablet TAKE 1/2 TABLET BY MOUTH EVERY DAY 45 tablet 3  ? nitroGLYCERIN (NITROSTAT) 0.4 MG SL tablet Place 1 tablet (0.4 mg total) under the tongue every 5 (five) minutes as needed for chest pain (up to 3 doses. If taking 3rd dose, call 911.). 25 tablet 3  ? ticagrelor (BRILINTA) 60 MG TABS tablet Take 1 tablet (60 mg total) by mouth 2 (two) times daily. 180 tablet 3  ? triamcinolone ointment (KENALOG) 0.5 % Apply 1 application topically 2 (two) times daily. Not for face or private areas 30 g 1  ? isosorbide mononitrate (IMDUR) 30 MG 24 hr tablet Take 15 mg by mouth daily.    ? LAGEVRIO 200 MG CAPS capsule Take 4 capsules by mouth 2 (two) times daily.    ? ?No facility-administered medications prior to visit.  ? ? ?No Known Allergies ? ?ROS ?Review of Systems  ?Constitutional:  Negative for chills, diaphoresis, fatigue, fever and unexpected weight change.  ?HENT: Negative.    ?Eyes:  Negative for photophobia and visual disturbance.  ?Respiratory:  Negative for chest tightness and shortness of breath.   ?Cardiovascular:  Negative for chest pain and palpitations.  ?Genitourinary:  Negative for difficulty urinating, frequency and urgency.   ?Musculoskeletal:  Negative for gait problem.  ?Skin: Negative.   ?Neurological:  Negative for dizziness, speech difficulty, weakness, light-headedness and headaches.  ?Psychiatric/Behavioral: Negative.    ? ?  ?Objective:  ?  ?Physical Exam ?Vitals and nursing note reviewed.  ?Constitutional:   ?   General: He is not in acute distress. ?   Appearance: Normal appearance. He is normal weight. He is not ill-appearing, toxic-appearing or diaphoretic.  ?HENT:  ?   Head: Normocephalic and atraumatic.  ?   Right Ear: There is impacted cerumen.  ?   Left Ear: There is impacted cerumen.  ?  Mouth/Throat:  ?   Mouth: Mucous membranes are moist.  ?   Pharynx: Oropharynx is clear. No oropharyngeal exudate or posterior oropharyngeal erythema.  ?Eyes:  ?   General: No scleral icterus.    ?   Right eye: No discharge.     ?   Left eye: No discharge.  ?   Conjunctiva/sclera: Conjunctivae normal.  ?   Pupils: Pupils are equal, round, and reactive to light.  ?Neck:  ?   Vascular: No carotid bruit.  ?Cardiovascular:  ?   Rate and Rhythm: Normal rate and regular rhythm.  ?Pulmonary:  ?   Effort: Pulmonary effort is normal.  ?   Breath sounds: Normal breath sounds. No rhonchi.  ?Abdominal:  ?   General: Bowel sounds are normal.  ?Musculoskeletal:  ?   Cervical back: No rigidity or tenderness.  ?Lymphadenopathy:  ?   Cervical: No cervical adenopathy.  ?Skin: ?   General: Skin is warm and dry.  ?Neurological:  ?   Mental Status: He is alert and oriented to person, place, and time.  ? ? ?BP 110/68 (BP Location: Left Arm, Patient Position: Sitting, Cuff Size: Large)   Pulse (!) 48   Temp (!) 97.4 ?F (36.3 ?C) (Temporal)   Wt 173 lb 3.2 oz (78.6 kg)   SpO2 99%   BMI 27.13 kg/m?  ?Wt Readings from Last 3 Encounters:  ?05/13/21 173 lb 3.2 oz (78.6 kg)  ?01/04/21 179 lb (81.2 kg)  ?11/13/20 179 lb 6.4 oz (81.4 kg)  ? ? ? ?Health Maintenance Due  ?Topic Date Due  ? Hepatitis C Screening  Never done  ? TETANUS/TDAP  Never done  ? Zoster  Vaccines- Shingrix (1 of 2) Never done  ? ? ?There are no preventive care reminders to display for this patient. ? ?Lab Results  ?Component Value Date  ? TSH 1.221 05/29/2019  ? ?Lab Results  ?Component Value Date

## 2021-05-14 LAB — HEPATITIS C ANTIBODY
Hepatitis C Ab: NONREACTIVE
SIGNAL TO CUT-OFF: <0.02 (ref <1.00)

## 2021-05-27 ENCOUNTER — Other Ambulatory Visit: Payer: Self-pay | Admitting: Cardiology

## 2021-06-07 ENCOUNTER — Encounter: Payer: Self-pay | Admitting: Cardiology

## 2021-06-07 ENCOUNTER — Ambulatory Visit: Payer: BC Managed Care – PPO | Admitting: Cardiology

## 2021-06-07 VITALS — BP 116/78 | HR 61 | Ht 67.0 in | Wt 174.4 lb

## 2021-06-07 DIAGNOSIS — I2102 ST elevation (STEMI) myocardial infarction involving left anterior descending coronary artery: Secondary | ICD-10-CM | POA: Diagnosis not present

## 2021-06-07 DIAGNOSIS — E785 Hyperlipidemia, unspecified: Secondary | ICD-10-CM

## 2021-06-07 DIAGNOSIS — I2510 Atherosclerotic heart disease of native coronary artery without angina pectoris: Secondary | ICD-10-CM

## 2021-06-07 DIAGNOSIS — I10 Essential (primary) hypertension: Secondary | ICD-10-CM

## 2021-06-07 DIAGNOSIS — I255 Ischemic cardiomyopathy: Secondary | ICD-10-CM

## 2021-06-07 DIAGNOSIS — Z9861 Coronary angioplasty status: Secondary | ICD-10-CM | POA: Diagnosis not present

## 2021-06-07 MED ORDER — TICAGRELOR 60 MG PO TABS: 60.0000 mg | ORAL_TABLET | Freq: Two times a day (BID) | ORAL | 3 refills | Status: DC

## 2021-06-07 NOTE — Patient Instructions (Addendum)
Medication Instructions:  ? ?No changes  ? ?*If you need a refill on your cardiac medications before your next appointment, please call your pharmacy* ? ? ?Lab Work: ? ? Not needed ?Make sure primary check your  cholesterol( lipid panel) before your next visit with Dr Herbie Baltimore ? ? ? ? ?Testing/Procedures: ?Not needed ? ? ?Follow-Up: ?At [facility], you and your health needs are our priority.  As part of our continuing mission to provide you with exceptional heart care, we have created designated Provider Care Teams.  These Care Teams include your primary Cardiologist (physician) and Advanced Practice Providers (APPs -  Physician Assistants and Nurse Practitioners) who all work together to provide you with the care you need, when you need it. ? ?  ? ?Your next appointment:   ?12 month(s) ? ?The format for your next appointment:   ?In Person ? ?Provider:   ?[name], MD  ? ? ?

## 2021-06-07 NOTE — Progress Notes (Signed)
Primary Care Provider: Libby Maw, MD Cardiologist: Glenetta Hew, MD Electrophysiologist: None  Clinic Note: Chief Complaint  Patient presents with   Follow-up    Doing well.  No major concerns.   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  Had staged PCI to the OM 2 with medical management of distal RCA/PDA occlusion.  Almost full recovery of LV function with revascularization and medication titration.  No further angina or heart failure symptoms.       CAD S/P percutaneous coronary angioplasty - Primary (Chronic)    Extensive LAD PCI as well as stent in the OM 2.  Essentially with the density of stents in his coronaries, would recommend lifelong Thienopyridine.  Was switched to maintenance dose Brilinta 60 mg daily at 1 year out.  No longer on aspirin.  Plan:  Continue current dose of carvedilol and losartan-max tolerable doses -> BP would not tolerate further titration. For now continue current dose of atorvastatin plus Zetia .  On maintenance dose Brilinta 60 mg daily. Okay to [name] [name] Brilinta 5 days preop for surgical procedures.       Relevant Orders   EKG 12-Lead (Completed)   Ischemic cardiomyopathy (Chronic)    Resolved by most recent Echo 55 to 60% EF. Euvolemic on exam.  No real signs of HFpEF  On max tolerated dose of carvedilol and losartan.       Hyperlipidemia LDL goal <70 (Chronic)    Lipids look great.  LDL still well controlled at 42 (a little bit up from 36 before.  He is on 40 mg atorvastatin and tolerating it well.  For now I think we can continue current dose, but low threshold to consider reducing dose since he is also on  Zetia 10 mg.       Essential hypertension (Chronic)    Pretty well-controlled blood pressure on low-dose carvedilol and losartan.  Has not really had any high blood pressure readings that he can recall.        ===================================  HPI:    [name] [name] [name] - "[name] [name] [name];"is a 56 y.o. male with a PMH notable for Anterior STEMI (April 2021) with MV CAD-PCI & Recovered ICM who presents today for 4-5 month follow-up. He returns today at the request of [name], [name] [name],*.  Anterior STEMI 05/2019 -> EF was 35 to 40%  MV CAD-2-vessel PCI: 3 overlapping DES p-mLAD, 1 DES OM2, CTO of  rPDA w/ L-R collaterals - med Rx.   F/u Cath 08/2020: rPDA 100% CTO (high bifurcation) with bridging R-R and L-R collaterals.  Widely patent proximal to distal overlapping LAD stents (5% ISR), widely patent onto stent with 30% stenosis distally.  Low normal EF 50 to 55% and normal EDP. Follow-Up Echo EF 55 to 60% on- medical therapy. Follow-up 08/19/2020 for right ischemic chest pain: Patent stents with RPDA occlusion fills via collaterals.  EF 50 to 55%.  Imdur added along with Zetia Also has a HTN, HLD  [name] [name] [name] was last seen on 01/04/2021 => noted borderline hypotension.  Carvedilol reduced to 3.125 mg twice daily along with losartan to be reduced down to 12.5 mg daily.  Also stopped Imdur. Continued 40 mg atorvastatin  & 10 mg Zetia (LDL 36 & TC 84).  For the most part [name] [name] [name] does not tolerate increased doses of medications.  Recent Hospitalizations: None  He had a second spell COVID this January.  Did relatively well.  No major issues.  Reviewed  CV studies:    The following studies were reviewed today: (if available, images/films reviewed: From Epic Chart or Care Everywhere) No new studies: We reviewed his cath films together.  Interval History:   [name] [name] [name] "[name] [name] [name] " returns here today doing well.  He really has not noticed the dizziness and lightheadedness much  tomorrow.  No further episodes of chest pain since stopping the Imdur.  Since reducing his carvedilol his energy level seems of improved and the dizziness is gone.  He says he every now and then feels a little weird sensation in his chest but is very fleeting and not associated with any peak activity.  Similarly he gets random episodes where he has a hard time catching his breath most like he is gasping--these are also fleeting and short-lived and not worrisome to him.  He does not have any exertional dyspnea, PND or orthopnea. He is really on the go a lot, between his full-time job but then doing a lot of home remodeling, he never stops.  He is lost little weight just because not eating as much and is much more active.  Off-and-on nighttime cramps, but no claudication.  No melena, hematochezia or hematuria or epistaxis.  No palpitations or irregular heartbeats.  No syncope or near syncope.  CV Review of Symptoms (Summary) Cardiovascular ROS: positive for - rare skipped beats & off/on random SOB spells. Nighttime cramps - random  negative for - chest pain, dyspnea on exertion, edema, irregular heartbeat, orthopnea, palpitations, paroxysmal nocturnal dyspnea, rapid heart rate, shortness of breath, or syncope/near syncope, TIA/amaurosis fugax; claudication  REVIEWED OF SYSTEMS   Review of Systems  Constitutional:  Positive for weight loss (always on the go - not eating as much). Negative for malaise/fatigue.  HENT:  Negative for nosebleeds.   Respiratory:  Negative for cough and shortness of breath (just random spells of "stopping to cathc breath").   Cardiovascular:  Negative for claudication.       Per HPI  Gastrointestinal:  Negative for abdominal pain and blood in stool.  Genitourinary:  Negative for hematuria.  Musculoskeletal:  Positive for joint pain.       Occ nighttime cramps  Neurological:  Negative for dizziness (better with lower dose of carvedilol), focal weakness and weakness.  Headaches: Occasional dizziness.  No syncope or near syncope. Psychiatric/Behavioral: Negative.  The patient does not have insomnia.   Normal fatigue-deconditioning with reduced stamina.  No change from exertional dyspnea at baseline.  Joint pain, occasional dizziness.  I have reviewed and (if needed) personally updated the patient's problem list, medications, allergies, past medical and surgical history, social and family history.   PAST MEDICAL HISTORY   Past Medical History:  Diagnosis Date   History of acute anterior wall MI 05/29/2019   100% LAD after SP1 -> (long lesion); 90% prox OM 2 (mod LPL 60%), ~100% CTO of RPDA (high bifurcation, fills via L-R collateral flow) - 3 overlapping DES in LAD & 1 in OM2   Hyperlipidemia LDL goal <70    Multiple vessel coronary artery disease 05/29/2019   Cath 05/29/2019 : Anterior STEMI -> Severe Three-Vessel Disease:  CULPRIT LESION 100% LAD after SP1 (long); 90%  pOM 2 (with mod LPL), ~100 CTO of RPDA (high bifurcation, L-R collateral); Successful DES PCI p-mLAD w/ 3 overlapping Resolute Onyx DES (3.0 x 12, 2.5 x 38, & 2.25 x 15 --> 3.65-2.8 mm), OM2 Resolute Onyx DES 2 x 12 - 2.2 mm.     PAST SURGICAL HISTORY   Past Surgical History:  Procedure Laterality Date   CORONARY STENT INTERVENTION N/A 05/29/2019   Procedure: CORONARY STENT INTERVENTION;  Surgeon: Leonie Man, MD;  Location: [facility];  Service: Cardiovascular;; 2nd LESION: OM 2 with Resolute Onyx DES 2.0 mm x 12 mm--postdilated to 2.2 mm)   CORONARY/GRAFT ACUTE MI REVASCULARIZATION N/A 05/29/2019   Procedure: CORONARY/GRAFT ACUTE MI REVASCULARIZATION;  Surgeon: Leonie Man, MD;  Location: [facility];  Service: Cardiovascular;  Successful DES PCI of extensive proximal to mid LAD using 3 overlapping Resolute Onyx DES stents (3.0 mm x 12 mm, 2.5 mm x 38 mm, and 2.25 mm x 15 mm -> entire segment postdilated from 3.65 mm down to 2.8 mm)    LEFT HEART CATH AND CORONARY  ANGIOGRAPHY N/A 05/29/2019   Procedure: LEFT HEART CATH AND CORONARY ANGIOGRAPHY;  Surgeon: Leonie Man, MD;  Location: [place];  Service: Cardiovascular;; CULPRIT LESION 100% LAD after SP1 (long lesion); 90% proximal OM 2 (with moderate LPL), 100% CTO of RPDA (high bifurcation, fills via L-R collaterals; EF ~35-40% mid-apical Hypo-AK. LVEDP 22 mmHg.   LEFT HEART CATH AND CORONARY ANGIOGRAPHY N/A 08/20/2020   Procedure: LEFT HEART CATH AND CORONARY ANGIOGRAPHY;  Surgeon: Leonie Man, MD;  Location: [facility];  Service: Cardiovascular; widely patent overlapping stents in the LAD with 95% ISR.  Widely patent LAD stent followed by 30% stenosis.  RPDA 100% occluded after high bifurcation with bridging, or-R and L and R collaterals.  EF 50 to 55%.  Normal EDP.   TRANSTHORACIC ECHOCARDIOGRAM  09/2019   EF ~ 55-60%, Mid-apical Inferoseptal HK & AK of apical and anteroseptal wall.  Gr II DD.  Normal RV size and function.  Mild aortic sclerosis.   TRANSTHORACIC ECHOCARDIOGRAM  05/2019    (Anterior STEMI) --> EF 35 to 40%.  Entire anterior wall, mid and distal anterior septum and apex are akinetic.  Mild LVH, GR 1 DD.   Cath 08/20/2020: rPDA 100% after high bifurcation -bridging, R-R and L-R collaterals noted.  Widely patent proximal and distal LAD stents with~5% in-stent stenosis.  Widely patent OM2 stent followed by 30% stenosis..  Low normal EF 50 to 55%.  Normal EDP.      Immunization History  Administered Date(s) Administered   Influenza,inj,Quad PF,6+ Mos 01/05/2020, 11/13/2020   Moderna Sars-Covid-2 Vaccination 06/26/2019, 07/07/2019, 08/10/2019   Pneumococcal Polysaccharide-23 05/14/2020   Zoster Recombinat (Shingrix) 05/13/2021    MEDICATIONS/ALLERGIES   Current Meds  Medication Sig   atorvastatin (LIPITOR) 40 MG tablet TAKE 1 TABLET BY MOUTH EVERY DAY   carvedilol (COREG) 3.125 MG tablet Take 1 tablet (3.125 mg total) by mouth 2 (two) times daily.   ezetimibe  (ZETIA) 10 MG tablet Take 1 tablet (10 mg total) by mouth daily.   losartan (COZAAR) 25 MG tablet TAKE 1/2 TABLET BY MOUTH EVERY DAY   nitroGLYCERIN (NITROSTAT) 0.4 MG SL tablet Place 1 tablet (0.4 mg total) under the tongue every 5 (five) minutes as needed for chest pain (up to 3 doses. If taking 3rd dose, call 911.).   [DISCONTINUED] ticagrelor (BRILINTA) 60 MG TABS tablet Take 1 tablet (60  mg total) by mouth 2 (two) times daily.    No Known Allergies  SOCIAL HISTORY/FAMILY HISTORY   Reviewed in Epic:  Pertinent findings:  Social History   Tobacco Use   Smoking status: Never   Smokeless tobacco: Never  Vaping Use   Vaping Use: Never used  Substance Use Topics   Alcohol use: Never   Drug use: Never   Social History   Social History Narrative   Lives with his wife in [place].      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    OBJCTIVE -PE, EKG, labs   Wt Readings from Last 3 Encounters:  06/07/21 174 lb 6.4 oz (79.1 kg)  05/13/21 173 lb 3.2 oz (78.6 kg)  01/04/21 179 lb (81.2 kg)    Physical Exam: BP 116/78   Pulse 61   Ht 5\' 7"  (1.702 m)   Wt 174 lb 6.4 oz (79.1 kg)   SpO2 98%   BMI 27.31 kg/m  Physical Exam Vitals reviewed.  Constitutional:      General: He is not in acute distress.    Appearance: Normal appearance. He is normal weight. He is not ill-appearing or toxic-appearing.  HENT:     Head: Normocephalic and atraumatic.  Neck:     Vascular: No carotid bruit.  Cardiovascular:     Rate and Rhythm: Normal rate and regular rhythm. No extrasystoles are present.    Chest Wall: PMI is not displaced.     Pulses: Normal pulses.     Heart sounds: Normal heart sounds, S1 normal and S2 normal. No murmur heard.   No friction rub. No gallop. No S4 sounds.  Pulmonary:     Effort: Pulmonary effort is normal. No respiratory distress.     Breath sounds: Normal breath sounds. No wheezing or rhonchi.  Musculoskeletal:        General:  No swelling. Normal range of motion.     Cervical back: Normal range of motion and neck supple.  Skin:    General: Skin is warm and dry.     Coloration: Skin is not pale.  Neurological:     General: No focal deficit present.     Mental Status: He is alert and oriented to person, place, and time. Mental status is at baseline.     Cranial Nerves: No cranial nerve deficit.     Motor: No weakness.     Gait: Gait normal.  Psychiatric:        Mood and Affect: Mood normal.        Behavior: Behavior normal.        Thought Content: Thought content normal.        Judgment: Judgment normal.     Adult ECG Report  Rate: 61 ;  Rhythm: normal sinus rhythm and Inc RBBB, Septal MI, age indeterminate ;   Narrative Interpretation: Stable  Recent Labs: Reviewed.  Stable. Lab Results  Component Value Date   CHOL 87 05/13/2021   HDL 36.00 (L) 05/13/2021   LDLCALC 42 05/13/2021   TRIG 45.0 05/13/2021   CHOLHDL 2 05/13/2021   Lab Results  Component Value Date   CREATININE 0.91 05/13/2021   BUN 12 05/13/2021   NA 140 05/13/2021   K 4.2 05/13/2021   CL 105 05/13/2021   CO2 27 05/13/2021      Latest Ref Rng & Units 05/13/2021    9:59 AM 08/20/2020    8:11 AM 08/19/2020  5:09 PM  CBC  WBC 4.0 - 10.5 K/uL 7.2   6.9   7.7    Hemoglobin 13.0 - 17.0 g/dL 15.3   15.8   15.4    Hematocrit 39.0 - 52.0 % 44.7   46.5   44.2    Platelets 150.0 - 400.0 K/uL 250.0   245   255      Lab Results  Component Value Date   HGBA1C 5.6 08/20/2020   Lab Results  Component Value Date   TSH 1.221 05/29/2019    ==================================================  COVID-19 Education: The signs and symptoms of COVID-19 were discussed with the patient and how to seek care for testing (follow up with PCP or arrange E-visit).    I spent a total of 27  minutes with the patient spent in direct patient consultation.  Additional time spent with chart review  / charting (studies, outside notes, etc): 14 min Total  Time: 41 min  Current medicines are reviewed at length with the patient today.  (+/- concerns) None  This visit occurred during the SARS-CoV-2 public health emergency.  Safety protocols were in place, including screening questions prior to the visit, additional usage of staff PPE, and extensive cleaning of exam room while observing appropriate contact time as indicated for disinfecting solutions.  Notice: This dictation was prepared with Dragon dictation along with smart phrase technology. Any transcriptional errors that result from this process are unintentional and may not be corrected upon review.  Studies Ordered:   Orders Placed This Encounter  Procedures   EKG 12-Lead   Meds ordered this encounter  Medications   ticagrelor (BRILINTA) 60 MG TABS tablet    Sig: Take 1 tablet (60 mg total) by mouth 2 (two) times daily.    Dispense:  180 tablet    Refill:  3    Patient Instructions / Medication Changes & Studies & Tests Ordered   Patient Instructions  Medication Instructions:   No changes   *If you need a refill on your cardiac medications before your next appointment, please call your pharmacy*   Lab Work:   Not needed Make sure primary check your  cholesterol( lipid panel) before your next visit with Dr Ellyn Hack     Testing/Procedures: Not needed   Follow-Up: At [facility], you and your health needs are our priority.  As part of our continuing mission to provide you with exceptional heart care, we have created designated Provider Care Teams.  These Care Teams include your primary Cardiologist (physician) and Advanced Practice Providers (APPs -  Physician Assistants and Nurse Practitioners) who all work together to provide you with the care you need, when you need it.     Your next appointment:   12 month(s)  The format for your next appointment:   In Person  Provider:   Glenetta Hew, MD      Glenetta Hew, M.D., M.S. Interventional Cardiologist    Pager # [phone] Phone # [phone] [address] [place], [place] [address]   Thank you for choosing Heartcare at [facility]!!

## 2021-07-14 ENCOUNTER — Encounter: Payer: Self-pay | Admitting: Cardiology

## 2021-07-14 NOTE — Assessment & Plan Note (Signed)
Resolved by most recent Echo 55 to 60% EF. Euvolemic on exam.  No real signs of HFpEF  On max tolerated dose of carvedilol and losartan.

## 2021-07-14 NOTE — Assessment & Plan Note (Signed)
Lipids look great.  LDL still well controlled at 42 (a little bit up from 36 before.  He is on 40 mg atorvastatin and tolerating it well.  For now I think we can continue current dose, but low threshold to consider reducing dose since he is also on Zetia 10 mg.

## 2021-07-14 NOTE — Assessment & Plan Note (Signed)
Pretty well-controlled blood pressure on low-dose carvedilol and losartan.  Has not really had any high blood pressure readings that he can recall.

## 2021-07-14 NOTE — Assessment & Plan Note (Signed)
Extensive LAD PCI as well as stent in the OM 2.  Essentially with the density of stents in his coronaries, would recommend lifelong Thienopyridine.  Was switched to maintenance dose Brilinta 60 mg daily at 1 year out.  No longer on aspirin.  Plan:   Continue current dose of carvedilol and losartan-max tolerable doses -> BP would not tolerate further titration.  For now continue current dose of atorvastatin plus Zetia  .  On maintenance dose Brilinta 60 mg daily.  Okay to hold Brilinta 5 days preop for surgical procedures.

## 2021-07-14 NOTE — Assessment & Plan Note (Signed)
Just over 2 years out from his large anterior STEMI requiring 3 stents to the LAD.  Had staged PCI to the OM 2 with medical management of distal RCA/PDA occlusion.  Almost full recovery of LV function with revascularization and medication titration.  No further angina or heart failure symptoms.

## 2021-09-18 ENCOUNTER — Other Ambulatory Visit: Payer: Self-pay | Admitting: Cardiology

## 2021-09-22 ENCOUNTER — Other Ambulatory Visit: Payer: Self-pay | Admitting: Cardiology

## 2021-09-24 ENCOUNTER — Other Ambulatory Visit: Payer: Self-pay | Admitting: Cardiology

## 2021-11-12 ENCOUNTER — Ambulatory Visit: Payer: BC Managed Care – PPO | Admitting: Family Medicine

## 2021-11-16 ENCOUNTER — Encounter: Payer: Self-pay | Admitting: Family Medicine

## 2021-11-16 ENCOUNTER — Ambulatory Visit: Payer: BC Managed Care – PPO | Admitting: Family Medicine

## 2021-11-16 VITALS — BP 108/66 | HR 54 | Temp 97.3°F | Ht 67.0 in | Wt 178.0 lb

## 2021-11-16 DIAGNOSIS — H6993 Unspecified Eustachian tube disorder, bilateral: Secondary | ICD-10-CM | POA: Diagnosis not present

## 2021-11-16 DIAGNOSIS — Z23 Encounter for immunization: Secondary | ICD-10-CM | POA: Diagnosis not present

## 2021-11-16 DIAGNOSIS — M2550 Pain in unspecified joint: Secondary | ICD-10-CM | POA: Diagnosis not present

## 2021-11-16 MED ORDER — DICLOFENAC SODIUM 1 % EX GEL: CUTANEOUS | 1 refills | Status: AC

## 2021-11-16 NOTE — Progress Notes (Signed)
Established Patient Office Visit  Subjective   Patient ID: [name] [name], male    DOB: 1966-01-07  Age: 56 y.o. MRN: [id number]  Chief Complaint  Patient presents with   Follow-up    6 month follow up, check ears feel little clogged up. Discuss frequent aches and pains. Not fasting.     HPI ongoing issues with ears.  Much of the time.  [place] seems to help open them up.  Hearing difficulty in the left ear.  He has tinnitus in his ear.  He denies headaches and dizziness.  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  Ongoing joint aches and pains knees ankles they can be random.  They usually resolve.  Right shoulder pain.  He Hydrographic surveyor system alarms.    Review of Systems  Constitutional: Negative.   HENT:  Positive for hearing loss and tinnitus.   Eyes:  Negative for blurred vision, discharge and redness.  Respiratory: Negative.    Cardiovascular: Negative.   Gastrointestinal:  Negative for abdominal pain.  Genitourinary: Negative.   Musculoskeletal:  Positive for joint pain.  Skin:  Negative for rash.  Neurological:  Negative for dizziness, tingling, loss of consciousness, weakness and headaches.  Endo/Heme/Allergies:  Negative for polydipsia.      Objective:     BP 108/66 (BP Location: Left Arm, Patient Position: Sitting, Cuff Size: Normal)   Pulse (!) 54   Temp (!) 97.3 F (36.3 C) (Temporal)   Ht 5\' 7"  (1.702 m)   Wt 178 lb (80.7 kg)   SpO2 98%   BMI 27.88 kg/m    Physical Exam Constitutional:      General: He is not in acute distress.    Appearance: Normal appearance. He is not ill-appearing, toxic-appearing or diaphoretic.  HENT:     Head: Normocephalic and atraumatic.     Right Ear: Tympanic membrane and external ear normal.     Left Ear: Tympanic membrane and external ear normal.     Ears:   Eyes:     General: No scleral icterus.       Right eye: No discharge.        Left eye: No discharge.     Extraocular Movements: Extraocular  movements intact.     Conjunctiva/sclera: Conjunctivae normal.     Pupils: Pupils are equal, round, and reactive to light.  Cardiovascular:     Rate and Rhythm: Normal rate and regular rhythm.  Pulmonary:     Effort: Pulmonary effort is normal. No respiratory distress.     Breath sounds: Normal breath sounds.  Abdominal:     General: Bowel sounds are normal.  Skin:    General: Skin is warm and dry.  Neurological:     Mental Status: He is alert and oriented to person, place, and time.  Psychiatric:        Mood and Affect: Mood normal.        Behavior: Behavior normal.      No results found for any visits on 11/16/21.    The ASCVD Risk score (Arnett DK, et al., 2019) failed to calculate for the following reasons:   The patient has a prior MI or stroke diagnosis    Assessment & Plan:   Problem List Items Addressed This Visit       Nervous and Auditory   Dysfunction of both eustachian tubes     Other   Need for shingles vaccine - Primary   Relevant Orders   Varicella-zoster vaccine  IM   Need for Tdap vaccination   Relevant Orders   Tdap vaccine greater than or equal to 7yo IM   Arthralgia   Relevant Medications   diclofenac Sodium (VOLTAREN) 1 % GEL    Return in about 6 months (around 05/18/2022), or Return fasting for physical in April..    Demonstrated eustachian tube exercises and he was given information regarding this issue.  He will do these 2-3 times daily.  Shoulder exercises.  Will use Voltaren gel as needed on various arthralgias including shoulder.  Explained that his shoulder pain is likely an occupational issue.  Exercises can help Shingrix and Tdap today.  Follow-up in 6 months for physical exam. Mliss Sax, MD

## 2021-12-24 DIAGNOSIS — R070 Pain in throat: Secondary | ICD-10-CM | POA: Diagnosis not present

## 2021-12-24 DIAGNOSIS — J36 Peritonsillar abscess: Secondary | ICD-10-CM | POA: Diagnosis not present

## 2021-12-24 DIAGNOSIS — R509 Fever, unspecified: Secondary | ICD-10-CM | POA: Diagnosis not present

## 2021-12-30 ENCOUNTER — Encounter: Payer: Self-pay | Admitting: Family Medicine

## 2021-12-30 ENCOUNTER — Ambulatory Visit: Payer: BC Managed Care – PPO | Admitting: Family Medicine

## 2021-12-30 VITALS — BP 110/80 | HR 74 | Temp 97.7°F | Ht 67.0 in | Wt 175.2 lb

## 2021-12-30 DIAGNOSIS — R0982 Postnasal drip: Secondary | ICD-10-CM

## 2021-12-30 DIAGNOSIS — J22 Unspecified acute lower respiratory infection: Secondary | ICD-10-CM

## 2021-12-30 MED ORDER — FLUNISOLIDE 25 MCG/ACT (0.025%) NA SOLN: 2.0000 | Freq: Two times a day (BID) | NASAL | 0 refills | Status: DC

## 2021-12-30 MED ORDER — AZITHROMYCIN 250 MG PO TABS: ORAL_TABLET | ORAL | 0 refills | Status: AC

## 2021-12-30 NOTE — Progress Notes (Signed)
Established Patient Office Visit  Subjective   Patient ID: [name] [name], male    DOB: 1965-06-11  Age: 56 y.o. MRN: [id number]  Chief Complaint  Patient presents with   Sore Throat    Pt c/o sore throat x 1 week Mucinex for symptoms    Sore Throat  Associated symptoms include coughing. Pertinent negatives include no abdominal pain or shortness of breath.   presents with a 7 to 8-day history of URI signs and symptoms.  This started out acutely with fever chills body aches myalgias arthralgias sore throat cough.  Sore throat and fevers and body aches have resolved.  He has been left with a cough productive of scant dark green phlegm.  There is been no wheezing or difficulty breathing.  There is ongoing postnasal drip.    Review of Systems  Constitutional: Negative.   HENT: Negative.  Negative for sore throat.   Eyes:  Negative for blurred vision, discharge and redness.  Respiratory:  Positive for cough and sputum production. Negative for shortness of breath and wheezing.   Cardiovascular: Negative.   Gastrointestinal:  Negative for abdominal pain.  Genitourinary: Negative.   Musculoskeletal: Negative.  Negative for joint pain and myalgias.  Skin:  Negative for rash.  Neurological:  Negative for tingling, loss of consciousness and weakness.  Endo/Heme/Allergies:  Negative for polydipsia.      Objective:     BP 110/80 (BP Location: Right Leg, Patient Position: Sitting, Cuff Size: Normal)   Pulse 74   Temp 97.7 F (36.5 C) (Temporal)   Ht 5\' 7"  (1.702 m)   Wt 175 lb 3.2 oz (79.5 kg)   SpO2 93%   BMI 27.44 kg/m    Physical Exam Constitutional:      General: He is not in acute distress.    Appearance: Normal appearance. He is not ill-appearing, toxic-appearing or diaphoretic.  HENT:     Head: Normocephalic and atraumatic.     Right Ear: Tympanic membrane, ear canal and external ear normal.     Left Ear: Tympanic membrane, ear canal and external ear normal.      Mouth/Throat:     Mouth: Mucous membranes are moist.     Pharynx: Oropharynx is clear. No oropharyngeal exudate or posterior oropharyngeal erythema.  Eyes:     General: No scleral icterus.       Right eye: No discharge.        Left eye: No discharge.     Extraocular Movements: Extraocular movements intact.     Conjunctiva/sclera: Conjunctivae normal.     Pupils: Pupils are equal, round, and reactive to light.  Cardiovascular:     Rate and Rhythm: Normal rate and regular rhythm.  Pulmonary:     Effort: Pulmonary effort is normal. No respiratory distress.     Breath sounds: Normal breath sounds. No wheezing or rales.  Abdominal:     General: Bowel sounds are normal.     Tenderness: There is no abdominal tenderness. There is no guarding.  Musculoskeletal:     Cervical back: No rigidity or tenderness.  Skin:    General: Skin is warm and dry.  Neurological:     Mental Status: He is alert and oriented to person, place, and time.  Psychiatric:        Mood and Affect: Mood normal.        Behavior: Behavior normal.      No results found for any visits on 12/30/21.    The ASCVD Risk  score (Arnett DK, et al., 2019) failed to calculate for the following reasons:   The patient has a prior MI or stroke diagnosis    Assessment & Plan:   Problem List Items Addressed This Visit       Respiratory   Lower respiratory tract infection - Primary   Relevant Medications   azithromycin (ZITHROMAX) 250 MG tablet   flunisolide (NASALIDE) 25 MCG/ACT (0.025%) SOLN     Other   Post-nasal drip   Relevant Medications   flunisolide (NASALIDE) 25 MCG/ACT (0.025%) SOLN    Return if symptoms worsen or fail to improve.  Complete course of Zithromax.  Use Nasalide for 2 weeks.  Mucinex DM as needed  Mliss Sax, MD

## 2022-01-21 ENCOUNTER — Other Ambulatory Visit: Payer: Self-pay | Admitting: Family Medicine

## 2022-01-21 DIAGNOSIS — J22 Unspecified acute lower respiratory infection: Secondary | ICD-10-CM

## 2022-01-21 DIAGNOSIS — R0982 Postnasal drip: Secondary | ICD-10-CM

## 2022-01-22 ENCOUNTER — Other Ambulatory Visit: Payer: Self-pay | Admitting: Cardiology

## 2022-05-19 ENCOUNTER — Encounter: Payer: BC Managed Care – PPO | Admitting: Family Medicine

## 2022-05-20 ENCOUNTER — Other Ambulatory Visit: Payer: Self-pay | Admitting: Cardiology

## 2022-05-23 ENCOUNTER — Ambulatory Visit (INDEPENDENT_AMBULATORY_CARE_PROVIDER_SITE_OTHER): Payer: BC Managed Care – PPO | Admitting: Family Medicine

## 2022-05-23 ENCOUNTER — Encounter: Payer: Self-pay | Admitting: Family Medicine

## 2022-05-23 VITALS — BP 104/66 | HR 61 | Temp 97.7°F | Ht 67.0 in | Wt 177.2 lb

## 2022-05-23 DIAGNOSIS — Z8679 Personal history of other diseases of the circulatory system: Secondary | ICD-10-CM | POA: Diagnosis not present

## 2022-05-23 DIAGNOSIS — N5201 Erectile dysfunction due to arterial insufficiency: Secondary | ICD-10-CM

## 2022-05-23 DIAGNOSIS — Z0000 Encounter for general adult medical examination without abnormal findings: Secondary | ICD-10-CM

## 2022-05-23 LAB — URINALYSIS, ROUTINE W REFLEX MICROSCOPIC
Bilirubin Urine: NEGATIVE
Hgb urine dipstick: NEGATIVE
Ketones, ur: NEGATIVE
Leukocytes,Ua: NEGATIVE
Nitrite: NEGATIVE
RBC / HPF: NONE SEEN (ref 0–?)
Specific Gravity, Urine: 1.025 (ref 1.000–1.030)
Total Protein, Urine: NEGATIVE
Urine Glucose: NEGATIVE
Urobilinogen, UA: 0.2 (ref 0.0–1.0)
pH: 5.5 (ref 5.0–8.0)

## 2022-05-23 LAB — COMPREHENSIVE METABOLIC PANEL
ALT: 27 U/L (ref 0–53)
AST: 17 U/L (ref 0–37)
Albumin: 4.6 g/dL (ref 3.5–5.2)
Alkaline Phosphatase: 87 U/L (ref 39–117)
BUN: 14 mg/dL (ref 6–23)
CO2: 25 mEq/L (ref 19–32)
Calcium: 10 mg/dL (ref 8.4–10.5)
Chloride: 105 mEq/L (ref 96–112)
Creatinine, Ser: 0.95 mg/dL (ref 0.40–1.50)
GFR: 89.8 mL/min (ref >60.00)
Glucose, Bld: 102 mg/dL — ABNORMAL HIGH (ref 70–99)
Potassium: 4.5 mEq/L (ref 3.5–5.1)
Sodium: 141 mEq/L (ref 135–145)
Total Bilirubin: 0.8 mg/dL (ref 0.2–1.2)
Total Protein: 6.9 g/dL (ref 6.0–8.3)

## 2022-05-23 LAB — CBC
HCT: 48 % (ref 39.0–52.0)
Hemoglobin: 16.4 g/dL (ref 13.0–17.0)
MCHC: 34.2 g/dL (ref 30.0–36.0)
MCV: 91.3 fl (ref 78.0–100.0)
Platelets: 276 10*3/uL (ref 150.0–400.0)
RBC: 5.25 Mil/uL (ref 4.22–5.81)
RDW: 13 % (ref 11.5–15.5)
WBC: 8.7 10*3/uL (ref 4.0–10.5)

## 2022-05-23 LAB — LIPID PANEL
Cholesterol: 103 mg/dL (ref 0–200)
HDL: 39 mg/dL — ABNORMAL LOW (ref >39.00)
LDL Cholesterol: 52 mg/dL (ref 0–99)
NonHDL: 63.91
Total CHOL/HDL Ratio: 3
Triglycerides: 60 mg/dL (ref 0.0–149.0)
VLDL: 12 mg/dL (ref 0.0–40.0)

## 2022-05-23 LAB — PSA: PSA: 0.62 ng/mL (ref 0.10–4.00)

## 2022-05-23 LAB — HEMOGLOBIN A1C: Hgb A1c MFr Bld: 6.1 % (ref 4.6–6.5)

## 2022-05-23 MED ORDER — SILDENAFIL CITRATE 100 MG PO TABS
50.0000 mg | ORAL_TABLET | Freq: Every day | ORAL | 11 refills | Status: DC | PRN
Start: 2022-05-23 — End: 2023-08-23

## 2022-05-23 NOTE — Progress Notes (Signed)
Established Patient Office Visit   Subjective:  Patient ID: [name] [name], male    DOB: [birth year]  Age: 57 y.o. MRN: [id number]  Chief Complaint  Patient presents with   Annual Exam    CPE, no concerns. Patient fasting.     HPI Encounter Diagnoses  Name Primary?   Healthcare maintenance Yes   Erectile dysfunction due to arterial insufficiency    History of CAD (coronary artery disease)    Here for yearly physical.  Doing well.  Continues to work Manufacturing systems engineer.  Job is active but has been active also outside of work renovating his wife's old house for her daughter.  He has been informed hours outside of work on this project.  Apparently she will not be paying rent.  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  He has not needed nitroglycerin since treatment.  He has been having issues with achieving an erection.   Review of Systems  Constitutional: Negative.   HENT: Negative.    Eyes:  Negative for blurred vision, discharge and redness.  Respiratory: Negative.    Cardiovascular: Negative.   Gastrointestinal:  Negative for abdominal pain.  Genitourinary: Negative.   Musculoskeletal: Negative.  Negative for myalgias.  Skin:  Negative for rash.  Neurological:  Negative for tingling, loss of consciousness and weakness.  Endo/Heme/Allergies:  Negative for polydipsia.      05/23/2022    8:41 AM 05/23/2022    8:16 AM 12/30/2021    1:24 PM  Depression screen PHQ 2/9  Decreased Interest 1 0 0  Down, Depressed, Hopeless 0 0 0  PHQ - 2 Score 1 0 0  Altered sleeping 1    Tired, decreased energy 1    Change in appetite 0    Feeling bad or failure about yourself  0    Trouble concentrating 0    Moving slowly or fidgety/restless 0    Suicidal thoughts 0    PHQ-9 Score 3    Difficult doing work/chores Not difficult at all         Current Outpatient Medications:    atorvastatin (LIPITOR) 40 MG tablet, TAKE 1 TABLET BY MOUTH EVERY DAY, Disp: 90 tablet, Rfl: 0    carvedilol (COREG) 3.125 MG tablet, TAKE 1 TABLET BY MOUTH 2 TIMES DAILY., Disp: 180 tablet, Rfl: 3   diclofenac Sodium (VOLTAREN) 1 % GEL, Apply a small grape sized dollop onto affected joint up to 4 times daily., Disp: 150 g, Rfl: 1   ezetimibe (ZETIA) 10 MG tablet, TAKE 1 TABLET BY MOUTH EVERY DAY, Disp: 90 tablet, Rfl: 3   flunisolide (NASALIDE) 25 MCG/ACT (0.025%) SOLN, PLACE 2 SPRAYS INTO THE NOSE 2 (TWO) TIMES DAILY., Disp: 75 mL, Rfl: 1   losartan (COZAAR) 25 MG tablet, TAKE 1/2 TABLET BY MOUTH EVERY DAY, Disp: 90 tablet, Rfl: 3   nitroGLYCERIN (NITROSTAT) 0.4 MG SL tablet, Place 1 tablet (0.4 mg total) under the tongue every 5 (five) minutes as needed for chest pain (up to 3 doses. If taking 3rd dose, call 911.)., Disp: 25 tablet, Rfl: 3   sildenafil (VIAGRA) 100 MG tablet, Take 0.5-1 tablets (50-100 mg total) by mouth daily as needed for erectile dysfunction., Disp: 5 tablet, Rfl: 11   ticagrelor (BRILINTA) 60 MG TABS tablet, Take 1 tablet (60 mg total) by mouth 2 (two) times daily., Disp: 180 tablet, Rfl: 3   Objective:     BP 104/66 (BP Location: Left Arm, Patient Position: Sitting, Cuff Size: Normal)  Pulse 61   Temp 97.7 F (36.5 C) (Temporal)   Ht 5\' 7"  (1.702 m)   Wt 177 lb 3.2 oz (80.4 kg)   SpO2 99%   BMI 27.75 kg/m  Wt Readings from Last 3 Encounters:  05/23/22 177 lb 3.2 oz (80.4 kg)  12/30/21 175 lb 3.2 oz (79.5 kg)  11/16/21 178 lb (80.7 kg)      Physical Exam Constitutional:      General: He is not in acute distress.    Appearance: Normal appearance. He is not ill-appearing, toxic-appearing or diaphoretic.  HENT:     Head: Normocephalic and atraumatic.     Right Ear: External ear normal.     Left Ear: External ear normal.     Mouth/Throat:     Mouth: Mucous membranes are moist.     Pharynx: Oropharynx is clear. No oropharyngeal exudate or posterior oropharyngeal erythema.  Eyes:     General: No scleral icterus.       Right eye: No discharge.         Left eye: No discharge.     Extraocular Movements: Extraocular movements intact.     Conjunctiva/sclera: Conjunctivae normal.     Pupils: Pupils are equal, round, and reactive to light.  Cardiovascular:     Rate and Rhythm: Normal rate and regular rhythm.  Pulmonary:     Effort: Pulmonary effort is normal. No respiratory distress.     Breath sounds: Normal breath sounds.  Abdominal:     General: Bowel sounds are normal.     Tenderness: There is no abdominal tenderness. There is no guarding or rebound.     Hernia: No hernia is present. There is no hernia in the left inguinal area or right inguinal area.  Genitourinary:    Penis: Circumcised. No hypospadias, erythema, tenderness, discharge, swelling or lesions.      Testes:        Right: Mass, tenderness or swelling not present. Right testis is descended.        Left: Mass, tenderness or swelling not present. Left testis is descended.     Epididymis:     Right: Not inflamed or enlarged.     Left: Not inflamed or enlarged.  Musculoskeletal:     Cervical back: No rigidity or tenderness.  Lymphadenopathy:     Lower Body: No right inguinal adenopathy. No left inguinal adenopathy.  Skin:    General: Skin is warm and dry.  Neurological:     Mental Status: He is alert and oriented to person, place, and time.  Psychiatric:        Mood and Affect: Mood normal.        Behavior: Behavior normal.      No results found for any visits on 05/23/22.    The ASCVD Risk score (Arnett DK, et al., 2019) failed to calculate for the following reasons:   The patient has a prior MI or stroke diagnosis    Assessment & Plan:   Healthcare maintenance -     CBC -     Hemoglobin A1c -     PSA -     Urinalysis, Routine w reflex microscopic  Erectile dysfunction due to arterial insufficiency -     Sildenafil Citrate; Take 0.5-1 tablets (50-100 mg total) by mouth daily as needed for erectile dysfunction.  Dispense: 5 tablet; Refill: 11  History  of CAD (coronary artery disease) -     CBC -     Comprehensive metabolic panel -  Lipid panel    Return in about 1 year (around 05/23/2023), or if symptoms worsen or fail to improve.  Patient has not needed his NTG tablets since his MI in 2021 and was advised that these cannot be taken with the Viagra.  He will take Viagra 45 minutes prior.  Information was given on health maintenance and disease prevention.  He was also given on sildenafil.  Mliss SaxWilliam Alfred Lajoyce Tamura, MD

## 2022-06-03 ENCOUNTER — Other Ambulatory Visit: Payer: Self-pay | Admitting: Cardiology

## 2022-07-02 IMAGING — CR DG CHEST 2V
2 series · 2 of 2 positions shown · non-contrast
Comparison: None.

CLINICAL DATA: Intermittent, worsening chest pain

EXAM:
CHEST - 2 VIEW

[chest pa]
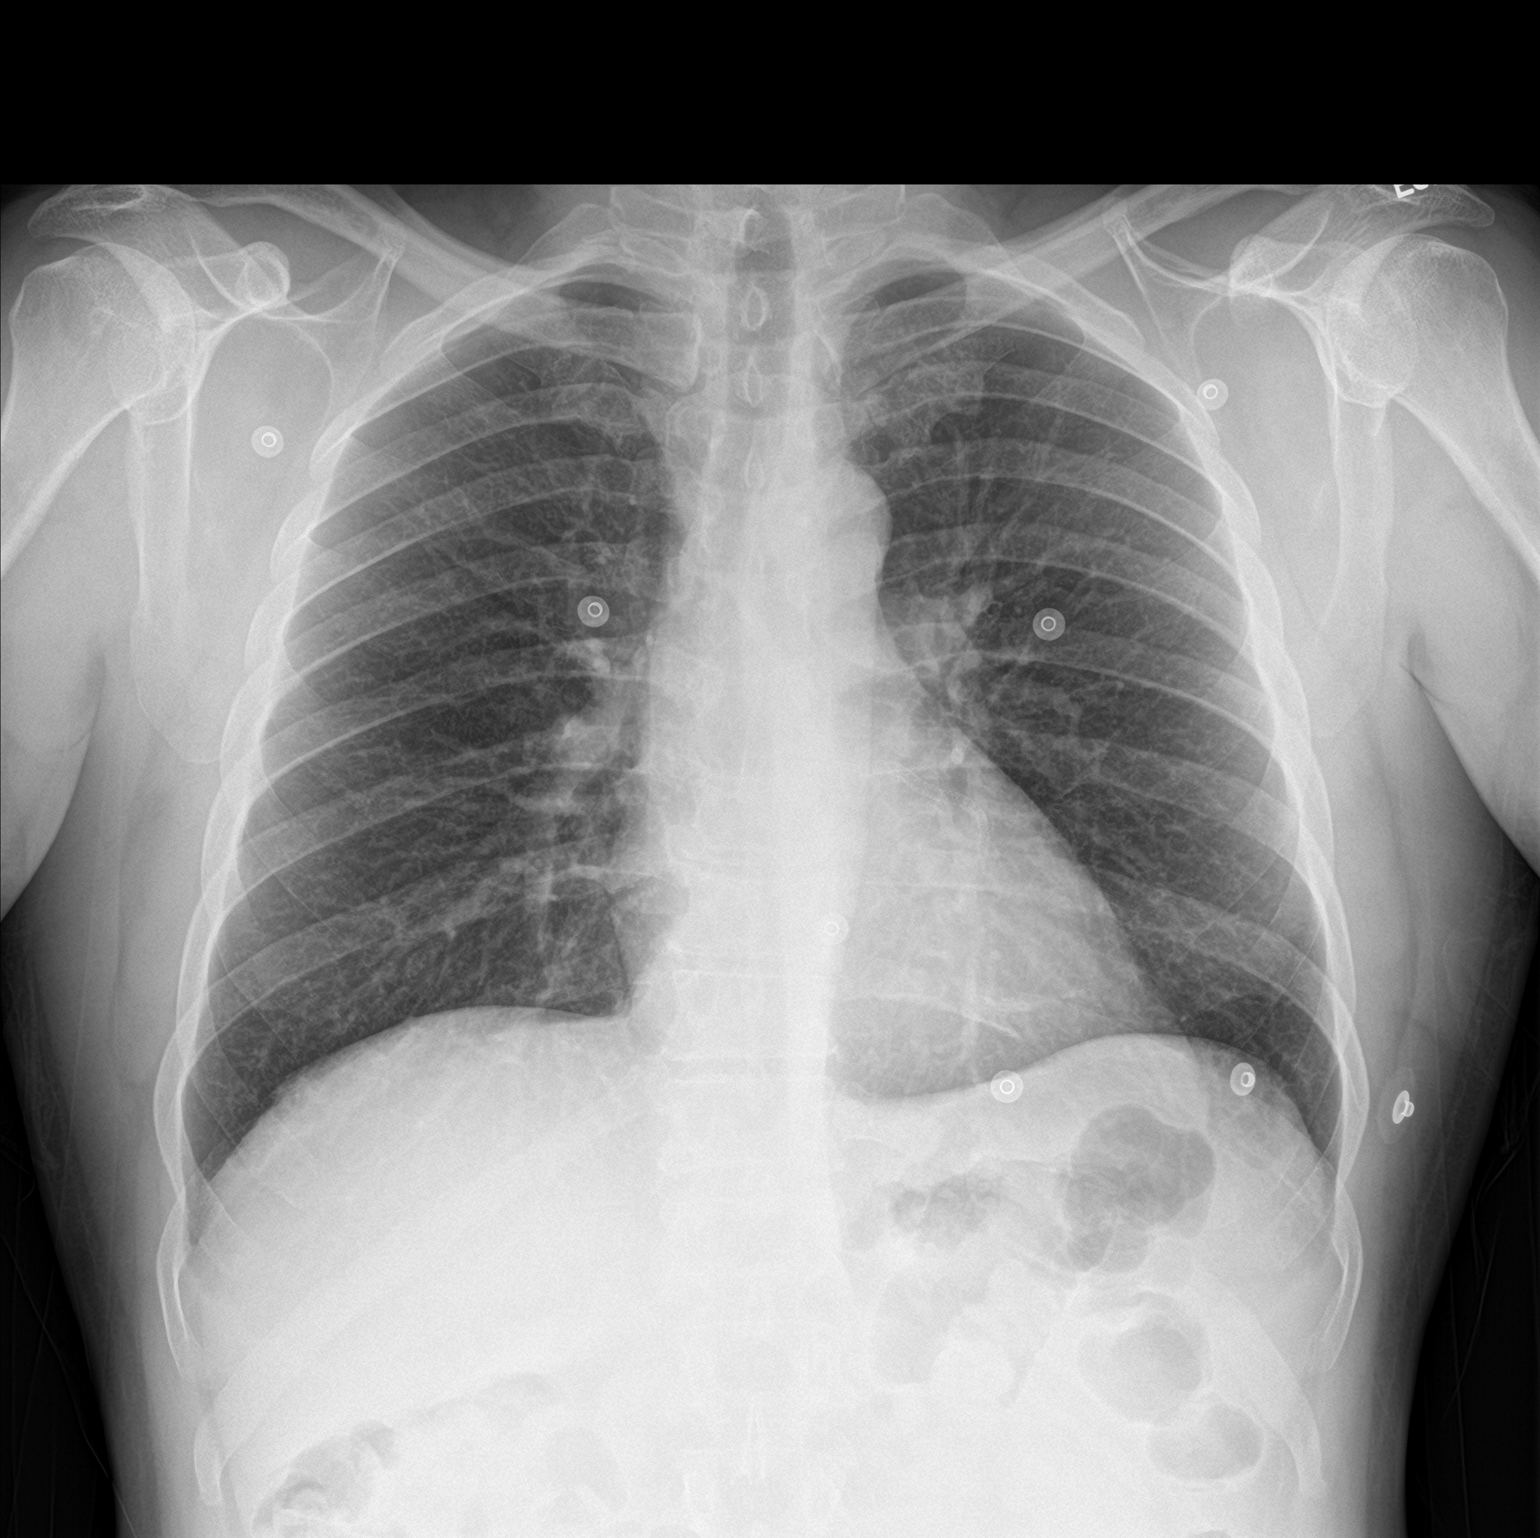

[chest lat]
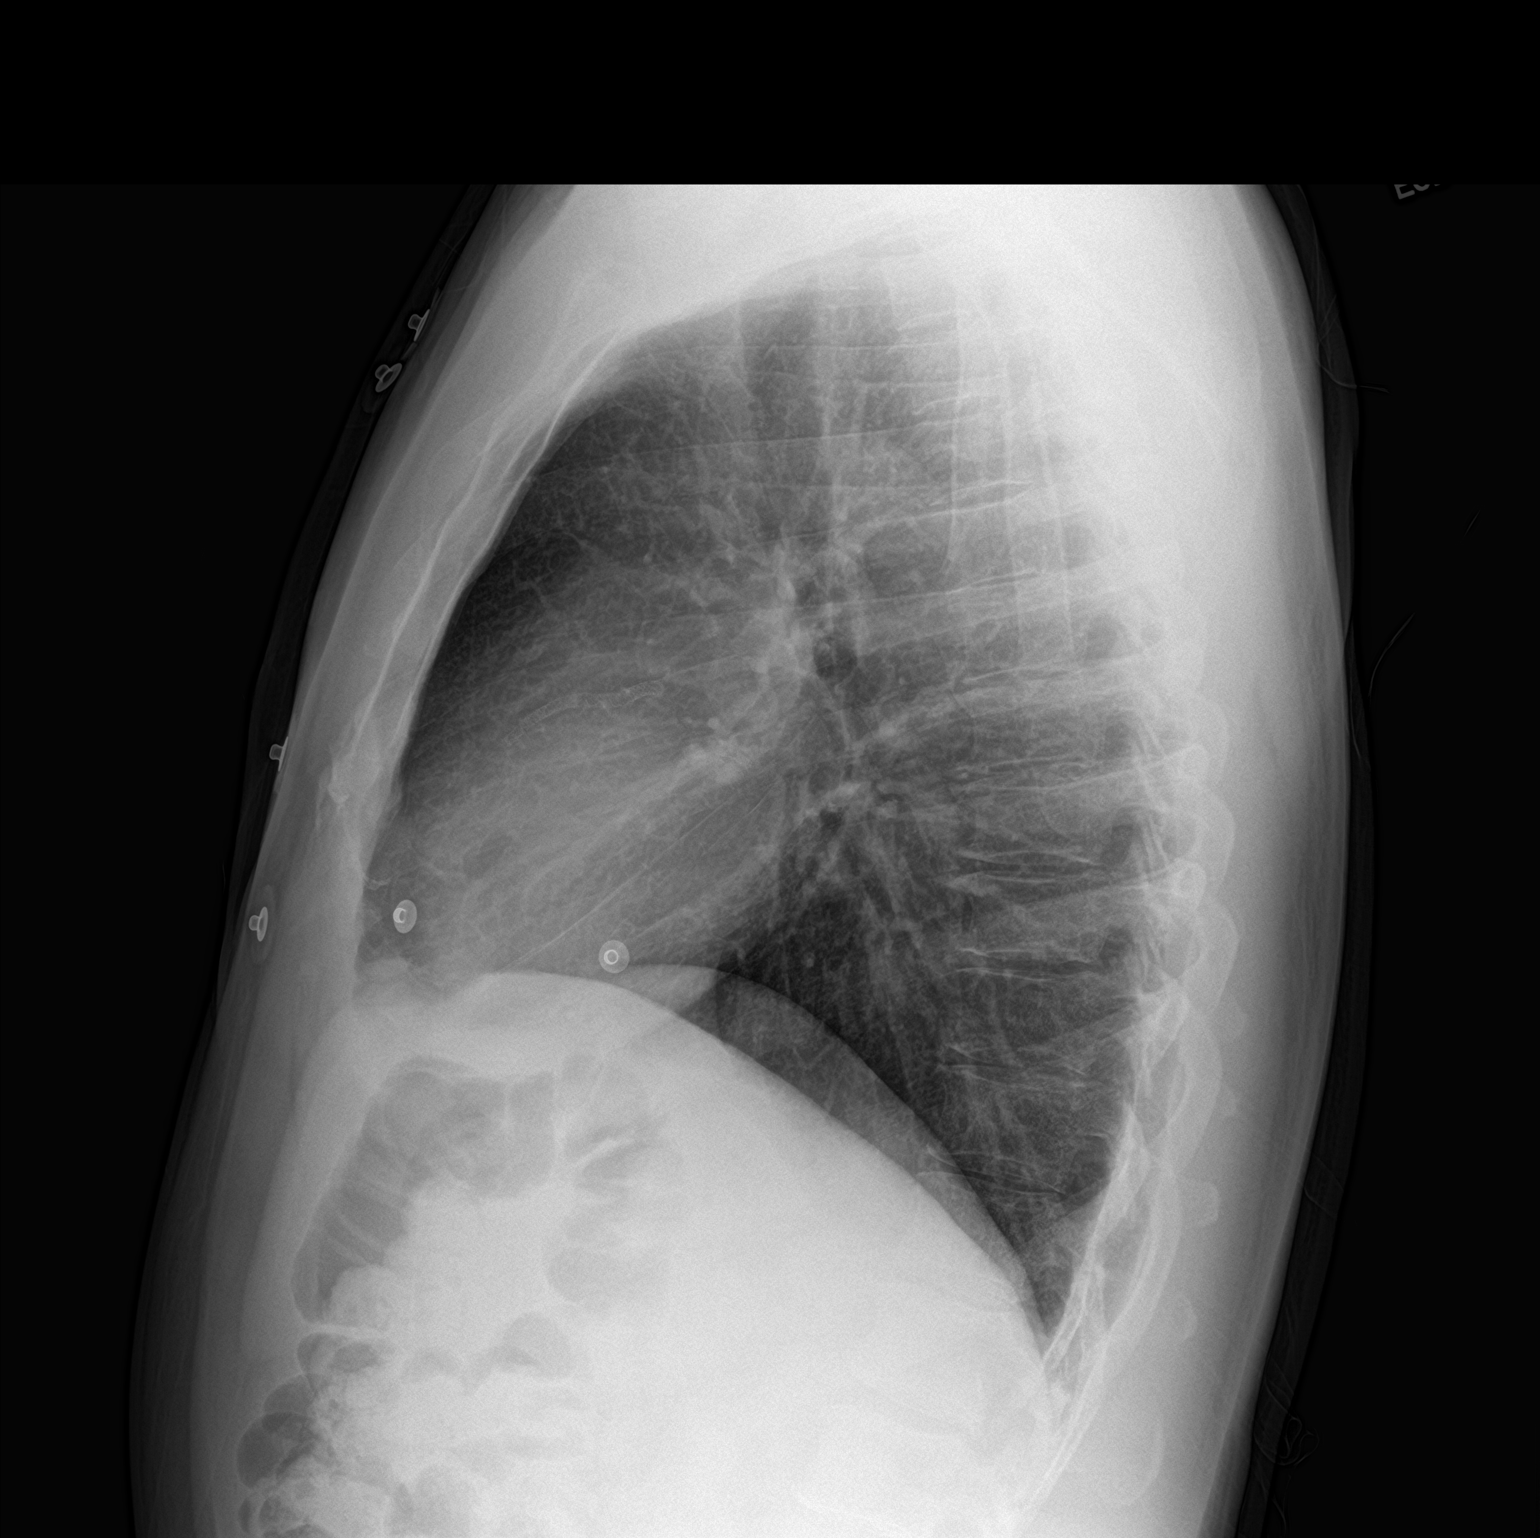

[2 of 2 positions shown; findings below may reference images not displayed]

FINDINGS: Lungs are clear. No pneumothorax or pleural effusion. Coronary
artery stenting has been performed. Cardiac size is within normal
limits. The pulmonary vascularity is normal. No acute bone
abnormality.
IMPRESSION: No active cardiopulmonary disease.

## 2022-07-03 NOTE — Progress Notes (Unsigned)
Cardiology Clinic Note   Date: 07/04/2022 ID: Warrior, Gaertner May 15, 1965, MRN [id number]  Primary Cardiologist:  [name], MD  Patient Profile    [name] [name] is a 57 y.o. male who presents to the clinic today for 1 year follow-up.   Past medical history significant for: CAD.  LHC 05/29/2019 (STEMI): Severe three-vessel CAD.  Culprit lesion proximal to distal LAD 100%.  PCI with DES to proximal to mid LAD.  Following stent placement there was distal edge tear focal 90% stenosis requiring DES placed to the initial stent to cover downstream anterior.  Third DES placed overlapping proximately to cover the full lesion.  OM2 90%.  PCI with DES.  RPDA 100% with distal segments somewhat filled via left-to-right collaterals.  This is felt to be CTO with plan to treat medically.  LVEF 35 to 40%.  Moderately elevated LVEDP. LHC 08/20/2020 (unstable angina): Stable three-vessel CAD.  Patent stents proximal to mid LAD.  Patent OM2 stent with minimal LCx disease.  No mid RCA CTO after RV marginal branch.  Distal RCA fills via bridging collaterals right to right from RV marginal and left-to-right from distal LCx and D2.  Normal EF and LVEDP. Ischemic cardiomyopathy.  Echo 09/24/2019: EF 55 to 60%.  Grade II DD.  Hypokinesis of mid apical inferoseptal wall.  Akinesis apical anterior wall and mid anteroseptal wall.  Normal RV function.  Mild aortic valve sclerosis without stenosis. Hypertension.  Hyperlipidemia.  Lipid panel 05/23/2022: LDL 52, HDL 39, TG 60, total 103.   History of Present Illness    [name] [name] was first evaluated by cardiology on 05/29/2019 for STEMI.  Patient had a several day history of intermittent exertional and rest chest pain for which he presented to urgent care.  EKG showed subtle ST elevation and he was transported to the ED via EMS.  Upon arrival of EMS patient was showing significant anterior septal ST elevations with diffuse inferolateral ST segment  depressions.  He was brought emergently to the Cath Lab and underwent PCI (details above).  He continues to be followed by Dr. Herbie Baltimore for the above outlined history.  Patient was last seen in the office by Dr. Herbie Baltimore on 06/07/2021 for follow-up.  He was doing well at that time and no changes were made.  Today, patient is here alone. He denies shortness of breath or dyspnea on exertion. No chest pain, pressure, or tightness. Denies lower extremity edema, orthopnea, or PND. No palpitations. He  will occasionally feel like he needs to take a deep breath while he is at rest.  Discussed this can happen on Brilinta and he states Dr. Herbie Baltimore had told him the same. He does not feel it happens frequently enough to be bothersome. He normally walks for exercise but has been doing that less secondary to performing a lot of yard work and some renovations for his wife.      ROS: All other systems reviewed and are otherwise negative except as noted in History of Present Illness.  Studies Reviewed    ECG personally reviewed by me today: NSR, 61 bpm.  No significant changes from 06/07/2021.          Physical Exam    VS:  BP 112/80   Pulse 61   Ht 5\' 7"  (1.702 m)   Wt 177 lb 12.8 oz (80.6 kg)   SpO2 95%   BMI 27.85 kg/m  , BMI Body mass index is 27.85 kg/m.  GEN: Well nourished,  well developed, in no acute distress. Neck: No JVD or carotid bruits. Cardiac:  RRR. No murmurs. No rubs or gallops.   Respiratory:  Respirations regular and unlabored. Clear to auscultation without rales, wheezing or rhonchi. GI: Soft, nontender, nondistended. Extremities: Radials/DP/PT 2+ and equal bilaterally. No clubbing or cyanosis. No edema.  Skin: Warm and dry, no rash. Neuro: Strength intact.  Assessment & Plan    CAD.  S/p PCI with DES x 3 to proximal to mid LAD and DES to OM 17 May 2019.  LHC July 2022 showed stable three-vessel CAD with patent stents.  Patient denies chest pain, pressure, tightness. He walks  for exercise and performs yard work and home renovations.  Continue atorvastatin, Zetia, carvedilol, Brilinta, as needed SL NTG. Ischemic cardiomyopathy.  EF 35 to 40% in the setting of STEMI April 2021.  Follow-up echo August 2021 showed normal LV function, Grade II DD.  Patient denies lower extremity edema, shortness of breath, DOE, orthopnea or PND.  Euvolemic and well compensated on exam.  Continue carvedilol and losartan. Hypertension. BP today 112/80.  Patient denies headaches, dizziness or vision changes. Continue carvedilol and losartan. Hyperlipidemia.  LDL April 2024 52, at goal.  Continue atorvastatin and Zetia.  Disposition: Renew NTG. Return in 1 year or sooner as needed.          Signed, Etta Grandchild. Shaleta Ruacho, DNP, NP-C

## 2022-07-04 ENCOUNTER — Encounter: Payer: Self-pay | Admitting: Student

## 2022-07-04 ENCOUNTER — Ambulatory Visit: Payer: BC Managed Care – PPO | Attending: Student | Admitting: Student

## 2022-07-04 VITALS — BP 112/80 | HR 61 | Ht 67.0 in | Wt 177.8 lb

## 2022-07-04 DIAGNOSIS — I2510 Atherosclerotic heart disease of native coronary artery without angina pectoris: Secondary | ICD-10-CM | POA: Diagnosis not present

## 2022-07-04 DIAGNOSIS — E785 Hyperlipidemia, unspecified: Secondary | ICD-10-CM | POA: Diagnosis not present

## 2022-07-04 DIAGNOSIS — I10 Essential (primary) hypertension: Secondary | ICD-10-CM

## 2022-07-04 DIAGNOSIS — I255 Ischemic cardiomyopathy: Secondary | ICD-10-CM | POA: Diagnosis not present

## 2022-07-04 MED ORDER — NITROGLYCERIN 0.4 MG SL SUBL: 0.4000 mg | SUBLINGUAL_TABLET | SUBLINGUAL | 3 refills | Status: AC | PRN

## 2022-07-04 NOTE — Patient Instructions (Signed)
Medication Instructions:  Your physician recommends that you continue on your current medications as directed. Please refer to the Current Medication list given to you today.  *If you need a refill on your cardiac medications before your next appointment, please call your pharmacy*   Lab Work: NONE If you have labs (blood work) drawn today and your tests are completely normal, you will receive your results only by: MyChart Message (if you have MyChart) OR A paper copy in the mail If you have any lab test that is abnormal or we need to change your treatment, we will call you to review the results.   Testing/Procedures: NONE   Follow-Up: At  [facility], you and your health needs are our priority.  As part of our continuing mission to provide you with exceptional heart care, we have created designated Provider Care Teams.  These Care Teams include your primary Cardiologist (physician) and Advanced Practice Providers (APPs -  Physician Assistants and Nurse Practitioners) who all work together to provide you with the care you need, when you need it.  We recommend signing up for the patient portal called "MyChart".  Sign up information is provided on this After Visit Summary.  MyChart is used to connect with patients for Virtual Visits (Telemedicine).  Patients are able to view lab/test results, encounter notes, upcoming appointments, etc.  Non-urgent messages can be sent to your provider as well.   To learn more about what you can do with MyChart, go to [email or web address].    Your next appointment:   1 year(s)  Provider:   David Harding, MD    

## 2022-07-05 ENCOUNTER — Other Ambulatory Visit (INDEPENDENT_AMBULATORY_CARE_PROVIDER_SITE_OTHER): Payer: BC Managed Care – PPO

## 2022-07-05 DIAGNOSIS — I2510 Atherosclerotic heart disease of native coronary artery without angina pectoris: Secondary | ICD-10-CM | POA: Diagnosis not present

## 2022-07-19 DIAGNOSIS — L728 Other follicular cysts of the skin and subcutaneous tissue: Secondary | ICD-10-CM | POA: Diagnosis not present

## 2022-07-19 DIAGNOSIS — L298 Other pruritus: Secondary | ICD-10-CM | POA: Diagnosis not present

## 2022-08-17 ENCOUNTER — Other Ambulatory Visit: Payer: Self-pay | Admitting: Cardiology

## 2022-09-03 ENCOUNTER — Other Ambulatory Visit: Payer: Self-pay | Admitting: Cardiology

## 2022-09-06 ENCOUNTER — Ambulatory Visit: Payer: BC Managed Care – PPO | Admitting: Cardiology

## 2022-09-11 ENCOUNTER — Other Ambulatory Visit: Payer: Self-pay | Admitting: Cardiology

## 2022-11-07 ENCOUNTER — Other Ambulatory Visit (HOSPITAL_BASED_OUTPATIENT_CLINIC_OR_DEPARTMENT_OTHER): Payer: Self-pay

## 2022-12-06 ENCOUNTER — Other Ambulatory Visit: Payer: Self-pay | Admitting: Cardiology

## 2022-12-07 ENCOUNTER — Other Ambulatory Visit: Payer: Self-pay | Admitting: Cardiology

## 2023-02-16 ENCOUNTER — Ambulatory Visit: Payer: BLUE CROSS/BLUE SHIELD | Admitting: Family Medicine

## 2023-02-16 ENCOUNTER — Encounter: Payer: Self-pay | Admitting: Family Medicine

## 2023-02-16 VITALS — BP 122/82 | HR 62 | Temp 97.1°F | Ht 67.0 in | Wt 183.0 lb

## 2023-02-16 DIAGNOSIS — R7303 Prediabetes: Secondary | ICD-10-CM | POA: Diagnosis not present

## 2023-02-16 DIAGNOSIS — I209 Angina pectoris, unspecified: Secondary | ICD-10-CM | POA: Insufficient documentation

## 2023-02-16 DIAGNOSIS — R079 Chest pain, unspecified: Secondary | ICD-10-CM

## 2023-02-16 NOTE — Progress Notes (Signed)
 Established Patient Office Visit   Subjective:  Patient ID: [name] [name], male    DOB: Jun 16, 1965  Age: 58 y.o. MRN: [id number]  No chief complaint on file.   HPI Encounter Diagnoses  Name Primary?   Chest pain, unspecified type Yes   Angina pectoris (HCC)    Prediabetes    3-week history of midepigastric pain radiating up into the chest.  There has been associated shortness of breath.  He has noticed this with exertion and at rest.  His last episode of chest pain was yesterday.  A sublingual nitroglycerin  helped.  He experienced shortness of breath today at work.  He has no history of reflux.  History of three-vessel coronary artery disease status post stenting.  He is compliant with all of his medications.  Last LDL cholesterol was 52 with an LDL of 39.   Review of Systems  Constitutional: Negative.   HENT: Negative.    Eyes:  Negative for blurred vision, discharge and redness.  Respiratory:  Positive for shortness of breath.   Cardiovascular:  Positive for chest pain.  Gastrointestinal:  Negative for abdominal pain.  Genitourinary: Negative.   Musculoskeletal: Negative.  Negative for myalgias.  Skin:  Negative for rash.  Neurological:  Negative for tingling, loss of consciousness and weakness.  Endo/Heme/Allergies:  Negative for polydipsia.     Current Outpatient Medications:    atorvastatin  (LIPITOR ) 40 MG tablet, TAKE 1 TABLET BY MOUTH EVERY DAY, Disp: 90 tablet, Rfl: 3   carvedilol  (COREG ) 3.125 MG tablet, TAKE 1 TABLET BY MOUTH 2 TIMES DAILY., Disp: 180 tablet, Rfl: 3   diclofenac  Sodium (VOLTAREN ) 1 % GEL, Apply a small grape sized dollop onto affected joint up to 4 times daily., Disp: 150 g, Rfl: 1   ezetimibe  (ZETIA ) 10 MG tablet, TAKE 1 TABLET BY MOUTH EVERY DAY, Disp: 90 tablet, Rfl: 3   flunisolide  (NASALIDE ) 25 MCG/ACT (0.025%) SOLN, PLACE 2 SPRAYS INTO THE NOSE 2 (TWO) TIMES DAILY., Disp: 75 mL, Rfl: 1   losartan  (COZAAR ) 25 MG tablet, TAKE 1/2 TABLET BY  MOUTH DAILY, Disp: 90 tablet, Rfl: 3   nitroGLYCERIN  (NITROSTAT ) 0.4 MG SL tablet, Place 1 tablet (0.4 mg total) under the tongue every 5 (five) minutes as needed for chest pain (up to 3 doses. If taking 3rd dose, call 911.)., Disp: 25 tablet, Rfl: 3   sildenafil  (VIAGRA ) 100 MG tablet, Take 0.5-1 tablets (50-100 mg total) by mouth daily as needed for erectile dysfunction., Disp: 5 tablet, Rfl: 11   ticagrelor  (BRILINTA ) 60 MG TABS tablet, TAKE 1 TABLET BY MOUTH TWICE A DAY, Disp: 180 tablet, Rfl: 2   Objective:     BP 122/82   Pulse 62   Temp (!) 97.1 F (36.2 C)   Ht 5' 7 (1.702 m)   Wt 183 lb (83 kg)   SpO2 99%   BMI 28.66 kg/m    Physical Exam Constitutional:      General: He is not in acute distress.    Appearance: Normal appearance. He is not ill-appearing, toxic-appearing or diaphoretic.  HENT:     Head: Normocephalic and atraumatic.     Right Ear: External ear normal.     Left Ear: External ear normal.  Eyes:     General: No scleral icterus.       Right eye: No discharge.        Left eye: No discharge.     Extraocular Movements: Extraocular movements intact.     Conjunctiva/sclera: Conjunctivae  normal.  Cardiovascular:     Rate and Rhythm: Normal rate and regular rhythm.  Pulmonary:     Effort: Pulmonary effort is normal. No respiratory distress.     Breath sounds: No wheezing or rales.  Skin:    General: Skin is warm and dry.  Neurological:     Mental Status: He is alert and oriented to person, place, and time.  Psychiatric:        Mood and Affect: Mood normal.        Behavior: Behavior normal.      No results found for any visits on 02/16/23.    The ASCVD Risk score (Arnett DK, et al., 2019) failed to calculate for the following reasons:   Risk score cannot be calculated because patient has a medical history suggesting prior/existing ASCVD    Assessment & Plan:   Chest pain, unspecified type -     EKG 12-Lead  Angina pectoris  (HCC)  Prediabetes    Return To ER now!.  EKG today shows regular rate and rhythm with a rate of 59.  Nonspecific ST changes in aVL and 3.  Patient advised to proceed directly to the emergency room for cardiac workup.  Elsie Sim Lent, MD

## 2023-02-18 ENCOUNTER — Other Ambulatory Visit: Payer: Self-pay | Admitting: Cardiology

## 2023-02-21 IMAGING — DX DG KNEE COMPLETE 4+V*R*
4 series · 4 of 4 positions shown · non-contrast
Comparison: None.

CLINICAL DATA: Medial tibial pain

EXAM:
RIGHT KNEE - COMPLETE 4+ VIEW

[knee ap]
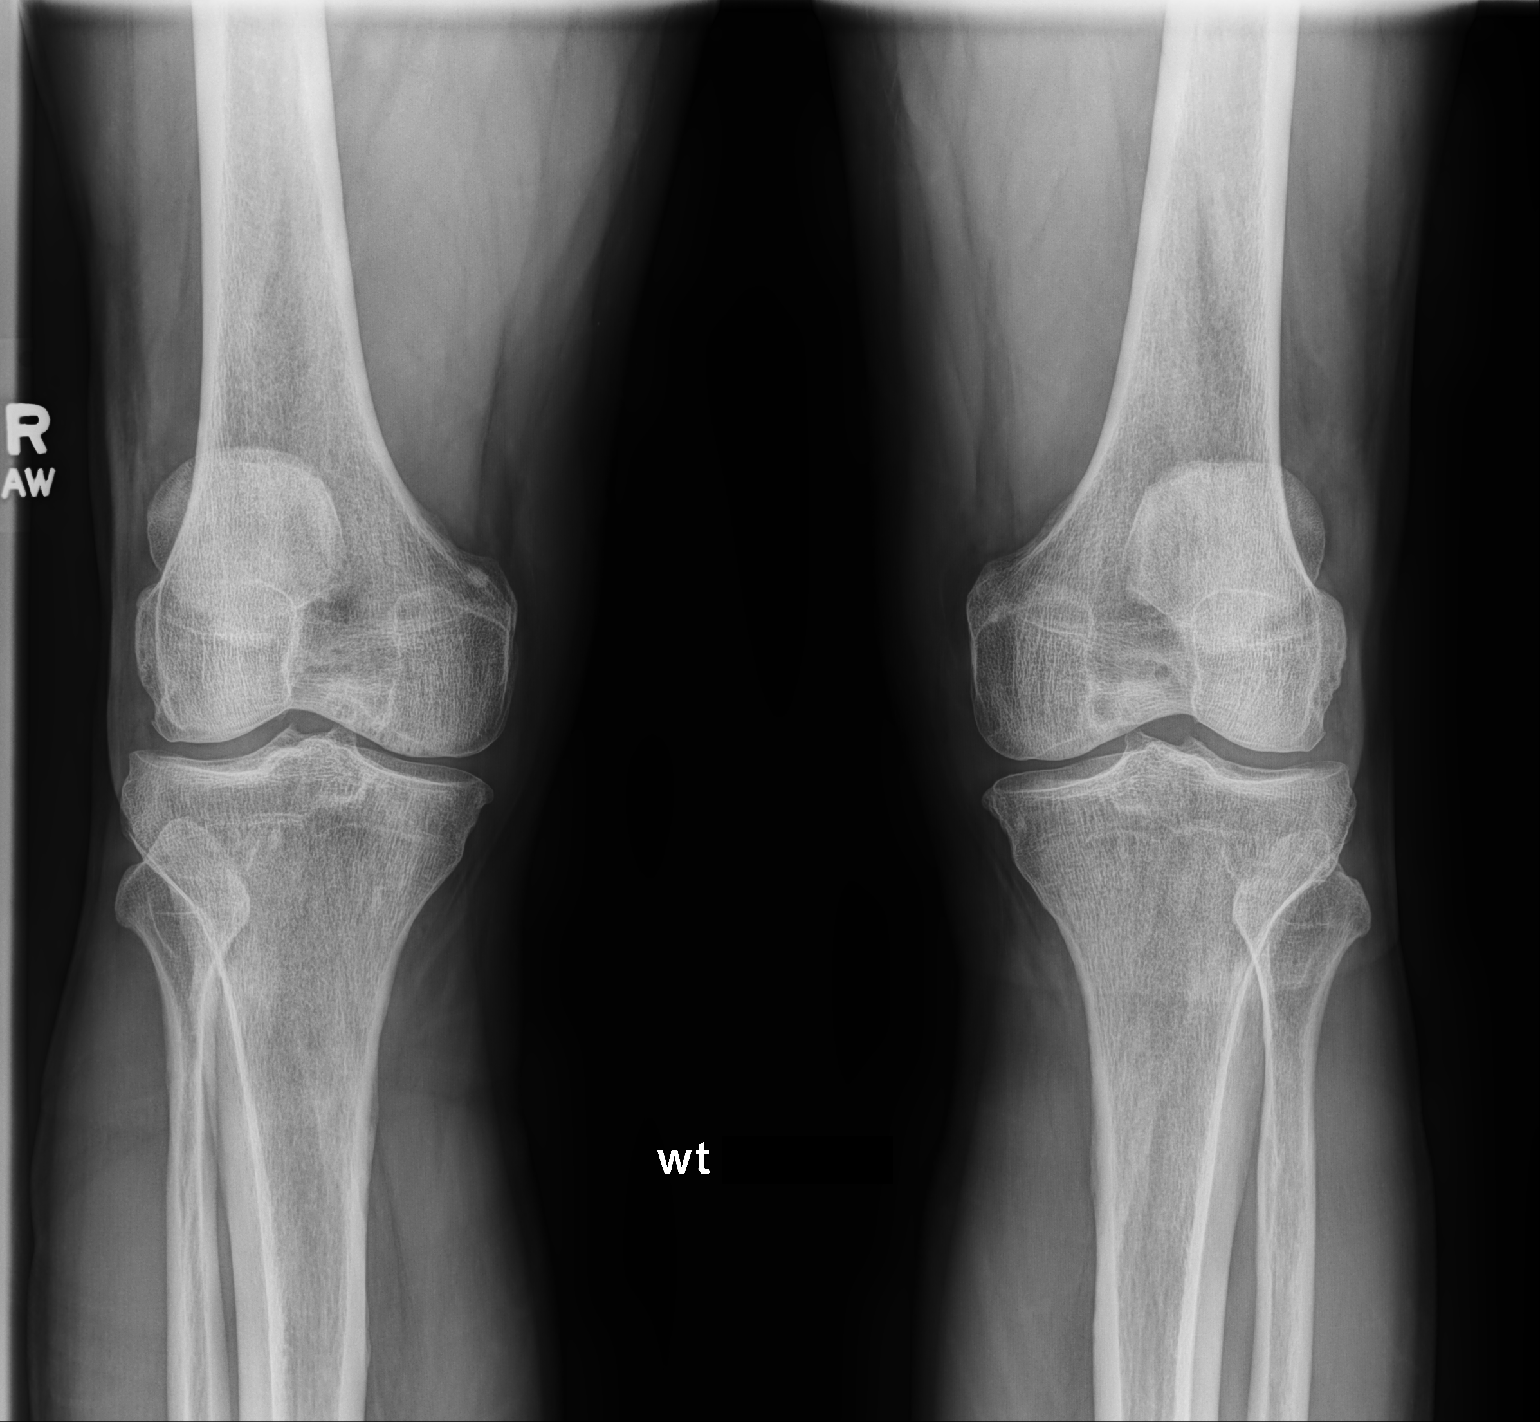

[knee [person_name] view pa]
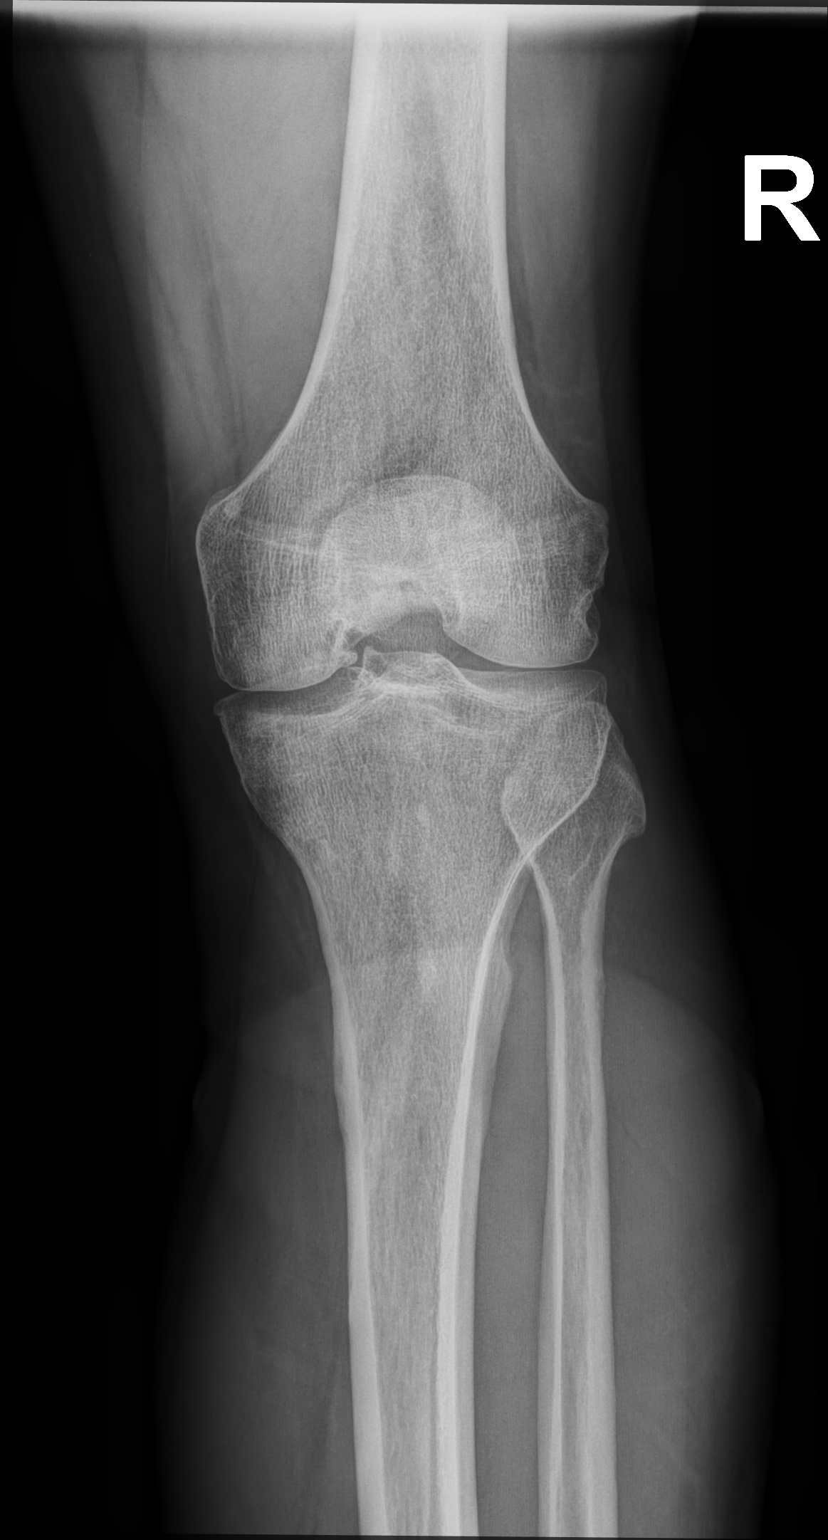

[knee lat]
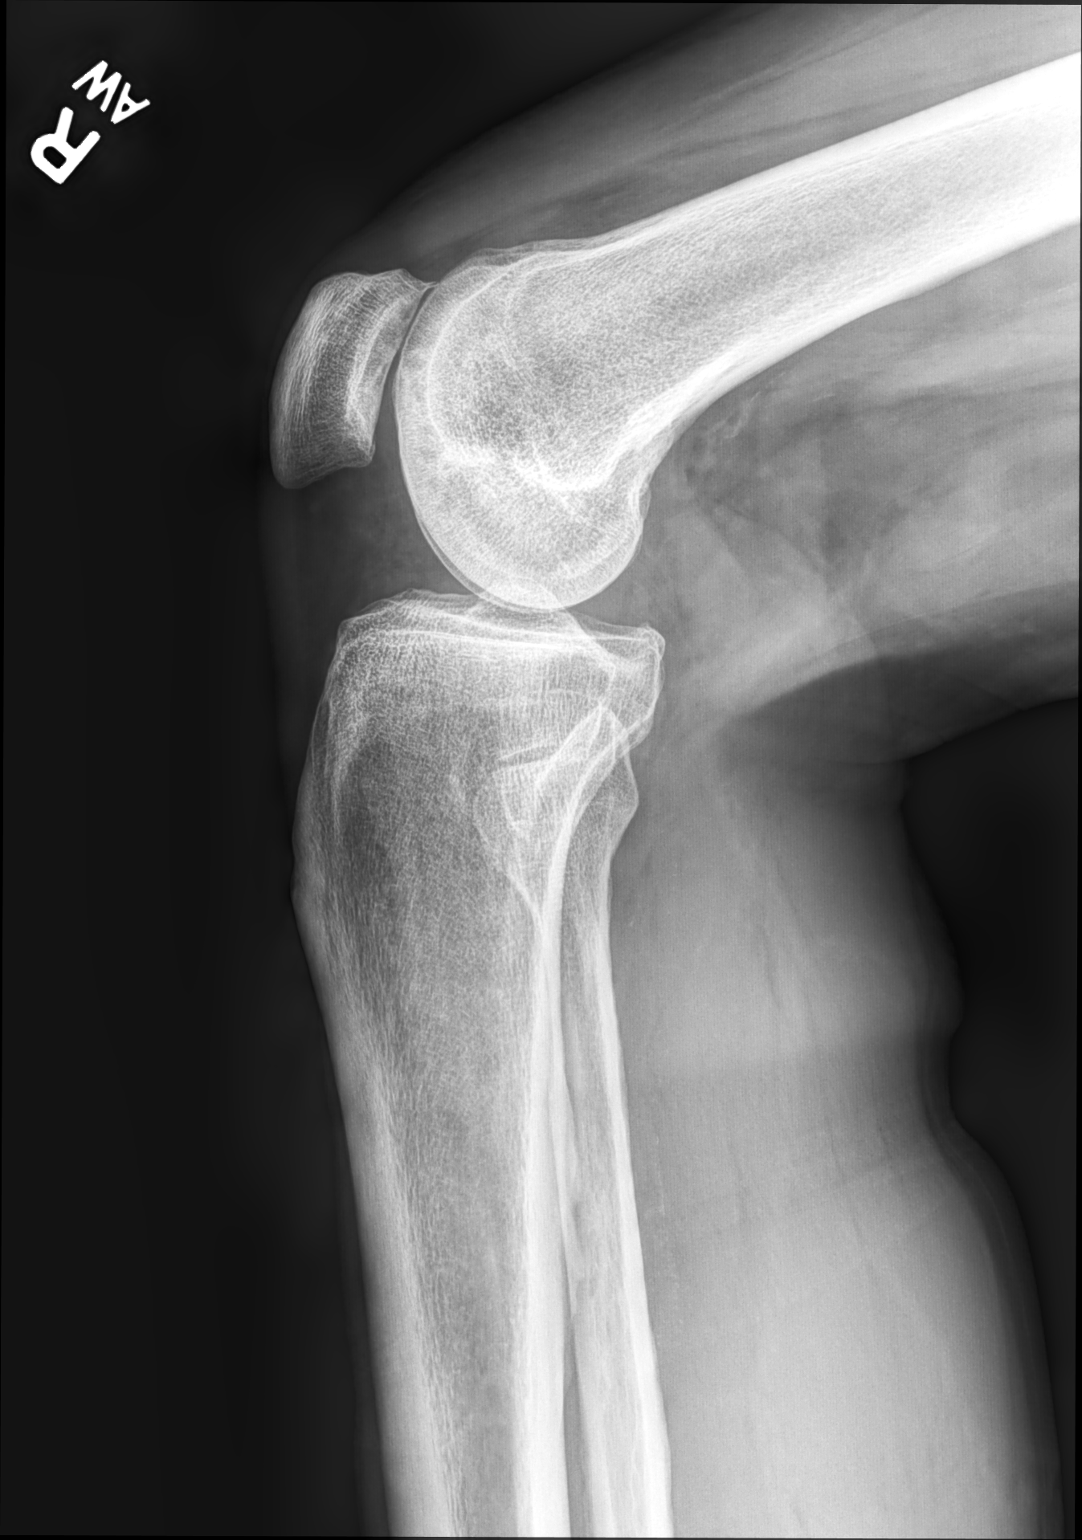

[patella (sunrise)]
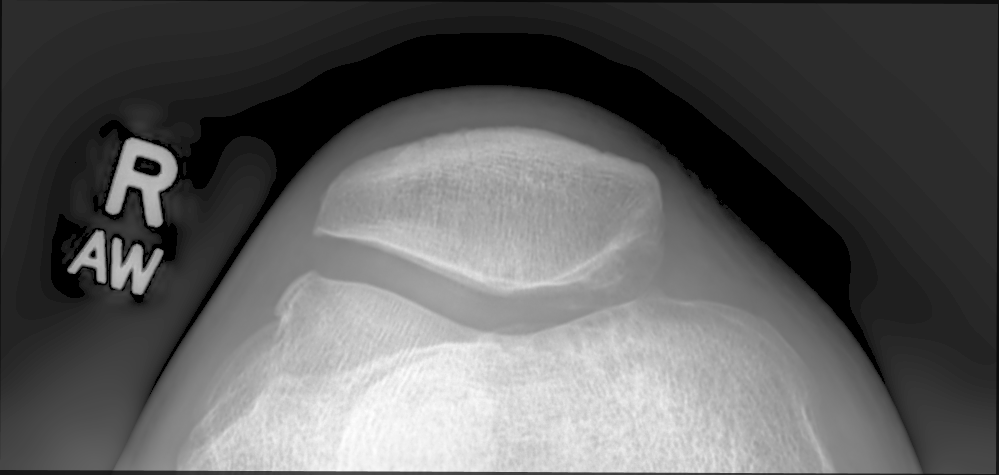

[4 of 4 positions shown; findings below may reference images not displayed]

FINDINGS: Mild medial and lateral joint space degenerative change. Mild
patellofemoral degenerative change. Trace knee effusion. On
weight-bearing view, there is mild medial joint space degenerative
change of the left knee
IMPRESSION: Mild tricompartment degenerative change with trace knee effusion. No
acute osseous abnormality.

## 2023-03-03 ENCOUNTER — Telehealth: Payer: Self-pay | Admitting: Family Medicine

## 2023-03-03 NOTE — Telephone Encounter (Signed)
 ERROR

## 2023-03-10 ENCOUNTER — Telehealth: Payer: Self-pay | Admitting: Family Medicine

## 2023-03-10 NOTE — Telephone Encounter (Signed)
error

## 2023-03-14 DIAGNOSIS — R059 Cough, unspecified: Secondary | ICD-10-CM | POA: Diagnosis not present

## 2023-03-14 DIAGNOSIS — R0981 Nasal congestion: Secondary | ICD-10-CM | POA: Diagnosis not present

## 2023-03-14 DIAGNOSIS — M791 Myalgia, unspecified site: Secondary | ICD-10-CM | POA: Diagnosis not present

## 2023-03-14 DIAGNOSIS — R509 Fever, unspecified: Secondary | ICD-10-CM | POA: Diagnosis not present

## 2023-03-14 DIAGNOSIS — R519 Headache, unspecified: Secondary | ICD-10-CM | POA: Diagnosis not present

## 2023-05-26 ENCOUNTER — Encounter: Payer: BC Managed Care – PPO | Admitting: Family Medicine

## 2023-05-26 ENCOUNTER — Encounter: Payer: Self-pay | Admitting: Family Medicine

## 2023-05-26 ENCOUNTER — Ambulatory Visit (INDEPENDENT_AMBULATORY_CARE_PROVIDER_SITE_OTHER): Payer: BLUE CROSS/BLUE SHIELD | Admitting: Family Medicine

## 2023-05-26 VITALS — BP 114/68 | HR 67 | Temp 97.3°F | Ht 67.0 in | Wt 178.0 lb

## 2023-05-26 DIAGNOSIS — Z125 Encounter for screening for malignant neoplasm of prostate: Secondary | ICD-10-CM | POA: Diagnosis not present

## 2023-05-26 DIAGNOSIS — R0982 Postnasal drip: Secondary | ICD-10-CM | POA: Diagnosis not present

## 2023-05-26 DIAGNOSIS — R7303 Prediabetes: Secondary | ICD-10-CM | POA: Diagnosis not present

## 2023-05-26 DIAGNOSIS — Z0000 Encounter for general adult medical examination without abnormal findings: Secondary | ICD-10-CM | POA: Diagnosis not present

## 2023-05-26 DIAGNOSIS — Z8679 Personal history of other diseases of the circulatory system: Secondary | ICD-10-CM

## 2023-05-26 LAB — CBC WITH DIFFERENTIAL/PLATELET
Basophils Absolute: 0 10*3/uL (ref 0.0–0.1)
Basophils Relative: 0.7 % (ref 0.0–3.0)
Eosinophils Absolute: 0.3 10*3/uL (ref 0.0–0.7)
Eosinophils Relative: 4.5 % (ref 0.0–5.0)
HCT: 44.1 % (ref 39.0–52.0)
Hemoglobin: 15.2 g/dL (ref 13.0–17.0)
Lymphocytes Relative: 27.2 % (ref 12.0–46.0)
Lymphs Abs: 1.6 10*3/uL (ref 0.7–4.0)
MCHC: 34.6 g/dL (ref 30.0–36.0)
MCV: 91.9 fl (ref 78.0–100.0)
Monocytes Absolute: 0.7 10*3/uL (ref 0.1–1.0)
Monocytes Relative: 12.2 % — ABNORMAL HIGH (ref 3.0–12.0)
Neutro Abs: 3.3 10*3/uL (ref 1.4–7.7)
Neutrophils Relative %: 55.4 % (ref 43.0–77.0)
Platelets: 239 10*3/uL (ref 150.0–400.0)
RBC: 4.8 Mil/uL (ref 4.22–5.81)
RDW: 12.7 % (ref 11.5–15.5)
WBC: 5.9 10*3/uL (ref 4.0–10.5)

## 2023-05-26 LAB — URINALYSIS, ROUTINE W REFLEX MICROSCOPIC
Bilirubin Urine: NEGATIVE
Hgb urine dipstick: NEGATIVE
Ketones, ur: NEGATIVE
Leukocytes,Ua: NEGATIVE
Nitrite: NEGATIVE
RBC / HPF: NONE SEEN (ref 0–?)
Specific Gravity, Urine: <=1.005 — AB (ref 1.000–1.030)
Total Protein, Urine: NEGATIVE
Urine Glucose: NEGATIVE
Urobilinogen, UA: 0.2 (ref 0.0–1.0)
pH: 7 (ref 5.0–8.0)

## 2023-05-26 LAB — LIPID PANEL
Cholesterol: 90 mg/dL (ref 0–200)
HDL: 30.8 mg/dL — ABNORMAL LOW (ref >39.00)
LDL Cholesterol: 45 mg/dL (ref 0–99)
NonHDL: 58.96
Total CHOL/HDL Ratio: 3
Triglycerides: 68 mg/dL (ref 0.0–149.0)
VLDL: 13.6 mg/dL (ref 0.0–40.0)

## 2023-05-26 LAB — COMPREHENSIVE METABOLIC PANEL WITH GFR
ALT: 22 U/L (ref 0–53)
AST: 18 U/L (ref 0–37)
Albumin: 4.6 g/dL (ref 3.5–5.2)
Alkaline Phosphatase: 85 U/L (ref 39–117)
BUN: 10 mg/dL (ref 6–23)
CO2: 29 meq/L (ref 19–32)
Calcium: 9.2 mg/dL (ref 8.4–10.5)
Chloride: 101 meq/L (ref 96–112)
Creatinine, Ser: 0.86 mg/dL (ref 0.40–1.50)
GFR: 96.46 mL/min (ref >60.00)
Glucose, Bld: 96 mg/dL (ref 70–99)
Potassium: 4.1 meq/L (ref 3.5–5.1)
Sodium: 137 meq/L (ref 135–145)
Total Bilirubin: 1 mg/dL (ref 0.2–1.2)
Total Protein: 6.7 g/dL (ref 6.0–8.3)

## 2023-05-26 LAB — PSA: PSA: 0.82 ng/mL (ref 0.10–4.00)

## 2023-05-26 LAB — HEMOGLOBIN A1C: Hgb A1c MFr Bld: 6 % (ref 4.6–6.5)

## 2023-05-26 MED ORDER — AZELASTINE HCL 137 MCG/SPRAY NA SOLN: NASAL | 3 refills | Status: AC

## 2023-05-26 NOTE — Progress Notes (Signed)
 Established Patient Office Visit   Subjective:  Patient ID: [name] [name], male    DOB: [birth year]  Age: 58 y.o. MRN: [id number]  Chief Complaint  Patient presents with   Annual Exam    CPE. Pt is fasting.     HPI Encounter Diagnoses  Name Primary?   Healthcare maintenance Yes   History of CAD (coronary artery disease)    Screening for prostate cancer    Post-nasal drip    Prediabetes    For physical and follow-up of above.  Continues exercising by walking and denies chest pain shortness of breath or nausea.  He does have regular dental care.  He is up-to-date on health maintenance.  Things are well at home.  Continues working full-time.  Continues to moderate simple carbohydrate ingestion and avoid sugary drinks.  Follow-up with cardiology as next month.  Continues Brilinta status postplacement of coronary artery stents 4 years ago.  Denies issues with urine flow or stooling.  Significant suffering from allergy rhinitis with postnasal drip.  He is compliant with his Nasarel.  He had to sleep in a recliner last night secondary to the ongoing postnasal drip.  Eyes have been itchy and watery.  There is ongoing sneezing and itchy throat.   Review of Systems  Constitutional: Negative.   HENT: Negative.    Eyes:  Negative for blurred vision, discharge and redness.  Respiratory: Negative.  Negative for shortness of breath.   Cardiovascular: Negative.  Negative for chest pain.  Gastrointestinal:  Negative for abdominal pain.  Genitourinary: Negative.  Negative for frequency.  Musculoskeletal: Negative.  Negative for myalgias.  Skin:  Negative for rash.  Neurological:  Negative for tingling, loss of consciousness and weakness.  Endo/Heme/Allergies:  Negative for polydipsia.     Current Outpatient Medications:    atorvastatin (LIPITOR) 40 MG tablet, TAKE 1 TABLET BY MOUTH EVERY DAY, Disp: 90 tablet, Rfl: 3   Azelastine HCl 137 MCG/SPRAY SOLN, 1 spray each nostril twice daily.,  Disp: 30 mL, Rfl: 3   carvedilol (COREG) 3.125 MG tablet, TAKE 1 TABLET BY MOUTH TWICE A DAY, Disp: 180 tablet, Rfl: 3   diclofenac Sodium (VOLTAREN) 1 % GEL, Apply a small grape sized dollop onto affected joint up to 4 times daily., Disp: 150 g, Rfl: 1   ezetimibe (ZETIA) 10 MG tablet, TAKE 1 TABLET BY MOUTH EVERY DAY, Disp: 90 tablet, Rfl: 3   flunisolide (NASALIDE) 25 MCG/ACT (0.025%) SOLN, PLACE 2 SPRAYS INTO THE NOSE 2 (TWO) TIMES DAILY., Disp: 75 mL, Rfl: 1   losartan (COZAAR) 25 MG tablet, TAKE 1/2 TABLET BY MOUTH DAILY, Disp: 90 tablet, Rfl: 3   nitroGLYCERIN (NITROSTAT) 0.4 MG SL tablet, Place 1 tablet (0.4 mg total) under the tongue every 5 (five) minutes as needed for chest pain (up to 3 doses. If taking 3rd dose, call 911.)., Disp: 25 tablet, Rfl: 3   sildenafil (VIAGRA) 100 MG tablet, Take 0.5-1 tablets (50-100 mg total) by mouth daily as needed for erectile dysfunction., Disp: 5 tablet, Rfl: 11   ticagrelor (BRILINTA) 60 MG TABS tablet, TAKE 1 TABLET BY MOUTH TWICE A DAY, Disp: 180 tablet, Rfl: 2   Objective:     BP 114/68 (BP Location: Right Arm, Patient Position: Sitting, Cuff Size: Normal)   Pulse 67   Temp (!) 97.3 F (36.3 C) (Temporal)   Ht 5\' 7"  (1.702 m)   Wt 178 lb (80.7 kg)   SpO2 99%   BMI 27.88 kg/m  Physical Exam Constitutional:      General: He is not in acute distress.    Appearance: Normal appearance. He is not ill-appearing, toxic-appearing or diaphoretic.  HENT:     Head: Normocephalic and atraumatic.     Right Ear: Tympanic membrane, ear canal and external ear normal.     Left Ear: Tympanic membrane, ear canal and external ear normal.     Mouth/Throat:     Mouth: Mucous membranes are moist.     Pharynx: Oropharynx is clear. No oropharyngeal exudate or posterior oropharyngeal erythema.   Eyes:     General: No scleral icterus.       Right eye: No discharge.        Left eye: No discharge.     Extraocular Movements: Extraocular movements intact.      Conjunctiva/sclera: Conjunctivae normal.     Pupils: Pupils are equal, round, and reactive to light.  Cardiovascular:     Rate and Rhythm: Normal rate and regular rhythm.  Pulmonary:     Effort: Pulmonary effort is normal. No respiratory distress.     Breath sounds: Normal breath sounds. No wheezing, rhonchi or rales.  Abdominal:     General: Bowel sounds are normal.     Tenderness: There is no abdominal tenderness. There is no guarding or rebound.     Hernia: There is no hernia in the left inguinal area or right inguinal area.  Genitourinary:    Penis: Circumcised. No hypospadias, erythema, tenderness, discharge, swelling or lesions.      Testes:        Right: Mass, tenderness or swelling not present. Right testis is descended.        Left: Mass, tenderness or swelling not present. Left testis is descended.     Epididymis:     Right: Not inflamed or enlarged.     Left: Not inflamed or enlarged.  Musculoskeletal:     Cervical back: No rigidity or tenderness.  Lymphadenopathy:     Cervical: No cervical adenopathy.     Lower Body: No right inguinal adenopathy. No left inguinal adenopathy.  Skin:    General: Skin is warm and dry.  Neurological:     Mental Status: He is alert and oriented to person, place, and time.  Psychiatric:        Mood and Affect: Mood normal.        Behavior: Behavior normal.      No results found for any visits on 05/26/23.    The ASCVD Risk score (Arnett DK, et al., 2019) failed to calculate for the following reasons:   Risk score cannot be calculated because patient has a medical history suggesting prior/existing ASCVD    Assessment & Plan:   Healthcare maintenance -     CBC with Differential/Platelet -     Urinalysis, Routine w reflex microscopic  History of CAD (coronary artery disease) -     Comprehensive metabolic panel with GFR -     Lipid panel  Screening for prostate cancer -     PSA  Post-nasal drip -     Azelastine HCl; 1  spray each nostril twice daily.  Dispense: 30 mL; Refill: 3  Prediabetes -     Comprehensive metabolic panel with GFR -     Hemoglobin A1c    Return in about 6 months (around 11/25/2023), or if symptoms worsen or fail to improve.  Continue healthy active lifestyle.  Continue all medications and cardiology follow-up.  Emphasized the importance  of exercise and weight control to prevent progression of prediabetes and heart disease.  Mliss Sax, MD

## 2023-05-29 IMAGING — DX DG CHEST 2V
2 series · 2 of 2 positions shown · non-contrast
Comparison: 09/23/2019

CLINICAL DATA: Chest pain and shortness of breath for 5 days

EXAM:
CHEST - 2 VIEW

[chest pa]
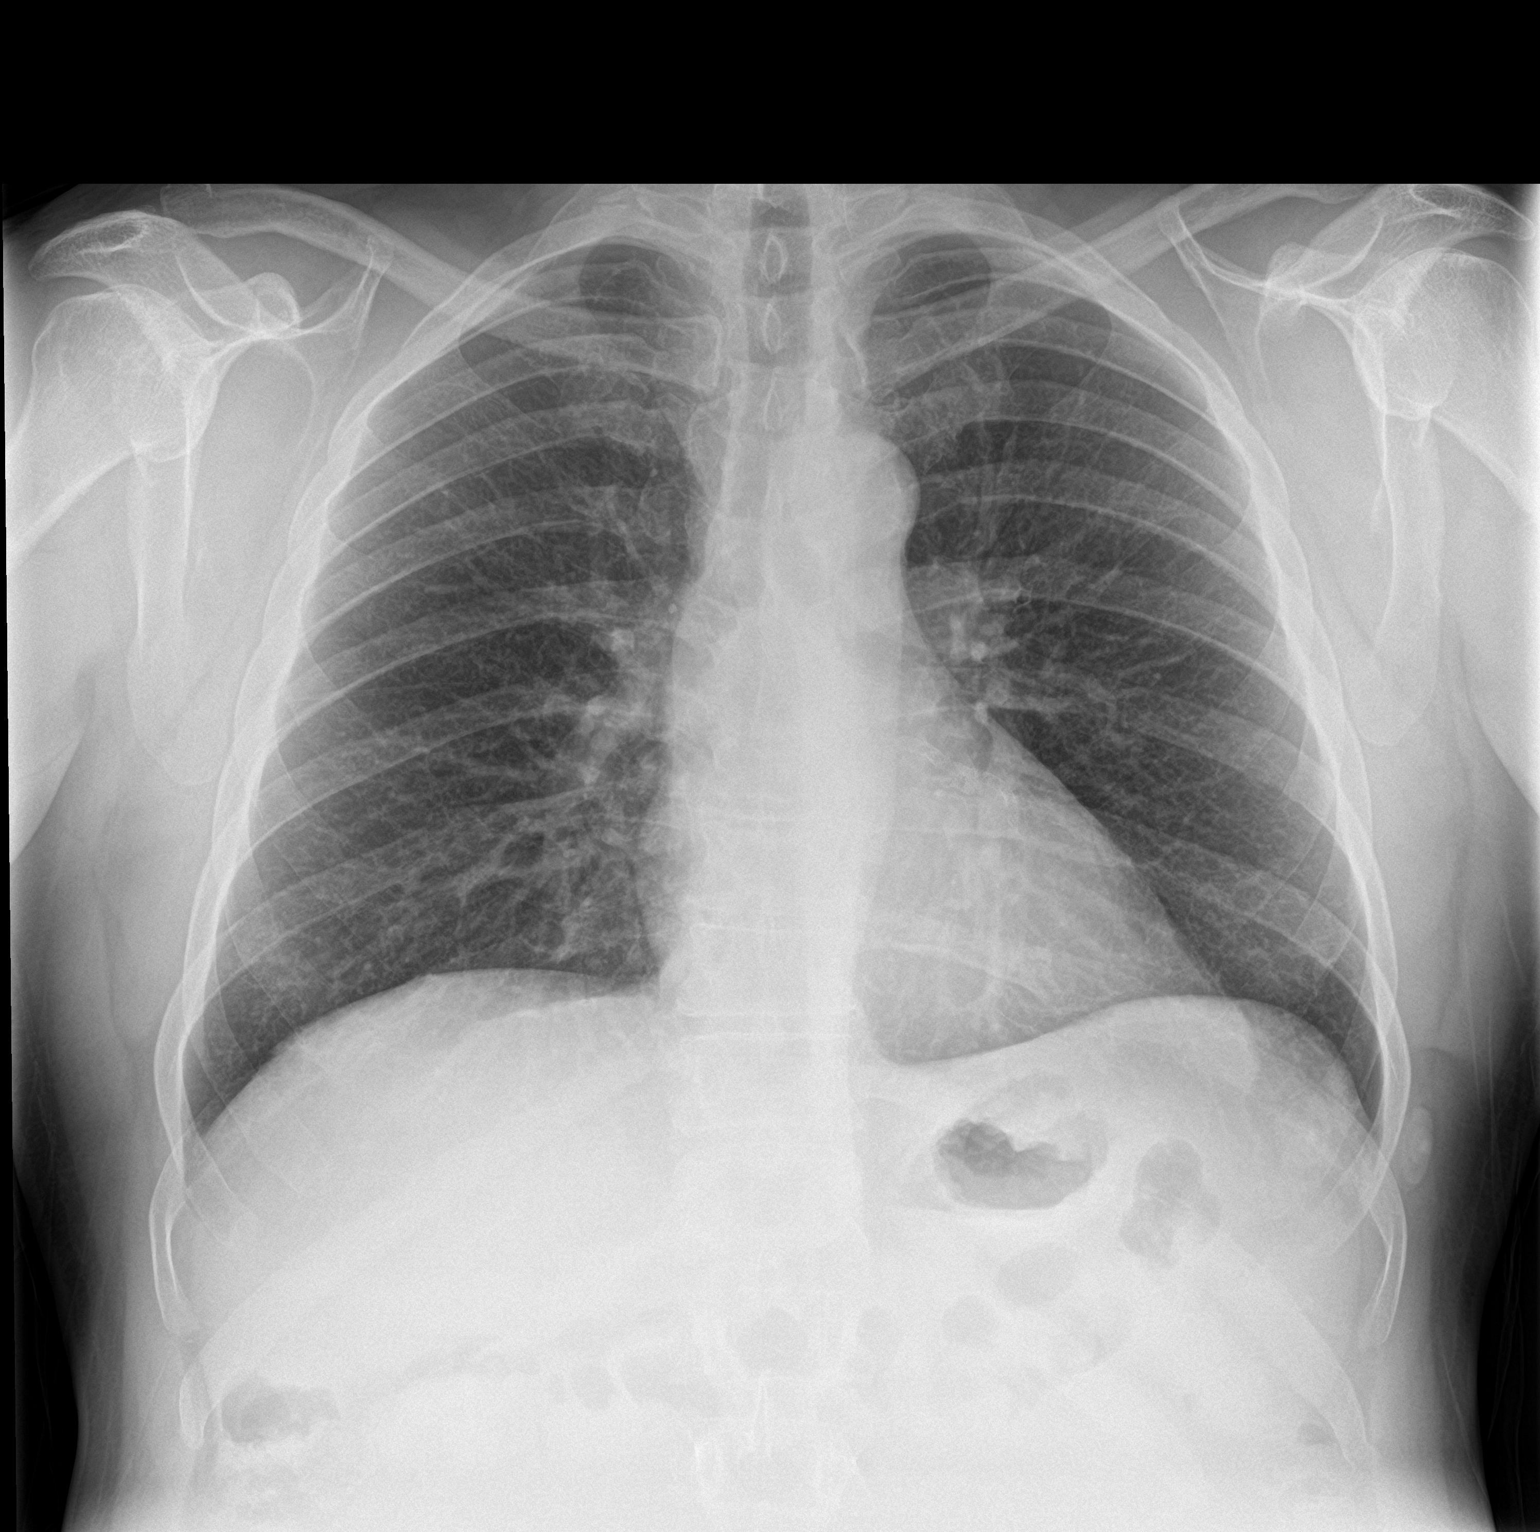

[chest lat]
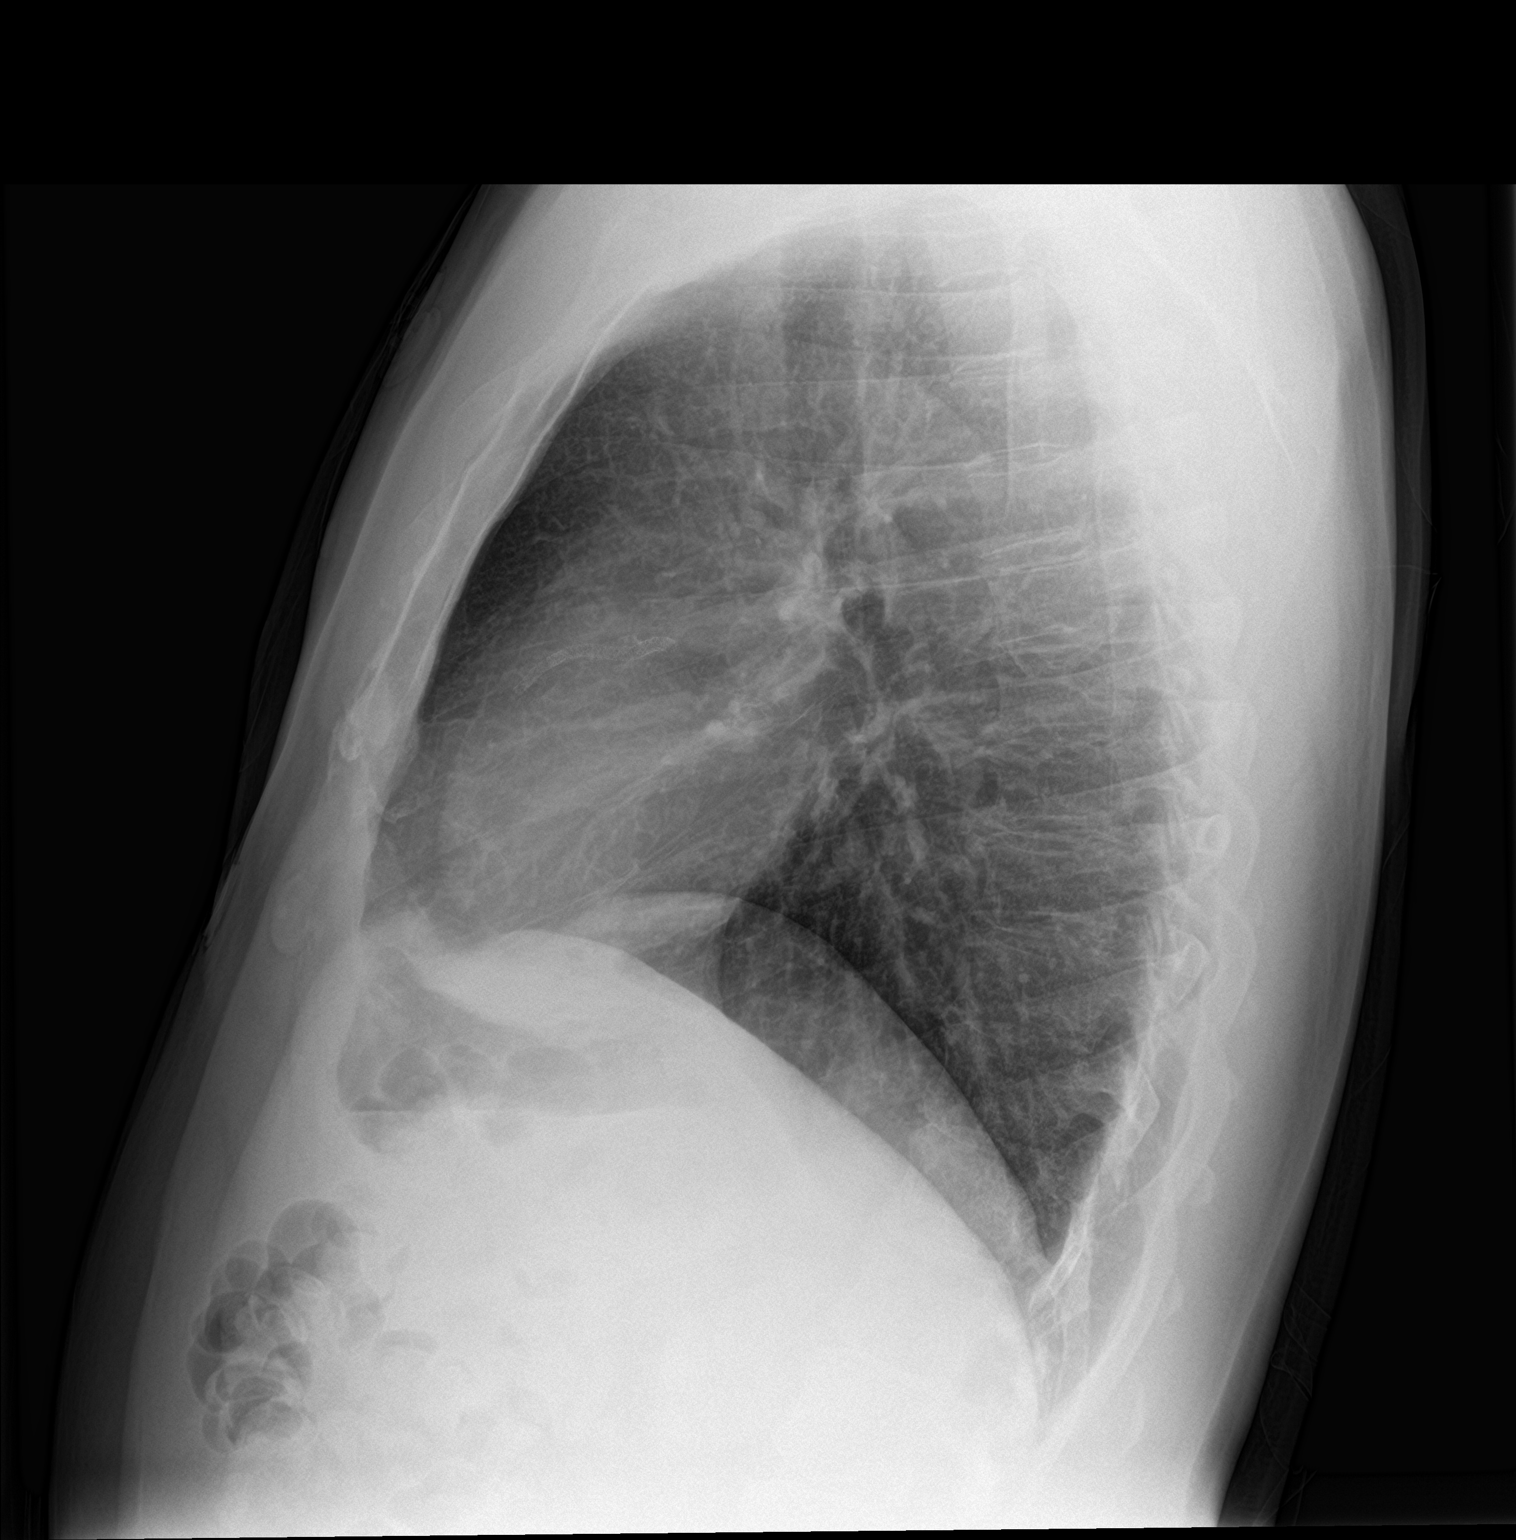

[2 of 2 positions shown; findings below may reference images not displayed]

FINDINGS: The heart size and mediastinal contours are within normal limits.
Both lungs are clear. The visualized skeletal structures are
unremarkable.
IMPRESSION: No active cardiopulmonary disease.

## 2023-06-28 ENCOUNTER — Ambulatory Visit: Payer: BLUE CROSS/BLUE SHIELD | Attending: Cardiology | Admitting: Cardiology

## 2023-06-28 ENCOUNTER — Encounter: Payer: Self-pay | Admitting: Cardiology

## 2023-06-28 VITALS — BP 94/64 | HR 55 | Ht 68.0 in | Wt 180.0 lb

## 2023-06-28 DIAGNOSIS — E785 Hyperlipidemia, unspecified: Secondary | ICD-10-CM

## 2023-06-28 DIAGNOSIS — I10 Essential (primary) hypertension: Secondary | ICD-10-CM

## 2023-06-28 DIAGNOSIS — I2102 ST elevation (STEMI) myocardial infarction involving left anterior descending coronary artery: Secondary | ICD-10-CM | POA: Diagnosis not present

## 2023-06-28 DIAGNOSIS — I2510 Atherosclerotic heart disease of native coronary artery without angina pectoris: Secondary | ICD-10-CM

## 2023-06-28 DIAGNOSIS — I255 Ischemic cardiomyopathy: Secondary | ICD-10-CM | POA: Diagnosis not present

## 2023-06-28 DIAGNOSIS — Z9861 Coronary angioplasty status: Secondary | ICD-10-CM

## 2023-06-28 NOTE — Patient Instructions (Signed)
 Medication Instructions:  - STOP LOSARTAN .   WAIT 3 WEEKS THEN CHECK BLOOD PRESSURE DAILY. If blood pressure is elevated consistently contact our office to restart losartan .    *If you need a refill on your cardiac medications before your next appointment, please call your pharmacy*   Lab Work: None    If you have labs (blood work) drawn today and your tests are completely normal, you will receive your results only by: MyChart Message (if you have MyChart) OR A paper copy in the mail If you have any lab test that is abnormal or we need to change your treatment, we will call you to review the results.   Testing/Procedures: None    Follow-Up: At [facility], you and your health needs are our priority.  As part of our continuing mission to provide you with exceptional heart care, we have created designated Provider Care Teams.  These Care Teams include your primary Cardiologist (physician) and Advanced Practice Providers (APPs -  Physician Assistants and Nurse Practitioners) who all work together to provide you with the care you need, when you need it.  We recommend signing up for the patient portal called "MyChart".  Sign up information is provided on this After Visit Summary.  MyChart is used to connect with patients for Virtual Visits (Telemedicine).  Patients are able to view lab/test results, encounter notes, upcoming appointments, etc.  Non-urgent messages can be sent to your provider as well.   To learn more about what you can do with MyChart, go to [email or web address].    Your next appointment:   1 year(s)  The format for your next appointment:   In Person  Provider:   Randene Bustard, MD   Other Instructions

## 2023-06-28 NOTE — Progress Notes (Signed)
 Cardiology Office Note:  .   Date:  07/01/2023  ID:  [name] [name], DOB [birth year], MRN [id number] PCP: Tonna Frederic, MD  [facility] Providers Cardiologist:  Randene Bustard, MD     Chief Complaint  Patient presents with   Follow-up    Annual follow-up   Coronary Artery Disease    No active angina besides 1 episode before Christmas    Patient Profile: .     [name] [name] is a  58 y.o. male  with a PMH reviewed below who presents here for annual follow-up.  He returns for follow-up at the request of Sanuel, Ladnier,*.  Past medical history significant for: CAD.  LHC 05/29/2019 (STEMI): Severe three-vessel CAD.  Culprit lesion proximal to distal LAD 100%.  PCI with DES to proximal to mid LAD.  Following stent placement there was distal edge tear focal 90% stenosis requiring DES placed to the initial stent to cover downstream anterior.  Third DES placed overlapping proximately to cover the full lesion.  OM2 90%.  PCI with DES.  RPDA 100% with distal segments somewhat filled via left-to-right collaterals.  This is felt to be CTO with plan to treat medically.  LVEF 35 to 40%.  Moderately elevated LVEDP. LHC 08/20/2020 (unstable angina): Stable three-vessel CAD.  Patent stents proximal to mid LAD.  Patent OM2 stent with minimal LCx disease.  No mid RCA CTO after RV marginal branch.  Distal RCA fills via bridging collaterals right to right from RV marginal and left-to-right from distal LCx and D2.  Normal EF and LVEDP. Ischemic cardiomyopathy.  Echo 09/24/2019: EF 55 to 60%.  Grade II DD.  Hypokinesis of mid apical inferoseptal wall.  Akinesis apical anterior wall and mid anteroseptal wall.  Normal RV function.  Mild aortic valve sclerosis without stenosis. Hypertension.  Hyperlipidemia.  Lipid panel 05/23/2022: LDL 52, HDL 39, TG 60, total 103.     [name] [name] was last seen on 07/04/2022 by Morey Ar, NP-C = No changes made; NO Sx    Subjective  Discussed the use of AI scribe software for clinical note transcription with the patient, who gave verbal consent to proceed.  History of Present Illness  [name] [name] presents here today doing for most part pretty well.  He experienced a significant episode of chest pain right before Christmas while standing and talking. The pain was described as a pressure across his chest that did not resolve with water or rest. He took a nitroglycerin  pill, which relieved the pain immediately. No similar episodes have occurred since then. He has resumed walking regularly and reports no chest pain or discomfort during these activities. Occasionally, he experiences shortness of breath when sitting but not consistently.  However, he denies any PND or orthopnea symptoms.  No rapid irregular heartbeats or palpitations.  No syncope or near syncope.  No TIA or emesis fugax.  No claudication. No blood in stools, tarry stools, blood in urine, epistaxis.  His blood pressure has been low, with readings around the 90s, but he does not feel dizzy or woozy. He takes losartan  in the morning, carvedilol  3.125 mg twice a day, Lipitor  40 mg, and Zetia  10 mg. No muscle aches or cramping from his cholesterol medications.  He recently had blood work done, which showed some abnormalities he did not understand, but his cholesterol levels were reported as outstanding. He is on a 90-day refill cycle for his medications.  He inquired about the safety of using Viagra ,  which he has not used yet. He has a prescription for 100 mg but has not taken it.   Objective   Current Meds  Medication Sig   atorvastatin  (LIPITOR ) 40 MG tablet TAKE 1 TABLET BY MOUTH EVERY DAY   carvedilol  (COREG ) 3.125 MG tablet TAKE 1 TABLET BY MOUTH TWICE A DAY   ezetimibe  (ZETIA ) 10 MG tablet TAKE 1 TABLET BY MOUTH EVERY DAY   losartan  (COZAAR ) 25 MG tablet TAKE 1/2 TABLET BY MOUTH DAILY   nitroGLYCERIN  (NITROSTAT ) 0.4 MG SL tablet Place  1 tablet (0.4 mg total) under the tongue every 5 (five) minutes as needed for chest pain   sildenafil  (VIAGRA ) 100 MG tablet Take 0.5-1 tablets (50-100 mg total) by mouth daily as needed for erectile dysfunction.   ticagrelor  (BRILINTA ) 60 MG TABS tablet TAKE 1 TABLET BY MOUTH TWICE A DAY      Azelastine  HCl 137 MCG/SPRAY SOLN flunisolide  (NASALIDE ) 25 MCG/ACT (0.025%) SOLN diclofenac  Sodium (VOLTAREN ) 1 % GEL 1 spray each nostril twice daily. PLACE 2 SPRAYS INTO THE NOSE 2 (TWO) TIMES DAILY.  Apply a small grape sized dollop onto affected joint up to 4 times daily.    Studies Reviewed: Aaron Aas   EKG Interpretation Date/Time:  Wednesday Jun 28 2023 16:51:55 EDT Ventricular Rate:  55 PR Interval:  128 QRS Duration:  84 QT Interval:  430 QTC Calculation: 411 R Axis:   1  Text Interpretation: Sinus bradycardia Anteroseptal infarct (cited on or before 29-May-2019) When compared with ECG of 20-Aug-2020 10:20, No significant change was found Confirmed by Randene Bustard ([id number]) on 07/01/2023 4:45:41 PM    Lab Results  Component Value Date   CHOL 90 05/26/2023   HDL 30.80 (L) 05/26/2023   LDLCALC 45 05/26/2023   TRIG 68.0 05/26/2023   CHOLHDL 3 05/26/2023   Lab Results  Component Value Date   NA 137 05/26/2023   K 4.1 05/26/2023   CREATININE 0.86 05/26/2023   GFR 96.46 05/26/2023   GLUCOSE 96 05/26/2023   Lab Results  Component Value Date   HGBA1C 6.0 05/26/2023    ECHO: EF 55-60%. WMA: apical anterior Akinesis & mid-apical inferoseptal hypokinesis. GR 2 DD. Mild AoV Sclerosis w/o Stenosis. Normal RAP  CATH: 08/2020: Widely patent LAD & OM2 stents. mRCA CTO with L-R collaterals. Normal LVEF & EDP    Risk Assessment/Calculations:          Physical Exam:   VS:  BP 94/64   Pulse (!) 55   Ht 5\' 8"  (1.727 m)   Wt 180 lb (81.6 kg)   SpO2 98%   BMI 27.37 kg/m    Wt Readings from Last 3 Encounters:  06/28/23 180 lb (81.6 kg)  05/26/23 178 lb (80.7 kg)  02/16/23 183 lb (83 kg)     GEN: Appearing.  Well nourished, well groomed in no acute distress;  NECK: No JVD; No carotid bruits CARDIAC: Normal S1, S2; RRR, soft 1/6 SEM at RUSB but otherwise no murmurs, rubs, gallops RESPIRATORY:  Clear to auscultation without rales, wheezing or rhonchi ; nonlabored, good air movement. ABDOMEN: Soft, non-tender, non-distended EXTREMITIES:  No edema; No deformity      ASSESSMENT AND PLAN: .    Problem List Items Addressed This Visit       Cardiology Problems   CAD S/P percutaneous coronary angioplasty (Chronic)   Coronary artery disease with myocardial infarction => three-vessel disease noted at time of initial STEMI with LAD and OM PCI and occluded dRCA-PDA.  Stents  were patent by cath in July 2022.  - Continue Brilinta  60 mg twice daily maintenance dosing Okay to hold Brilinta  5 to 7 days preop for surgeries or procedures..      Coronary artery disease involving native coronary artery of native heart without angina pectoris - Primary (Chronic)   1 single episode of chest pain relieved by nitroglycerin  suggests possible angina => Differential includes esophageal spasm or heartburn.  Thankfully, no recurrent symptoms. - If chest pain recurs and is not relieved by nitroglycerin , seek medical evaluation. - Consider long-acting nitroglycerin  if recurrent symptoms occur. - Continue Lipitor  40 mg daily, and Zetia  10 mg for lipid management along with carvedilol  3.25 mg twice daily but discontinued losartan  12.5 mg daily. - Continue Brilinta  60 mg twice daily for SAPT maintenance.      Relevant Orders   EKG 12-Lead (Completed)   Hyperlipidemia with target low density lipoprotein (LDL) cholesterol less than 55 mg/dL (Chronic)   Cholesterol levels well-controlled. HDL slightly low but overall at target.  No statin-related muscle issues. - Continue lipid-lowering therapy with Lipitor   mg and Zetia  10 mg      Relevant Orders   EKG 12-Lead (Completed)   Ischemic  cardiomyopathy (Chronic)   Resolved-at the time of MI his EF was 35 to 40%.  Improved with revascularization and medical management to 55-60% by August 2021      Primary hypertension (Chronic)   Actually now having more issues with occasional hypotension. Blood pressure in the 90s without symptoms. Plan to discontinue losartan  and monitor. - Discontinue losartan  and monitor blood pressure at home. - If blood pressure rises to 120s-130s, resume losartan . - Continue carvedilol  3.125 mg twice daily as prescribed.      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  RPDA was 100% of the time of the radial STEMI. Initially his EF was reduced, but following PCI improved from 35 to 40% up to 50 to 55%.          Follow-Up: Return in about 1 year (around 06/27/2024) for Routine follow up with me or APP, [employer].     Signed, Arleen Lacer, MD, MS Randene Bustard, M.D., M.S. Interventional Chartered certified accountant  Pager # [phone]

## 2023-07-01 ENCOUNTER — Encounter: Payer: Self-pay | Admitting: Cardiology

## 2023-07-01 DIAGNOSIS — I2510 Atherosclerotic heart disease of native coronary artery without angina pectoris: Secondary | ICD-10-CM | POA: Insufficient documentation

## 2023-07-01 NOTE — Assessment & Plan Note (Signed)
 Actually now having more issues with occasional hypotension. Blood pressure in the 90s without symptoms. Plan to discontinue losartan  and monitor. - Discontinue losartan  and monitor blood pressure at home. - If blood pressure rises to 120s-130s, resume losartan . - Continue carvedilol  3.125 mg twice daily as prescribed.

## 2023-07-01 NOTE — Assessment & Plan Note (Signed)
 Anterior STEMI back in April 2021 with repeat cath in 2022 showing patent stents in the LAD and OM1.  RPDA was 100% of the time of the radial STEMI. Initially his EF was reduced, but following PCI improved from 35 to 40% up to 50 to 55%.

## 2023-07-01 NOTE — Assessment & Plan Note (Addendum)
 Coronary artery disease with myocardial infarction => three-vessel disease noted at time of initial STEMI with LAD and OM PCI and occluded dRCA-PDA.  Stents were patent by cath in July 2022.  - Continue Brilinta  60 mg twice daily maintenance dosing Okay to hold Brilinta  5 to 7 days preop for surgeries or procedures.Aaron Aas

## 2023-07-01 NOTE — Assessment & Plan Note (Addendum)
 1 single episode of chest pain relieved by nitroglycerin  suggests possible angina => Differential includes esophageal spasm or heartburn.  Thankfully, no recurrent symptoms. - If chest pain recurs and is not relieved by nitroglycerin , seek medical evaluation. - Consider long-acting nitroglycerin  if recurrent symptoms occur. - Continue Lipitor  40 mg daily, and Zetia  10 mg for lipid management along with carvedilol  3.25 mg twice daily but discontinued losartan  12.5 mg daily. - Continue Brilinta  60 mg twice daily for SAPT maintenance.

## 2023-07-01 NOTE — Assessment & Plan Note (Signed)
 Resolved-at the time of MI his EF was 35 to 40%.  Improved with revascularization and medical management to 55-60% by August 2021

## 2023-07-01 NOTE — Assessment & Plan Note (Signed)
 Cholesterol levels well-controlled. HDL slightly low but overall at target.  No statin-related muscle issues. - Continue lipid-lowering therapy with Lipitor   mg and Zetia  10 mg

## 2023-08-21 ENCOUNTER — Observation Stay (HOSPITAL_COMMUNITY)
Admission: EM | Admit: 2023-08-21 | Discharge: 2023-08-24 | Disposition: A | Discharge status: Home / Self Care | Attending: Internal Medicine | Admitting: Internal Medicine

## 2023-08-21 ENCOUNTER — Emergency Department (HOSPITAL_COMMUNITY)

## 2023-08-21 DIAGNOSIS — K8020 Calculus of gallbladder without cholecystitis without obstruction: Secondary | ICD-10-CM | POA: Diagnosis not present

## 2023-08-21 DIAGNOSIS — Z955 Presence of coronary angioplasty implant and graft: Secondary | ICD-10-CM | POA: Diagnosis not present

## 2023-08-21 DIAGNOSIS — I2510 Atherosclerotic heart disease of native coronary artery without angina pectoris: Secondary | ICD-10-CM | POA: Insufficient documentation

## 2023-08-21 DIAGNOSIS — K859 Acute pancreatitis without necrosis or infection, unspecified: Secondary | ICD-10-CM | POA: Diagnosis not present

## 2023-08-21 DIAGNOSIS — R001 Bradycardia, unspecified: Secondary | ICD-10-CM | POA: Diagnosis not present

## 2023-08-21 DIAGNOSIS — K8000 Calculus of gallbladder with acute cholecystitis without obstruction: Secondary | ICD-10-CM | POA: Diagnosis not present

## 2023-08-21 DIAGNOSIS — K828 Other specified diseases of gallbladder: Secondary | ICD-10-CM | POA: Diagnosis not present

## 2023-08-21 DIAGNOSIS — Z7982 Long term (current) use of aspirin: Secondary | ICD-10-CM | POA: Insufficient documentation

## 2023-08-21 DIAGNOSIS — R0789 Other chest pain: Secondary | ICD-10-CM | POA: Diagnosis not present

## 2023-08-21 DIAGNOSIS — K8050 Calculus of bile duct without cholangitis or cholecystitis without obstruction: Secondary | ICD-10-CM | POA: Diagnosis present

## 2023-08-21 DIAGNOSIS — R079 Chest pain, unspecified: Secondary | ICD-10-CM | POA: Diagnosis not present

## 2023-08-21 DIAGNOSIS — K810 Acute cholecystitis: Secondary | ICD-10-CM | POA: Diagnosis not present

## 2023-08-21 DIAGNOSIS — K8012 Calculus of gallbladder with acute and chronic cholecystitis without obstruction: Secondary | ICD-10-CM | POA: Diagnosis not present

## 2023-08-21 DIAGNOSIS — R1013 Epigastric pain: Secondary | ICD-10-CM | POA: Diagnosis not present

## 2023-08-21 DIAGNOSIS — R109 Unspecified abdominal pain: Secondary | ICD-10-CM | POA: Diagnosis not present

## 2023-08-21 DIAGNOSIS — Z79899 Other long term (current) drug therapy: Secondary | ICD-10-CM | POA: Diagnosis not present

## 2023-08-21 LAB — CBC
HCT: 41.8 % (ref 39.0–52.0)
Hemoglobin: 14.4 g/dL (ref 13.0–17.0)
MCH: 31 pg (ref 26.0–34.0)
MCHC: 34.4 g/dL (ref 30.0–36.0)
MCV: 90.1 fL (ref 80.0–100.0)
Platelets: 269 K/uL (ref 150–400)
RBC: 4.64 MIL/uL (ref 4.22–5.81)
RDW: 11.9 % (ref 11.5–15.5)
WBC: 8 K/uL (ref 4.0–10.5)
nRBC: 0 % (ref 0.0–0.2)

## 2023-08-21 LAB — HEPATIC FUNCTION PANEL
ALT: 35 U/L (ref 0–44)
AST: 31 U/L (ref 15–41)
Albumin: 3.8 g/dL (ref 3.5–5.0)
Alkaline Phosphatase: 64 U/L (ref 38–126)
Bilirubin, Direct: 0.2 mg/dL (ref 0.0–0.2)
Indirect Bilirubin: 0.8 mg/dL (ref 0.3–0.9)
Total Bilirubin: 1 mg/dL (ref 0.0–1.2)
Total Protein: 6.5 g/dL (ref 6.5–8.1)

## 2023-08-21 LAB — BASIC METABOLIC PANEL WITH GFR
Anion gap: 10 (ref 5–15)
BUN: 10 mg/dL (ref 6–20)
CO2: 23 mmol/L (ref 22–32)
Calcium: 9.1 mg/dL (ref 8.9–10.3)
Chloride: 104 mmol/L (ref 98–111)
Creatinine, Ser: 1.09 mg/dL (ref 0.61–1.24)
GFR, Estimated: >60 mL/min (ref >60)
Glucose, Bld: 171 mg/dL — ABNORMAL HIGH (ref 70–99)
Potassium: 3.9 mmol/L (ref 3.5–5.1)
Sodium: 137 mmol/L (ref 135–145)

## 2023-08-21 LAB — TROPONIN I (HIGH SENSITIVITY): Troponin I (High Sensitivity): 5 ng/L (ref <18)

## 2023-08-21 LAB — LIPASE, BLOOD: Lipase: 168 U/L — ABNORMAL HIGH (ref 11–51)

## 2023-08-21 NOTE — ED Provider Triage Note (Signed)
 Emergency Medicine Provider Triage Evaluation Note  [name] [name] , a 58 y.o. male  was evaluated in triage.  Pt complains of substernal chest pain/epigastric pain that started earlier while having a bowel movement.  Reports it was very severe, 7/10, not associated with any nausea, vomiting, resolved spontaneously but did have 3 nitroglycerin  and 4 baby aspirin  and route.  Chest pain-free at time of my evaluation.  Review of Systems  Positive: Chest pain, abdominal pain Negative:   Physical Exam  BP 108/72   Pulse 75   Temp 98.1 F (36.7 C) (Oral)   Resp 18   SpO2 96%  Gen:   Awake, no distress   Resp:  Normal effort  MSK:   Moves extremities without difficulty  Other:    Medical Decision Making  Medically screening exam initiated at 9:26 PM.  Appropriate orders placed.  [name] [name] was informed that the remainder of the evaluation will be completed by another provider, this initial triage assessment does not replace that evaluation, and the importance of remaining in the ED until their evaluation is complete.  Workup initiated in triage    [name] [name] 08/21/23 [id number]

## 2023-08-21 NOTE — ED Triage Notes (Signed)
 Brought in via EMS, chest pain, hx STEMI 4 yra ago Got 3 nitroglycerin  and 4 baby aspirin 

## 2023-08-22 ENCOUNTER — Emergency Department (HOSPITAL_COMMUNITY)

## 2023-08-22 ENCOUNTER — Inpatient Hospital Stay (HOSPITAL_COMMUNITY)

## 2023-08-22 ENCOUNTER — Encounter (HOSPITAL_COMMUNITY): Payer: Self-pay | Admitting: Emergency Medicine

## 2023-08-22 ENCOUNTER — Other Ambulatory Visit: Payer: Self-pay

## 2023-08-22 ENCOUNTER — Other Ambulatory Visit: Payer: Self-pay | Admitting: Cardiology

## 2023-08-22 DIAGNOSIS — R1011 Right upper quadrant pain: Secondary | ICD-10-CM | POA: Diagnosis not present

## 2023-08-22 DIAGNOSIS — I2510 Atherosclerotic heart disease of native coronary artery without angina pectoris: Secondary | ICD-10-CM

## 2023-08-22 DIAGNOSIS — Z9861 Coronary angioplasty status: Secondary | ICD-10-CM | POA: Diagnosis not present

## 2023-08-22 DIAGNOSIS — K8020 Calculus of gallbladder without cholecystitis without obstruction: Secondary | ICD-10-CM | POA: Diagnosis not present

## 2023-08-22 DIAGNOSIS — K8050 Calculus of bile duct without cholangitis or cholecystitis without obstruction: Secondary | ICD-10-CM

## 2023-08-22 DIAGNOSIS — K828 Other specified diseases of gallbladder: Secondary | ICD-10-CM | POA: Diagnosis not present

## 2023-08-22 DIAGNOSIS — K859 Acute pancreatitis without necrosis or infection, unspecified: Secondary | ICD-10-CM | POA: Diagnosis not present

## 2023-08-22 DIAGNOSIS — K819 Cholecystitis, unspecified: Secondary | ICD-10-CM | POA: Diagnosis not present

## 2023-08-22 DIAGNOSIS — R1013 Epigastric pain: Secondary | ICD-10-CM | POA: Diagnosis not present

## 2023-08-22 DIAGNOSIS — R109 Unspecified abdominal pain: Secondary | ICD-10-CM | POA: Diagnosis not present

## 2023-08-22 LAB — TROPONIN I (HIGH SENSITIVITY): Troponin I (High Sensitivity): 4 ng/L (ref <18)

## 2023-08-22 MED ORDER — LACTATED RINGERS IV BOLUS
1000.0000 mL | Freq: Once | INTRAVENOUS | Status: AC
  Administered 2023-08-22: 1000 mL via INTRAVENOUS

## 2023-08-22 MED ORDER — ALUM & MAG HYDROXIDE-SIMETH 200-200-20 MG/5ML PO SUSP
30.0000 mL | Freq: Once | ORAL | Status: AC
  Administered 2023-08-22: 30 mL via ORAL
  Filled 2023-08-22: qty 30

## 2023-08-22 MED ORDER — IOHEXOL 350 MG/ML SOLN
75.0000 mL | Freq: Once | INTRAVENOUS | Status: AC | PRN
  Administered 2023-08-22: 75 mL via INTRAVENOUS

## 2023-08-22 MED ORDER — ACETAMINOPHEN 160 MG/5ML PO SOLN
650.0000 mg | Freq: Four times a day (QID) | ORAL | Status: DC | PRN
  Administered 2023-08-23: 650 mg via ORAL
  Filled 2023-08-22: qty 20.3

## 2023-08-22 MED ORDER — TECHNETIUM TC 99M MEBROFENIN IV KIT
5.0700 | PACK | Freq: Once | INTRAVENOUS | Status: AC | PRN
  Administered 2023-08-22: 5.07 via INTRAVENOUS

## 2023-08-22 MED ORDER — HYDROMORPHONE HCL 1 MG/ML IJ SOLN
0.5000 mg | INTRAMUSCULAR | Status: DC | PRN
  Administered 2023-08-24: 0.5 mg via INTRAVENOUS
  Filled 2023-08-22: qty 0.5

## 2023-08-22 MED ORDER — TRAMADOL HCL 50 MG PO TABS: 50.0000 mg | ORAL_TABLET | Freq: Four times a day (QID) | ORAL | Status: DC | PRN

## 2023-08-22 MED ORDER — CARVEDILOL 6.25 MG PO TABS
3.1250 mg | ORAL_TABLET | Freq: Two times a day (BID) | ORAL | Status: DC
  Administered 2023-08-22 – 2023-08-24 (×2): 3.125 mg via ORAL
  Filled 2023-08-22 (×3): qty 1

## 2023-08-22 MED ORDER — PANTOPRAZOLE SODIUM 40 MG IV SOLR
40.0000 mg | INTRAVENOUS | Status: DC
  Administered 2023-08-22 – 2023-08-23 (×2): 40 mg via INTRAVENOUS
  Filled 2023-08-22 (×2): qty 10

## 2023-08-22 MED ORDER — HYDROMORPHONE HCL 1 MG/ML IJ SOLN
0.5000 mg | Freq: Once | INTRAMUSCULAR | Status: AC
  Administered 2023-08-22: 0.5 mg via INTRAVENOUS
  Filled 2023-08-22: qty 1

## 2023-08-22 MED ORDER — SODIUM CHLORIDE 0.9 % IV SOLN: INTRAVENOUS | Status: AC

## 2023-08-22 MED ORDER — PANTOPRAZOLE SODIUM 40 MG IV SOLR
40.0000 mg | Freq: Once | INTRAVENOUS | Status: AC
  Administered 2023-08-22: 40 mg via INTRAVENOUS
  Filled 2023-08-22: qty 10

## 2023-08-22 MED ORDER — METRONIDAZOLE 500 MG/100ML IV SOLN
500.0000 mg | Freq: Two times a day (BID) | INTRAVENOUS | Status: DC
  Administered 2023-08-22 – 2023-08-24 (×5): 500 mg via INTRAVENOUS
  Filled 2023-08-22 (×5): qty 100

## 2023-08-22 MED ORDER — SODIUM CHLORIDE 0.9 % IV SOLN
2.0000 g | INTRAVENOUS | Status: DC
  Administered 2023-08-22 – 2023-08-24 (×3): 2 g via INTRAVENOUS
  Filled 2023-08-22 (×3): qty 20

## 2023-08-22 MED ORDER — ONDANSETRON HCL 4 MG/2ML IJ SOLN: 4.0000 mg | Freq: Four times a day (QID) | INTRAMUSCULAR | Status: DC | PRN

## 2023-08-22 MED ORDER — OXYCODONE HCL 5 MG PO TABS: 5.0000 mg | ORAL_TABLET | ORAL | Status: DC | PRN

## 2023-08-22 NOTE — H&P (Signed)
 [facility] History and Physical  [name] [name] FMW:[id number] DOB: 1965-12-10 DOA: 08/21/2023   PCP: [name] Elsie Sayre, MD  Specialists: Cardiology  Chief Complaint: Abdominal pain  HPI: [name] [name] is a 57 y.o. male with a past medical history of coronary artery disease status post MI in 2021 status post PCI who was in his usual state of health until about 7 PM last night when he started developing abdominal pain.  This was located over the upper abdomen without any radiation.  It was 10 out of 10 in intensity.  Developed some nausea but no vomiting.  Denied any chest pain or shortness of breath.  Has never had similar pain previously.  Currently his pain is about 5 out of 10 in intensity.  No dizziness or lightheadedness.  In the emergency department he was noted to be hemodynamically stable.  He was afebrile.  Blood work was unremarkable.  Lipase level was noted to be elevated at 168.  Troponin levels were normal.  CT of the abdomen pelvis showed stone in the gallbladder neck.  Concern was for gallbladder etiology.  He was hospitalized for further management.  Home Medications: Prior to Admission medications   Medication Sig Start Date End Date Taking? Authorizing Provider  atorvastatin  (LIPITOR ) 40 MG tablet TAKE 1 TABLET BY MOUTH EVERY DAY 08/17/22   [name] Alm ORN, MD  Azelastine  HCl 137 MCG/SPRAY SOLN 1 spray each nostril twice daily. 05/26/23   [name] Elsie Sayre, MD  carvedilol  (COREG ) 3.125 MG tablet TAKE 1 TABLET BY MOUTH TWICE A DAY 02/20/23   [name] Sober, NP  diclofenac  Sodium (VOLTAREN ) 1 % GEL Apply a small grape sized dollop onto affected joint up to 4 times daily. 11/16/21   [name] Elsie Sayre, MD  ezetimibe  (ZETIA ) 10 MG tablet TAKE 1 TABLET BY MOUTH EVERY DAY 09/13/22   [name] Alm ORN, MD  flunisolide  (NASALIDE ) 25 MCG/ACT (0.025%) SOLN PLACE 2 SPRAYS INTO THE NOSE 2 (TWO) TIMES DAILY. 01/21/22   [name] Elsie Sayre, MD  nitroGLYCERIN   (NITROSTAT ) 0.4 MG SL tablet Place 1 tablet (0.4 mg total) under the tongue every 5 (five) minutes as needed for chest pain (up to 3 doses. If taking 3rd dose, call 911.). 07/04/22   [name] Sober, NP  sildenafil  (VIAGRA ) 100 MG tablet Take 0.5-1 tablets (50-100 mg total) by mouth daily as needed for erectile dysfunction. 05/23/22   [name] Elsie Sayre, MD  ticagrelor  (BRILINTA ) 60 MG TABS tablet TAKE 1 TABLET BY MOUTH TWICE A DAY 12/06/22   [name] Alm ORN, MD    Allergies: No Known Allergies  Past Medical History: Past Medical History:  Diagnosis Date   History of acute anterior wall MI 05/29/2019   100% LAD after SP1 -> (long lesion); 90% prox OM 2 (mod LPL 60%), ~100% CTO of RPDA (high bifurcation, fills via L-R collateral flow) - 3 overlapping DES in LAD & 1 in OM2   Hyperlipidemia LDL goal <70    Multiple vessel coronary artery disease 05/29/2019   Cath 05/29/2019 : Anterior STEMI -> Severe Three-Vessel Disease:  CULPRIT LESION 100% LAD after SP1 (long); 90% pOM 2 (with mod LPL), ~100 CTO of RPDA (high bifurcation, L-R collateral); Successful DES PCI p-mLAD w/ 3 overlapping Resolute Onyx DES (3.0 x 12, 2.5 x 38, & 2.25 x 15 --> 3.65-2.8 mm), OM2 Resolute Onyx DES 2 x 12 - 2.2 mm.     Past Surgical History:  Procedure Laterality Date   CORONARY STENT INTERVENTION N/A 05/29/2019  Procedure: CORONARY STENT INTERVENTION;  Surgeon: [name] Alm ORN, MD;  Location: [facility] INVASIVE CV LAB;  Service: Cardiovascular;; 2nd LESION: OM 2 with Resolute Onyx DES 2.0 mm x 12 mm--postdilated to 2.2 mm)   CORONARY/GRAFT ACUTE MI REVASCULARIZATION N/A 05/29/2019   Procedure: CORONARY/GRAFT ACUTE MI REVASCULARIZATION;  Surgeon: [name] Alm ORN, MD;  Location: [facility] INVASIVE CV LAB;  Service: Cardiovascular;  Successful DES PCI of extensive proximal to mid LAD using 3 overlapping Resolute Onyx DES stents (3.0 mm x 12 mm, 2.5 mm x 38 mm, and 2.25 mm x 15 mm -> entire segment postdilated from 3.65 mm down to  2.8 mm)    LEFT HEART CATH AND CORONARY ANGIOGRAPHY N/A 05/29/2019   Procedure: LEFT HEART CATH AND CORONARY ANGIOGRAPHY;  Surgeon: [name] Alm ORN, MD;  Location: [facility] INVASIVE CV LAB;  Service: Cardiovascular;; CULPRIT LESION 100% LAD after SP1 (long lesion); 90% proximal OM 2 (with moderate LPL), 100% CTO of RPDA (high bifurcation, fills via L-R collaterals; EF ~35-40% mid-apical Hypo-AK. LVEDP 22 mmHg.   LEFT HEART CATH AND CORONARY ANGIOGRAPHY N/A 08/20/2020   Procedure: LEFT HEART CATH AND CORONARY ANGIOGRAPHY;  Surgeon: [name] Alm ORN, MD;  Location: [facility] INVASIVE CV LAB;  Service: Cardiovascular; widely patent overlapping stents in the LAD with 95% ISR.  Widely patent LAD stent followed by 30% stenosis.  RPDA 100% occluded after high bifurcation with bridging, or-R and L and R collaterals.  EF 50 to 55%.  Normal EDP.   TRANSTHORACIC ECHOCARDIOGRAM  09/2019   EF ~ 55-60%, Mid-apical Inferoseptal HK & AK of apical and anteroseptal wall.  Gr II DD.  Normal RV size and function.  Mild aortic sclerosis.   TRANSTHORACIC ECHOCARDIOGRAM  05/2019    (Anterior STEMI) --> EF 35 to 40%.  Entire anterior wall, mid and distal anterior septum and apex are akinetic.  Mild LVH, GR 1 DD.    Social History: Lives with his family.  He denies smoking or alcohol use on a consistent basis.  Independent with his daily activities.  Family History:  Family History  Problem Relation Age of Onset   Hypertension Mother      Review of Systems - History obtained from the patient General ROS: negative Psychological ROS: negative Ophthalmic ROS: negative ENT ROS: negative Allergy and Immunology ROS: negative Hematological and Lymphatic ROS: negative Endocrine ROS: negative Respiratory ROS: no cough, shortness of breath, or wheezing Cardiovascular ROS: no chest pain or dyspnea on exertion Gastrointestinal ROS: As in HPI Genito-Urinary ROS: no dysuria, trouble voiding, or hematuria Musculoskeletal ROS:  negative Neurological ROS: no TIA or stroke symptoms Dermatological ROS: negative  Physical Examination  Vitals:   08/22/23 0330 08/22/23 0530 08/22/23 0721 08/22/23 0930  BP: 123/82 130/79 126/78 112/81  Pulse: (!) 58 (!) 51 (!) 55 (!) 59  Resp: 13 12 20    Temp:   98 F (36.7 C) 97.8 F (36.6 C)  TempSrc:   Oral   SpO2: 100% 100% 100% 97%    BP 112/81   Pulse (!) 59   Temp 97.8 F (36.6 C)   Resp 20   SpO2 97%   General appearance: alert, cooperative, appears stated age, and no distress Head: Normocephalic, without obvious abnormality, atraumatic Eyes: conjunctivae/corneas clear. PERRL, EOM's intact.  Throat: lips, mucosa, and tongue normal; teeth and gums normal Neck: no adenopathy, no carotid bruit, no JVD, supple, symmetrical, trachea midline, and thyroid not enlarged, symmetric, no tenderness/mass/nodules Resp: clear to auscultation bilaterally Cardio: regular rate and rhythm, S1,  S2 normal, no murmur, click, rub or gallop GI: soft, non-tender; bowel sounds normal; no masses,  no organomegaly Extremities: extremities normal, atraumatic, no cyanosis or edema Pulses: 2+ and symmetric Skin: Skin color, texture, turgor normal. No rashes or lesions Lymph nodes: Cervical, supraclavicular, and axillary nodes normal. Neurologic: Alert and oriented x 3.  No obvious focal neurological deficits noted.   Labs on Admission: I have personally reviewed following labs and imaging studies  CBC: Recent Labs  Lab 08/21/23 2054  WBC 8.0  HGB 14.4  HCT 41.8  MCV 90.1  PLT 269   Basic Metabolic Panel: Recent Labs  Lab 08/21/23 2054  NA 137  K 3.9  CL 104  CO2 23  GLUCOSE 171*  BUN 10  CREATININE 1.09  CALCIUM  9.1   GFR: CrCl cannot be calculated (Unknown ideal weight.). Liver Function Tests: Recent Labs  Lab 08/21/23 2054  AST 31  ALT 35  ALKPHOS 64  BILITOT 1.0  PROT 6.5  ALBUMIN 3.8   Recent Labs  Lab 08/21/23 2054  LIPASE 168*     Radiological  Exams on Admission: US  Abdomen Limited RUQ (LIVER/GB) Result Date: 08/22/2023 EXAM: Right Upper Quadrant Abdominal Ultrasound TECHNIQUE: Real-time ultrasonography of the right upper quadrant of the abdomen was performed. COMPARISON: None. CLINICAL HISTORY: 07368 Cholecystitis. Abnormal CT scan. FINDINGS: LIVER: The liver demonstrates normal echogenicity. No intrahepatic biliary ductal dilatation. No mass. BILIARY SYSTEM: Gallbladder wall measurement is 2.4 mm, within normal limits. No gallstones are visualized. No sonographic Murphy's sign is reported. Common bile duct is within normal limits measuring 3.7 mm. RIGHT KIDNEY: The right kidney is grossly unremarkable in appearances without evidence of hydronephrosis, echogenic calculate or worrisome mass lesions. PANCREAS: Visualized portions of the pancreas are unremarkable. OTHER: No right upper quadrant ascites. IMPRESSION: 1. No sonographic evidence of cholecystitis. Electronically signed by: Lonni Necessary MD 08/22/2023 06:20 AM EDT RP Workstation: [id number]   CT ABDOMEN PELVIS W CONTRAST Result Date: 08/22/2023 CLINICAL DATA:  Pancreatitis. Substernal chest pain and epigastric pain. EXAM: CT ABDOMEN AND PELVIS WITH CONTRAST TECHNIQUE: Multidetector CT imaging of the abdomen and pelvis was performed using the standard protocol following bolus administration of intravenous contrast. RADIATION DOSE REDUCTION: This exam was performed according to the departmental dose-optimization program which includes automated exposure control, adjustment of the mA and/or kV according to patient size and/or use of iterative reconstruction technique. CONTRAST:  75mL OMNIPAQUE  IOHEXOL  350 MG/ML SOLN COMPARISON:  None Available. FINDINGS: Lower chest: Lung bases are clear. Hepatobiliary: Cholelithiasis with stone in the gallbladder neck. Mild pericholecystic edema. No focal liver lesions. No bile duct dilatation. Pancreas: Unremarkable. No pancreatic ductal dilatation or  surrounding inflammatory changes. Spleen: Normal in size without focal abnormality. Adrenals/Urinary Tract: Adrenal glands are unremarkable. Kidneys are normal, without renal calculi, focal lesion, or hydronephrosis. Bladder is unremarkable. Stomach/Bowel: Stomach is within normal limits. Appendix appears normal. No evidence of bowel wall thickening, distention, or inflammatory changes. Vascular/Lymphatic: No significant vascular findings are present. No enlarged abdominal or pelvic lymph nodes. Reproductive: Prostate is unremarkable. Other: No abdominal wall hernia or abnormality. No abdominopelvic ascites. Musculoskeletal: No acute or significant osseous findings. IMPRESSION: 1. Cholelithiasis with gallstone in the gallbladder neck. Mild gallbladder wall thickening with pericholecystic edema. Changes may indicate acute cholecystitis in the appropriate clinical setting. 2. Pancreas is unremarkable. No inflammatory changes or collections identified. 3. No evidence of bowel obstruction or inflammation. Electronically Signed   By: Elsie Gravely M.D.   On: 08/22/2023 03:50   DG Chest 2  View Result Date: 08/21/2023 CLINICAL DATA:  Chest pain EXAM: CHEST - 2 VIEW COMPARISON:  08/19/2020 FINDINGS: The heart size and mediastinal contours are within normal limits. Both lungs are clear. The visualized skeletal structures are unremarkable. IMPRESSION: No active cardiopulmonary disease. Electronically Signed   By: Franky Crease M.D.   On: 08/21/2023 21:21    My interpretation of Electrocardiogram: Sinus bradycardia at 58 bpm.  Normal axis.  Intervals appear to be normal.  No concerning ST or T wave changes noted.   Problem List  Principal Problem:   Biliary colic Active Problems:   CAD S/P percutaneous coronary angioplasty   Assessment: This is a 58 year old Caucasian male with past medical history as stated earlier comes in with abdominal pain.  Found to have elevated lipase level without any evidence for  pancreatitis on CT scan.  LFTs were normal but CT scan suggested stone in the gallbladder neck.  Hospitalized for further management.  Plan:  Abdominal pain with concern for gallbladder etiology: Seen by general surgery.  Right upper quadrant ultrasound does not suggest cholecystitis.  Lipase was noted to be elevated.  This will be rechecked in the morning.  CT scan however does not show any evidence for pancreatitis.  Continue with ceftriaxone  and metronidazole .  Continue with PPI.  Coronary artery disease: He status post MI in 2021.  Multiple stents were placed at that time.  Cardiac cath was done in 22 which showed stable findings.  Cardiac status is stable currently.  He was on Brilinta  which is currently on hold in case he needs gallbladder surgery.  Continue with beta-blocker.   DVT Prophylaxis: SCDs for now Code Status: Full code Family Communication: Discussed with patient Disposition: Hopefully home when improved Consults called: General surgery Admission Status: Status is: Inpatient Remains inpatient appropriate because: Abdominal pain with concern for gallbladder etiology which may require surgical intervention    Severity of Illness: The appropriate patient status for this patient is INPATIENT. Inpatient status is judged to be reasonable and necessary in order to provide the required intensity of service to ensure the patient's safety. The patient's presenting symptoms, physical exam findings, and initial radiographic and laboratory data in the context of their chronic comorbidities is felt to place them at high risk for further clinical deterioration. Furthermore, it is not anticipated that the patient will be medically stable for discharge from the hospital within 2 midnights of admission.   * I certify that at the point of admission it is my clinical judgment that the patient will require inpatient hospital care spanning beyond 2 midnights from the point of admission due to high  intensity of service, high risk for further deterioration and high frequency of surveillance required.*   Further management decisions will depend on results of further testing and patient's response to treatment.   Ayleen Mckinstry  [facility] Pager on [email or web address]  08/22/2023, 11:16 AM

## 2023-08-22 NOTE — TOC CM/SW Note (Signed)
 Transition of Care [facility]) - Inpatient Brief Assessment   Patient Details  Name: [name] [name] MRN: [id number] Date of Birth: 07/02/1965  [facility] CM/SW Contact:    Lauraine FORBES Saa, LCSW Phone Number: 08/22/2023, 11:35 AM   Clinical Narrative:  11:35 AM Per chart review, patient resides at home. Patient has a PCP and insurance. Patient does not have SNF or HH history. Patient has auto-PAP through [employer]. Patient's preferred pharmacy is [facility] [id number]. No TOC needs were identified at this time. TOC will continue to follow and be available to assist.  Transition of Care Asessment: Insurance and Status: Insurance coverage has been reviewed Patient has primary care physician: Yes Home environment has been reviewed: Private Residence Prior level of function:: N/A Prior/Current Home Services: No current home services Social Drivers of Health Review: SDOH reviewed no interventions necessary Readmission risk has been reviewed: Yes Transition of care needs: no transition of care needs at this time

## 2023-08-22 NOTE — Plan of Care (Signed)
  Problem: Education: Goal: Knowledge of General Education information will improve Description: Including pain rating scale, medication(s)/side effects and non-pharmacologic comfort measures Outcome: Progressing   Problem: Clinical Measurements: Goal: Respiratory complications will improve Outcome: Progressing   Problem: Clinical Measurements: Goal: Cardiovascular complication will be avoided Outcome: Progressing   Problem: Activity: Goal: Risk for activity intolerance will decrease Outcome: Progressing   Problem: Elimination: Goal: Will not experience complications related to bowel motility Outcome: Progressing   Problem: Elimination: Goal: Will not experience complications related to urinary retention Outcome: Progressing   Problem: Pain Managment: Goal: General experience of comfort will improve and/or be controlled Outcome: Progressing   Problem: Safety: Goal: Ability to remain free from injury will improve Outcome: Progressing   Problem: Skin Integrity: Goal: Risk for impaired skin integrity will decrease Outcome: Progressing

## 2023-08-22 NOTE — Progress Notes (Signed)
 SCD placed

## 2023-08-22 NOTE — Consult Note (Addendum)
 Surgical Evaluation Requesting provider: Dr. Lorette  Chief Complaint: epigastric pain  HPI: 58 year old man with history of coronary artery disease and MI 4 years ago, currently on Brilinta  who presents with subxiphoid/epigastric pain.  This began yesterday evening after he ate a milkshake.  The pain subsided temporarily while here in the emergency room after a dose of Dilaudid  and Maalox but subsequently has returned.  Denies associated nausea or vomiting.  Denies any right upper quadrant pain or recent change in bowel function.  He has had issues with heartburn lately but his significant other at the bedside notes that he often seems to have discomfort in the subxiphoid/epigastric region after meals.  Due to his history of coronary artery disease and STEMI, initial working diagnosis was acute coronary syndrome and so he got 3 nitroglycerin  and 4 baby aspirin  en route with EMS.  This did not resolve his symptoms.  In the emergency department his troponins are negative, chest x-ray and EKG are reassuring.  He works Barista.  No Known Allergies  Past Medical History:  Diagnosis Date   History of acute anterior wall MI 05/29/2019   100% LAD after SP1 -> (long lesion); 90% prox OM 2 (mod LPL 60%), ~100% CTO of RPDA (high bifurcation, fills via L-R collateral flow) - 3 overlapping DES in LAD & 1 in OM2   Hyperlipidemia LDL goal <70    Multiple vessel coronary artery disease 05/29/2019   Cath 05/29/2019 : Anterior STEMI -> Severe Three-Vessel Disease:  CULPRIT LESION 100% LAD after SP1 (long); 90% pOM 2 (with mod LPL), ~100 CTO of RPDA (high bifurcation, L-R collateral); Successful DES PCI p-mLAD w/ 3 overlapping Resolute Onyx DES (3.0 x 12, 2.5 x 38, & 2.25 x 15 --> 3.65-2.8 mm), OM2 Resolute Onyx DES 2 x 12 - 2.2 mm.     Past Surgical History:  Procedure Laterality Date   CORONARY STENT INTERVENTION N/A 05/29/2019   Procedure: CORONARY STENT INTERVENTION;  Surgeon: Anner Alm ORN,  MD;  Location: [facility] INVASIVE CV LAB;  Service: Cardiovascular;; 2nd LESION: OM 2 with Resolute Onyx DES 2.0 mm x 12 mm--postdilated to 2.2 mm)   CORONARY/GRAFT ACUTE MI REVASCULARIZATION N/A 05/29/2019   Procedure: CORONARY/GRAFT ACUTE MI REVASCULARIZATION;  Surgeon: Anner Alm ORN, MD;  Location: [facility] INVASIVE CV LAB;  Service: Cardiovascular;  Successful DES PCI of extensive proximal to mid LAD using 3 overlapping Resolute Onyx DES stents (3.0 mm x 12 mm, 2.5 mm x 38 mm, and 2.25 mm x 15 mm -> entire segment postdilated from 3.65 mm down to 2.8 mm)    LEFT HEART CATH AND CORONARY ANGIOGRAPHY N/A 05/29/2019   Procedure: LEFT HEART CATH AND CORONARY ANGIOGRAPHY;  Surgeon: Anner Alm ORN, MD;  Location: [facility] INVASIVE CV LAB;  Service: Cardiovascular;; CULPRIT LESION 100% LAD after SP1 (long lesion); 90% proximal OM 2 (with moderate LPL), 100% CTO of RPDA (high bifurcation, fills via L-R collaterals; EF ~35-40% mid-apical Hypo-AK. LVEDP 22 mmHg.   LEFT HEART CATH AND CORONARY ANGIOGRAPHY N/A 08/20/2020   Procedure: LEFT HEART CATH AND CORONARY ANGIOGRAPHY;  Surgeon: Anner Alm ORN, MD;  Location: [facility] INVASIVE CV LAB;  Service: Cardiovascular; widely patent overlapping stents in the LAD with 95% ISR.  Widely patent LAD stent followed by 30% stenosis.  RPDA 100% occluded after high bifurcation with bridging, or-R and L and R collaterals.  EF 50 to 55%.  Normal EDP.   TRANSTHORACIC ECHOCARDIOGRAM  09/2019   EF ~ 55-60%, Mid-apical Inferoseptal HK &  AK of apical and anteroseptal wall.  Gr II DD.  Normal RV size and function.  Mild aortic sclerosis.   TRANSTHORACIC ECHOCARDIOGRAM  05/2019    (Anterior STEMI) --> EF 35 to 40%.  Entire anterior wall, mid and distal anterior septum and apex are akinetic.  Mild LVH, GR 1 DD.    Family History  Problem Relation Age of Onset   Hypertension Mother     Social History   Socioeconomic History   Marital status: Married    Spouse name: Not on file   Number of  children: Not on file   Years of education: 12   Highest education level: High school graduate  Occupational History   Occupation: Editor, commissioning: [employer]  Tobacco Use   Smoking status: Never   Smokeless tobacco: Never  Vaping Use   Vaping status: Never Used  Substance and Sexual Activity   Alcohol use: Never   Drug use: Never   Sexual activity: Yes  Other Topics Concern   Not on file  Social History Narrative   Lives with his wife in [place].      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   Social Drivers of Corporate investment banker Strain: Not on file  Food Insecurity: Not on file  Transportation Needs: Not on file  Physical Activity: Not on file  Stress: Not on file  Social Connections: Not on file    No current facility-administered medications on file prior to encounter.   Current Outpatient Medications on File Prior to Encounter  Medication Sig Dispense Refill   atorvastatin  (LIPITOR ) 40 MG tablet TAKE 1 TABLET BY MOUTH EVERY DAY 90 tablet 3   Azelastine  HCl 137 MCG/SPRAY SOLN 1 spray each nostril twice daily. 30 mL 3   carvedilol  (COREG ) 3.125 MG tablet TAKE 1 TABLET BY MOUTH TWICE A DAY 180 tablet 3   diclofenac  Sodium (VOLTAREN ) 1 % GEL Apply a small grape sized dollop onto affected joint up to 4 times daily. 150 g 1   ezetimibe  (ZETIA ) 10 MG tablet TAKE 1 TABLET BY MOUTH EVERY DAY 90 tablet 3   flunisolide  (NASALIDE ) 25 MCG/ACT (0.025%) SOLN PLACE 2 SPRAYS INTO THE NOSE 2 (TWO) TIMES DAILY. 75 mL 1   nitroGLYCERIN  (NITROSTAT ) 0.4 MG SL tablet Place 1 tablet (0.4 mg total) under the tongue every 5 (five) minutes as needed for chest pain (up to 3 doses. If taking 3rd dose, call 911.). 25 tablet 3   sildenafil  (VIAGRA ) 100 MG tablet Take 0.5-1 tablets (50-100 mg total) by mouth daily as needed for erectile dysfunction. 5 tablet 11   ticagrelor  (BRILINTA ) 60 MG TABS tablet TAKE 1 TABLET BY  MOUTH TWICE A DAY 180 tablet 2    Review of Systems: a complete, 10pt review of systems was completed with pertinent positives and negatives as documented in the HPI  Physical Exam: Vitals:   08/21/23 2110 08/22/23 0259  BP: 108/72 (!) 144/90  Pulse: 75 (!) 54  Resp: 18 17  Temp: 98.1 F (36.7 C) 98 F (36.7 C)  SpO2: 96% 100%   Gen: A&Ox3, no distress  Chest: respiratory effort is normal.  Cardiovascular: RRR  Gastrointestinal: soft, nondistended, minimally subjectively tender in the subxiphoid/epigastric region.  No right upper quadrant tenderness.  No peritoneal signs. Neuro: No gross deficit Psych: appropriate mood and affect, normal insight/judgment intact  Skin: warm and dry  Latest Ref Rng & Units 08/21/2023    8:54 PM 05/26/2023   11:00 AM 05/23/2022    8:56 AM  CBC  WBC 4.0 - 10.5 K/uL 8.0  5.9  8.7   Hemoglobin 13.0 - 17.0 g/dL 85.5  84.7  83.5   Hematocrit 39.0 - 52.0 % 41.8  44.1  48.0   Platelets 150 - 400 K/uL 269  239.0  276.0        Latest Ref Rng & Units 08/21/2023    8:54 PM 05/26/2023   11:00 AM 05/23/2022    8:56 AM  CMP  Glucose 70 - 99 mg/dL 828  96  897   BUN 6 - 20 mg/dL 10  10  14    Creatinine 0.61 - 1.24 mg/dL 8.90  9.13  9.04   Sodium 135 - 145 mmol/L 137  137  141   Potassium 3.5 - 5.1 mmol/L 3.9  4.1  4.5   Chloride 98 - 111 mmol/L 104  101  105   CO2 22 - 32 mmol/L 23  29  25    Calcium  8.9 - 10.3 mg/dL 9.1  9.2  89.9   Total Protein 6.5 - 8.1 g/dL 6.5  6.7  6.9   Total Bilirubin 0.0 - 1.2 mg/dL 1.0  1.0  0.8   Alkaline Phos 38 - 126 U/L 64  85  87   AST 15 - 41 U/L 31  18  17    ALT 0 - 44 U/L 35  22  27     Lab Results  Component Value Date   INR 1.1 05/29/2019    Imaging: CT ABDOMEN PELVIS W CONTRAST Result Date: 08/22/2023 CLINICAL DATA:  Pancreatitis. Substernal chest pain and epigastric pain. EXAM: CT ABDOMEN AND PELVIS WITH CONTRAST TECHNIQUE: Multidetector CT imaging of the abdomen and pelvis was performed using the  standard protocol following bolus administration of intravenous contrast. RADIATION DOSE REDUCTION: This exam was performed according to the departmental dose-optimization program which includes automated exposure control, adjustment of the mA and/or kV according to patient size and/or use of iterative reconstruction technique. CONTRAST:  75mL OMNIPAQUE  IOHEXOL  350 MG/ML SOLN COMPARISON:  None Available. FINDINGS: Lower chest: Lung bases are clear. Hepatobiliary: Cholelithiasis with stone in the gallbladder neck. Mild pericholecystic edema. No focal liver lesions. No bile duct dilatation. Pancreas: Unremarkable. No pancreatic ductal dilatation or surrounding inflammatory changes. Spleen: Normal in size without focal abnormality. Adrenals/Urinary Tract: Adrenal glands are unremarkable. Kidneys are normal, without renal calculi, focal lesion, or hydronephrosis. Bladder is unremarkable. Stomach/Bowel: Stomach is within normal limits. Appendix appears normal. No evidence of bowel wall thickening, distention, or inflammatory changes. Vascular/Lymphatic: No significant vascular findings are present. No enlarged abdominal or pelvic lymph nodes. Reproductive: Prostate is unremarkable. Other: No abdominal wall hernia or abnormality. No abdominopelvic ascites. Musculoskeletal: No acute or significant osseous findings. IMPRESSION: 1. Cholelithiasis with gallstone in the gallbladder neck. Mild gallbladder wall thickening with pericholecystic edema. Changes may indicate acute cholecystitis in the appropriate clinical setting. 2. Pancreas is unremarkable. No inflammatory changes or collections identified. 3. No evidence of bowel obstruction or inflammation. Electronically Signed   By: Elsie Gravely M.D.   On: 08/22/2023 03:50   DG Chest 2 View Result Date: 08/21/2023 CLINICAL DATA:  Chest pain EXAM: CHEST - 2 VIEW COMPARISON:  08/19/2020 FINDINGS: The heart size and mediastinal contours are within normal limits. Both lungs  are clear. The visualized skeletal structures are unremarkable. IMPRESSION: No active cardiopulmonary disease. Electronically Signed   By:  Franky Crease M.D.   On: 08/21/2023 21:21     A/P: 58 year old male with history of heart disease and prior MI status post PCI in 2021, currently on Brilinta  who presents with postprandial epigastric pain and CT findings as above concerning for acute calculus cholecystitis.  CBC/CMP unremarkable with the exception of mildly elevated lipase.  I reviewed his images personally.  Stomach appears somewhat dilated to me as does the gallbladder.  The former may just reflect timing from his previous meal.  At this point recommend admission to medical service, hold Brilinta  and will tentatively plan laparoscopic cholecystectomy in 24 to 48 hours.  Will proceed with ultrasound in the interim. Recheck labs tomorrow AM. Empiric rocephin . He reports that he can go up several flights of stairs without any chest pain or significant shortness of breath and has maintained annual follow-up with his cardiologist, is compliant with his heart care.  Will defer any further medical optimization to primary team.    I reviewed the relevant anatomy and surgery with the patient and his significant other at the bedside. Discussed risks of surgery including bleeding, infection, pain, scarring, intraabdominal injury specifically to the common bile duct and sequelae, bile leak, conversion to open surgery, subtotal cholecystectomy, blood clot, pneumonia, heart attack, stroke, failure to resolve symptoms, etc. Questions welcomed and answered to his satisfaction.     Patient Active Problem List   Diagnosis Date Noted   Coronary artery disease involving native coronary artery of native heart without angina pectoris 07/01/2023   Angina pectoris (HCC) 02/16/2023   Prediabetes 02/16/2023   Lower respiratory tract infection 12/30/2021   Post-nasal drip 12/30/2021   Need for Tdap vaccination  11/16/2021   Arthralgia 11/16/2021   Dysfunction of both eustachian tubes 11/16/2021   Family history of malignant neoplasm of gastrointestinal tract 05/13/2021   History of adenomatous polyp of colon 05/13/2021   Excessive cerumen in both ear canals 05/13/2021   Need for shingles vaccine 05/13/2021   Medication intolerance 11/13/2020   Flu vaccine need 11/13/2020   Rhus dermatitis 11/13/2020   Unstable angina (HCC) 08/19/2020   Leg fatigue 05/25/2020   Snores 05/14/2020   Inclusion cyst 05/14/2020   Right knee pain 05/14/2020   Healthcare maintenance 05/14/2020   Elevated transaminase level 09/26/2019   Leukocytosis 06/01/2019   STEMI involving left anterior descending coronary artery (HCC) 05/29/2019   Ischemic cardiomyopathy 05/29/2019   Primary hypertension 05/29/2019   Hyperlipidemia with target low density lipoprotein (LDL) cholesterol less than 55 mg/dL    CAD S/P percutaneous coronary angioplasty        Mitzie Freund, MD [facility]  See AMION to contact appropriate on-call provider

## 2023-08-22 NOTE — ED Provider Notes (Signed)
 [place] EMERGENCY DEPARTMENT AT [facility] Provider Note   CSN: [id number] Arrival date & time: 08/21/23  2050     Patient presents with: Chest Pain   [name] [name] is a 58 y.o. male.   58 year old male who presents ER today with severe epigastric abdominal pain.  Patient states this started after eating last night.  Lasted for couple hours.  He tried nitro and aspirin  as he has a history of coronary artery disease but this did not seem to help. patient pain dissipated while in the waiting room and is returned again over the last hour.  Had 1 wine cooler over the weekend but no other alcohol and does not regularly drink.  No drugs or tobacco.  Is on Brilinta  but does not take any anti-inflammatories normally.  He does have a history of indigestion and GERD.   Chest Pain      Prior to Admission medications   Medication Sig Start Date End Date Taking? Authorizing Provider  atorvastatin  (LIPITOR ) 40 MG tablet TAKE 1 TABLET BY MOUTH EVERY DAY 08/17/22   Anner Alm ORN, MD  Azelastine  HCl 137 MCG/SPRAY SOLN 1 spray each nostril twice daily. 05/26/23   Berneta Elsie Sayre, MD  carvedilol  (COREG ) 3.125 MG tablet TAKE 1 TABLET BY MOUTH TWICE A DAY 02/20/23   Loistine Sober, NP  diclofenac  Sodium (VOLTAREN ) 1 % GEL Apply a small grape sized dollop onto affected joint up to 4 times daily. 11/16/21   Berneta Elsie Sayre, MD  ezetimibe  (ZETIA ) 10 MG tablet TAKE 1 TABLET BY MOUTH EVERY DAY 09/13/22   Anner Alm ORN, MD  flunisolide  (NASALIDE ) 25 MCG/ACT (0.025%) SOLN PLACE 2 SPRAYS INTO THE NOSE 2 (TWO) TIMES DAILY. 01/21/22   Berneta Elsie Sayre, MD  nitroGLYCERIN  (NITROSTAT ) 0.4 MG SL tablet Place 1 tablet (0.4 mg total) under the tongue every 5 (five) minutes as needed for chest pain (up to 3 doses. If taking 3rd dose, call 911.). 07/04/22   Loistine Sober, NP  sildenafil  (VIAGRA ) 100 MG tablet Take 0.5-1 tablets (50-100 mg total) by mouth daily as needed for  erectile dysfunction. 05/23/22   Berneta Elsie Sayre, MD  ticagrelor  (BRILINTA ) 60 MG TABS tablet TAKE 1 TABLET BY MOUTH TWICE A DAY 12/06/22   Anner Alm ORN, MD    Allergies: Patient has no known allergies.    Review of Systems  Cardiovascular:  Positive for chest pain.    Updated Vital Signs BP 130/79   Pulse (!) 51   Temp 98 F (36.7 C) (Oral)   Resp 12   SpO2 100%   Physical Exam Vitals and nursing note reviewed.  Constitutional:      Appearance: He is well-developed.  HENT:     Head: Normocephalic and atraumatic.  Cardiovascular:     Rate and Rhythm: Normal rate.  Pulmonary:     Effort: Pulmonary effort is normal. No respiratory distress.  Abdominal:     General: There is no distension.  Musculoskeletal:        General: Normal range of motion.     Cervical back: Normal range of motion.  Neurological:     Mental Status: He is alert.     (all labs ordered are listed, but only abnormal results are displayed) Labs Reviewed  BASIC METABOLIC PANEL WITH GFR - Abnormal; Notable for the following components:      Result Value   Glucose, Bld 171 (*)    All other components within normal limits  LIPASE,  BLOOD - Abnormal; Notable for the following components:   Lipase 168 (*)    All other components within normal limits  CBC  HEPATIC FUNCTION PANEL  TROPONIN I (HIGH SENSITIVITY)  TROPONIN I (HIGH SENSITIVITY)    EKG: None  Radiology: US  Abdomen Limited RUQ (LIVER/GB) Result Date: 08/22/2023 EXAM: Right Upper Quadrant Abdominal Ultrasound TECHNIQUE: Real-time ultrasonography of the right upper quadrant of the abdomen was performed. COMPARISON: None. CLINICAL HISTORY: 07368 Cholecystitis. Abnormal CT scan. FINDINGS: LIVER: The liver demonstrates normal echogenicity. No intrahepatic biliary ductal dilatation. No mass. BILIARY SYSTEM: Gallbladder wall measurement is 2.4 mm, within normal limits. No gallstones are visualized. No sonographic Murphy's sign is reported.  Common bile duct is within normal limits measuring 3.7 mm. RIGHT KIDNEY: The right kidney is grossly unremarkable in appearances without evidence of hydronephrosis, echogenic calculate or worrisome mass lesions. PANCREAS: Visualized portions of the pancreas are unremarkable. OTHER: No right upper quadrant ascites. IMPRESSION: 1. No sonographic evidence of cholecystitis. Electronically signed by: Lonni Necessary MD 08/22/2023 06:20 AM EDT RP Workstation: [id number]   CT ABDOMEN PELVIS W CONTRAST Result Date: 08/22/2023 CLINICAL DATA:  Pancreatitis. Substernal chest pain and epigastric pain. EXAM: CT ABDOMEN AND PELVIS WITH CONTRAST TECHNIQUE: Multidetector CT imaging of the abdomen and pelvis was performed using the standard protocol following bolus administration of intravenous contrast. RADIATION DOSE REDUCTION: This exam was performed according to the departmental dose-optimization program which includes automated exposure control, adjustment of the mA and/or kV according to patient size and/or use of iterative reconstruction technique. CONTRAST:  75mL OMNIPAQUE  IOHEXOL  350 MG/ML SOLN COMPARISON:  None Available. FINDINGS: Lower chest: Lung bases are clear. Hepatobiliary: Cholelithiasis with stone in the gallbladder neck. Mild pericholecystic edema. No focal liver lesions. No bile duct dilatation. Pancreas: Unremarkable. No pancreatic ductal dilatation or surrounding inflammatory changes. Spleen: Normal in size without focal abnormality. Adrenals/Urinary Tract: Adrenal glands are unremarkable. Kidneys are normal, without renal calculi, focal lesion, or hydronephrosis. Bladder is unremarkable. Stomach/Bowel: Stomach is within normal limits. Appendix appears normal. No evidence of bowel wall thickening, distention, or inflammatory changes. Vascular/Lymphatic: No significant vascular findings are present. No enlarged abdominal or pelvic lymph nodes. Reproductive: Prostate is unremarkable. Other: No abdominal  wall hernia or abnormality. No abdominopelvic ascites. Musculoskeletal: No acute or significant osseous findings. IMPRESSION: 1. Cholelithiasis with gallstone in the gallbladder neck. Mild gallbladder wall thickening with pericholecystic edema. Changes may indicate acute cholecystitis in the appropriate clinical setting. 2. Pancreas is unremarkable. No inflammatory changes or collections identified. 3. No evidence of bowel obstruction or inflammation. Electronically Signed   By: Elsie Gravely M.D.   On: 08/22/2023 03:50   DG Chest 2 View Result Date: 08/21/2023 CLINICAL DATA:  Chest pain EXAM: CHEST - 2 VIEW COMPARISON:  08/19/2020 FINDINGS: The heart size and mediastinal contours are within normal limits. Both lungs are clear. The visualized skeletal structures are unremarkable. IMPRESSION: No active cardiopulmonary disease. Electronically Signed   By: Franky Crease M.D.   On: 08/21/2023 21:21     Procedures   Medications Ordered in the ED  acetaminophen  (TYLENOL ) 160 MG/5ML solution 650 mg (has no administration in time range)  traMADol  (ULTRAM ) tablet 50 mg (has no administration in time range)  oxyCODONE  (Oxy IR/ROXICODONE ) immediate release tablet 5 mg (has no administration in time range)  HYDROmorphone  (DILAUDID ) injection 0.5 mg (has no administration in time range)  ondansetron  (ZOFRAN ) injection 4 mg (has no administration in time range)  0.9 %  sodium chloride  infusion ( Intravenous New Bag/Given  08/22/23 0519)  pantoprazole  (PROTONIX ) injection 40 mg (has no administration in time range)  cefTRIAXone  (ROCEPHIN ) 2 g in sodium chloride  0.9 % 100 mL IVPB (0 g Intravenous Stopped 08/22/23 0630)  metroNIDAZOLE  (FLAGYL ) IVPB 500 mg (has no administration in time range)  HYDROmorphone  (DILAUDID ) injection 0.5 mg (0.5 mg Intravenous Given 08/22/23 0309)  pantoprazole  (PROTONIX ) injection 40 mg (40 mg Intravenous Given 08/22/23 0309)  alum & mag hydroxide-simeth (MAALOX/MYLANTA) 200-200-20 MG/5ML  suspension 30 mL (30 mLs Oral Given 08/22/23 0309)  lactated ringers  bolus 1,000 mL (0 mLs Intravenous Stopped 08/22/23 0435)  iohexol  (OMNIPAQUE ) 350 MG/ML injection 75 mL (75 mLs Intravenous Contrast Given 08/22/23 0340)                                    Medical Decision Making Amount and/or Complexity of Data Reviewed Labs: ordered. Radiology: ordered.  Risk OTC drugs. Prescription drug management. Decision regarding hospitalization.   CT scan consistent with cholecystitis.  His lipase is slightly elevated but his liver enzymes are fine and no evidence of biliary duct is of Tat[place] on the CT scan so I discussed with surgery.  They saw him in because he is on Brilinta  they want to let that washout prior to surgery so we will admit to medicine.  Discussed with Dr. Segars.  Ultrasound ordered.     Final diagnoses:  Calculus of gallbladder with acute cholecystitis without obstruction    ED Discharge Orders     None          Arryn Terrones, Selinda, MD 08/22/23 [phone]

## 2023-08-22 NOTE — Progress Notes (Signed)
 Patient transferred from ED at 0925 am.  Alert and oriented. On RA. Will continue to monitor through out this shift.

## 2023-08-23 DIAGNOSIS — R1013 Epigastric pain: Secondary | ICD-10-CM | POA: Diagnosis not present

## 2023-08-23 DIAGNOSIS — K8050 Calculus of bile duct without cholangitis or cholecystitis without obstruction: Secondary | ICD-10-CM | POA: Diagnosis not present

## 2023-08-23 DIAGNOSIS — K8020 Calculus of gallbladder without cholecystitis without obstruction: Secondary | ICD-10-CM | POA: Diagnosis not present

## 2023-08-23 LAB — CBC
HCT: 39.2 % (ref 39.0–52.0)
Hemoglobin: 13.5 g/dL (ref 13.0–17.0)
MCH: 31.1 pg (ref 26.0–34.0)
MCHC: 34.4 g/dL (ref 30.0–36.0)
MCV: 90.3 fL (ref 80.0–100.0)
Platelets: 209 K/uL (ref 150–400)
RBC: 4.34 MIL/uL (ref 4.22–5.81)
RDW: 11.9 % (ref 11.5–15.5)
WBC: 6.1 K/uL (ref 4.0–10.5)
nRBC: 0 % (ref 0.0–0.2)

## 2023-08-23 LAB — COMPREHENSIVE METABOLIC PANEL WITH GFR
ALT: 223 U/L — ABNORMAL HIGH (ref 0–44)
AST: 92 U/L — ABNORMAL HIGH (ref 15–41)
Albumin: 3.3 g/dL — ABNORMAL LOW (ref 3.5–5.0)
Alkaline Phosphatase: 88 U/L (ref 38–126)
Anion gap: 8 (ref 5–15)
BUN: 8 mg/dL (ref 6–20)
CO2: 24 mmol/L (ref 22–32)
Calcium: 8.5 mg/dL — ABNORMAL LOW (ref 8.9–10.3)
Chloride: 108 mmol/L (ref 98–111)
Creatinine, Ser: 1 mg/dL (ref 0.61–1.24)
GFR, Estimated: >60 mL/min (ref >60)
Glucose, Bld: 83 mg/dL (ref 70–99)
Potassium: 3.8 mmol/L (ref 3.5–5.1)
Sodium: 140 mmol/L (ref 135–145)
Total Bilirubin: 1.2 mg/dL (ref 0.0–1.2)
Total Protein: 5.7 g/dL — ABNORMAL LOW (ref 6.5–8.1)

## 2023-08-23 LAB — LIPASE, BLOOD: Lipase: 27 U/L (ref 11–51)

## 2023-08-23 LAB — TRIGLYCERIDES: Triglycerides: 44 mg/dL (ref <150)

## 2023-08-23 LAB — HIV ANTIBODY (ROUTINE TESTING W REFLEX): HIV Screen 4th Generation wRfx: NONREACTIVE

## 2023-08-23 MED ORDER — IPRATROPIUM-ALBUTEROL 0.5-2.5 (3) MG/3ML IN SOLN: 3.0000 mL | RESPIRATORY_TRACT | Status: DC | PRN

## 2023-08-23 MED ORDER — GLUCAGON HCL RDNA (DIAGNOSTIC) 1 MG IJ SOLR: 1.0000 mg | INTRAMUSCULAR | Status: DC | PRN

## 2023-08-23 MED ORDER — METOPROLOL TARTRATE 5 MG/5ML IV SOLN: 5.0000 mg | INTRAVENOUS | Status: DC | PRN

## 2023-08-23 MED ORDER — SODIUM CHLORIDE 0.9 % IV SOLN: INTRAVENOUS | Status: AC

## 2023-08-23 MED ORDER — HYDRALAZINE HCL 20 MG/ML IJ SOLN: 10.0000 mg | INTRAMUSCULAR | Status: DC | PRN

## 2023-08-23 NOTE — Progress Notes (Signed)
 Progress Note     Interval: NAEO, pain controlled  Objective: Vital signs in last 24 hours: Temp:  [97.7 F (36.5 C)-98.6 F (37 C)] 98.6 F (37 C) (07/09 0402) Pulse Rate:  [50-60] 60 (07/09 0402) Resp:  [16] 16 (07/09 0402) BP: (112-129)/(71-87) 125/86 (07/09 0402) SpO2:  [97 %-99 %] 97 % (07/09 0402) Last BM Date : 08/22/23  Intake/Output from previous day: 07/08 0701 - 07/09 0700 In: 1398.4 [I.V.:1098.7; IV Piggyback:299.7] Out: -  Intake/Output this shift: No intake/output data recorded.  PE: General: NAD Heart: regular, rate, and rhythm Lungs: Normal work of breathing on room air Abd: soft NTND MS: moves all 4 extremities Skin: warm and dry with no masses, lesions, or rashes Neuro: No focal neurologic deficits Psych: A&Ox3 with an appropriate affect.    Lab Results:  Recent Labs    08/21/23 2054 08/23/23 0517  WBC 8.0 6.1  HGB 14.4 13.5  HCT 41.8 39.2  PLT 269 209   BMET Recent Labs    08/21/23 2054 08/23/23 0517  NA 137 140  K 3.9 3.8  CL 104 108  CO2 23 24  GLUCOSE 171* 83  BUN 10 8  CREATININE 1.09 1.00  CALCIUM  9.1 8.5*   PT/INR No results for input(s): LABPROT, INR in the last 72 hours. CMP     Component Value Date/Time   NA 140 08/23/2023 0517   NA 142 12/29/2020 0810   K 3.8 08/23/2023 0517   CL 108 08/23/2023 0517   CO2 24 08/23/2023 0517   GLUCOSE 83 08/23/2023 0517   BUN 8 08/23/2023 0517   BUN 15 12/29/2020 0810   CREATININE 1.00 08/23/2023 0517   CALCIUM  8.5 (L) 08/23/2023 0517   PROT 5.7 (L) 08/23/2023 0517   PROT 6.8 12/29/2020 0810   ALBUMIN 3.3 (L) 08/23/2023 0517   ALBUMIN 4.8 12/29/2020 0810   AST 92 (H) 08/23/2023 0517   ALT 223 (H) 08/23/2023 0517   ALKPHOS 88 08/23/2023 0517   BILITOT 1.2 08/23/2023 0517   BILITOT 0.7 12/29/2020 0810   GFRNONAA >60 08/23/2023 0517   GFRAA 97 09/24/2019 0000   Lipase     Component Value Date/Time   LIPASE 27 08/23/2023 0517       Studies/Results: NM  Hepatobiliary Liver Func Result Date: 08/22/2023 CLINICAL DATA:  Right upper quadrant abdominal pain. EXAM: NUCLEAR MEDICINE HEPATOBILIARY IMAGING TECHNIQUE: Sequential images of the abdomen were obtained out to 60 minutes following intravenous administration of radiopharmaceutical. RADIOPHARMACEUTICALS:  5.07 mCi Tc-44m  Choletec  IV COMPARISON:  Studies of same day. FINDINGS: Prompt uptake and biliary excretion of activity by the liver is seen. Gallbladder activity is visualized, consistent with patency of cystic duct. Biliary activity passes into small bowel, consistent with patent common bile duct. IMPRESSION: Normal uptake within the gallbladder is noted. No definite evidence of cholecystitis. Electronically Signed   By: Lynwood Landy Raddle M.D.   On: 08/22/2023 16:30   US  Abdomen Limited RUQ (LIVER/GB) Result Date: 08/22/2023 EXAM: Right Upper Quadrant Abdominal Ultrasound TECHNIQUE: Real-time ultrasonography of the right upper quadrant of the abdomen was performed. COMPARISON: None. CLINICAL HISTORY: 07368 Cholecystitis. Abnormal CT scan. FINDINGS: LIVER: The liver demonstrates normal echogenicity. No intrahepatic biliary ductal dilatation. No mass. BILIARY SYSTEM: Gallbladder wall measurement is 2.4 mm, within normal limits. No gallstones are visualized. No sonographic Murphy's sign is reported. Common bile duct is within normal limits measuring 3.7 mm. RIGHT KIDNEY: The right kidney is grossly unremarkable in appearances without evidence of hydronephrosis,  echogenic calculate or worrisome mass lesions. PANCREAS: Visualized portions of the pancreas are unremarkable. OTHER: No right upper quadrant ascites. IMPRESSION: 1. No sonographic evidence of cholecystitis. Electronically signed by: Lonni Necessary MD 08/22/2023 06:20 AM EDT RP Workstation: [id number]   CT ABDOMEN PELVIS W CONTRAST Result Date: 08/22/2023 CLINICAL DATA:  Pancreatitis. Substernal chest pain and epigastric pain. EXAM: CT ABDOMEN AND  PELVIS WITH CONTRAST TECHNIQUE: Multidetector CT imaging of the abdomen and pelvis was performed using the standard protocol following bolus administration of intravenous contrast. RADIATION DOSE REDUCTION: This exam was performed according to the departmental dose-optimization program which includes automated exposure control, adjustment of the mA and/or kV according to patient size and/or use of iterative reconstruction technique. CONTRAST:  75mL OMNIPAQUE  IOHEXOL  350 MG/ML SOLN COMPARISON:  None Available. FINDINGS: Lower chest: Lung bases are clear. Hepatobiliary: Cholelithiasis with stone in the gallbladder neck. Mild pericholecystic edema. No focal liver lesions. No bile duct dilatation. Pancreas: Unremarkable. No pancreatic ductal dilatation or surrounding inflammatory changes. Spleen: Normal in size without focal abnormality. Adrenals/Urinary Tract: Adrenal glands are unremarkable. Kidneys are normal, without renal calculi, focal lesion, or hydronephrosis. Bladder is unremarkable. Stomach/Bowel: Stomach is within normal limits. Appendix appears normal. No evidence of bowel wall thickening, distention, or inflammatory changes. Vascular/Lymphatic: No significant vascular findings are present. No enlarged abdominal or pelvic lymph nodes. Reproductive: Prostate is unremarkable. Other: No abdominal wall hernia or abnormality. No abdominopelvic ascites. Musculoskeletal: No acute or significant osseous findings. IMPRESSION: 1. Cholelithiasis with gallstone in the gallbladder neck. Mild gallbladder wall thickening with pericholecystic edema. Changes may indicate acute cholecystitis in the appropriate clinical setting. 2. Pancreas is unremarkable. No inflammatory changes or collections identified. 3. No evidence of bowel obstruction or inflammation. Electronically Signed   By: Elsie Gravely M.D.   On: 08/22/2023 03:50   DG Chest 2 View Result Date: 08/21/2023 CLINICAL DATA:  Chest pain EXAM: CHEST - 2 VIEW  COMPARISON:  08/19/2020 FINDINGS: The heart size and mediastinal contours are within normal limits. Both lungs are clear. The visualized skeletal structures are unremarkable. IMPRESSION: No active cardiopulmonary disease. Electronically Signed   By: Franky Crease M.D.   On: 08/21/2023 21:21     Assessment/Plan 58 yo M with symptomatic cholelithiasis.  LOS: 1 day   HIDA negative  LFTs trending up. No pain at this time.   Discussed with patient discharging home for outpatient cholecystectomy versus inpatient. Given uptrending LFTs, may be related to biliary etiology and recommend cholecystectomy this admission.  Last dose of Brilinta  Monday night, will plan for OR tomorrow PM versus Friday AM.  I reviewed hospitalist notes, last 24 h vitals and pain scores, last 48 h intake and output, last 24 h labs and trends, and last 24 h imaging results.  This care required high  level of medical decision making.    Richerd Silversmith, MD [facility] Surgery 08/23/2023, 8:52 AM Please see Amion for pager number during day hours 7:00am-4:30pm

## 2023-08-23 NOTE — Hospital Course (Addendum)
 Brief Narrative:  a 58 y.o. male with a past medical history of coronary artery disease status post MI in 2021 status post PCI who was in his usual state of health until about 7 PM last night when he started developing abdominal pain. CT of the abdomen pelvis showed stone in the gallbladder neck. Concern was for gallbladder etiology    Assessment & Plan:  Principal Problem:   Biliary colic Active Problems:   CAD S/P percutaneous coronary angioplasty     Abdominal pain with concern for gallbladder etiology Transaminitis CT findings are concerning for acute calculus cholecystitis.  HIDA and right upper quadrant ultrasound scan negative for any acute cholecystitis.  Brilinta  on hold on empiric IV Rocephin  and Flagyl .  Plans for OR per general surgery tomorrow or Friday pending OR schedule -Likely patient passed stone.   -Lipase improved    Coronary artery disease status post MI 2021 requiring PCI -Cardiac cath in 2022 showed stable findings.  Continue Coreg .  DVT prophylaxis: SCDs Start: 08/22/23 1115    Code Status: Full Code Family Communication:   Plans for laparoscopic cholecystectomy pending OR schedule  Subjective: No abdominal pain.  Feels better Spouse at bedside   Examination:  General exam: Appears calm and comfortable  Respiratory system: Clear to auscultation. Respiratory effort normal. Cardiovascular system: S1 & S2 heard, RRR. No JVD, murmurs, rubs, gallops or clicks. No pedal edema. Gastrointestinal system: Abdomen is nondistended, soft and nontender. No organomegaly or masses felt. Normal bowel sounds heard. Central nervous system: Alert and oriented. No focal neurological deficits. Extremities: Symmetric 5 x 5 power. Skin: No rashes, lesions or ulcers Psychiatry: Judgement and insight appear normal. Mood & affect appropriate.

## 2023-08-23 NOTE — Plan of Care (Signed)

## 2023-08-23 NOTE — Progress Notes (Signed)
 PROGRESS NOTE    [name] [name]  FMW:[id number] DOB: 1965-03-18 DOA: 08/21/2023 PCP: Berneta Elsie Sayre, MD    Brief Narrative:  a 58 y.o. male with a past medical history of coronary artery disease status post MI in 2021 status post PCI who was in his usual state of health until about 7 PM last night when he started developing abdominal pain. CT of the abdomen pelvis showed stone in the gallbladder neck. Concern was for gallbladder etiology    Assessment & Plan:  Principal Problem:   Biliary colic Active Problems:   CAD S/P percutaneous coronary angioplasty     Abdominal pain with concern for gallbladder etiology Transaminitis CT findings are concerning for acute calculus cholecystitis.  HIDA and right upper quadrant ultrasound scan negative for any acute cholecystitis.  Brilinta  on hold on empiric IV Rocephin  and Flagyl .  Plans for OR per general surgery tomorrow or Friday pending OR schedule -Likely patient passed stone.   -Lipase improved    Coronary artery disease status post MI 2021 requiring PCI -Cardiac cath in 2022 showed stable findings.  Continue Coreg .  DVT prophylaxis: SCDs Start: 08/22/23 1115    Code Status: Full Code Family Communication:   Plans for laparoscopic cholecystectomy pending OR schedule  Subjective: No abdominal pain.  Feels better Spouse at bedside   Examination:  General exam: Appears calm and comfortable  Respiratory system: Clear to auscultation. Respiratory effort normal. Cardiovascular system: S1 & S2 heard, RRR. No JVD, murmurs, rubs, gallops or clicks. No pedal edema. Gastrointestinal system: Abdomen is nondistended, soft and nontender. No organomegaly or masses felt. Normal bowel sounds heard. Central nervous system: Alert and oriented. No focal neurological deficits. Extremities: Symmetric 5 x 5 power. Skin: No rashes, lesions or ulcers Psychiatry: Judgement and insight appear normal. Mood & affect appropriate.                 Diet Orders (From admission, onward)     Start     Ordered   08/24/23 0001  Diet NPO time specified  Diet effective midnight        08/23/23 0848   08/23/23 0849  Diet clear liquid Fluid consistency: Thin  Diet effective now       Question:  Fluid consistency:  Answer:  Thin   08/23/23 0848            Objective: Vitals:   08/22/23 2131 08/22/23 2359 08/23/23 0402 08/23/23 0900  BP:  115/80 125/86 117/81  Pulse: (!) 52 (!) 56 60 (!) 54  Resp:  16 16 16   Temp:  98.5 F (36.9 C) 98.6 F (37 C) 98 F (36.7 C)  TempSrc:  Oral Oral Oral  SpO2:  97% 97% 99%    Intake/Output Summary (Last 24 hours) at 08/23/2023 1150 Last data filed at 08/23/2023 0600 Gross per 24 hour  Intake 1398.35 ml  Output --  Net 1398.35 ml   There were no vitals filed for this visit.  Scheduled Meds:  carvedilol   3.125 mg Oral BID   pantoprazole  (PROTONIX ) IV  40 mg Intravenous Q24H   Continuous Infusions:  sodium chloride  75 mL/hr at 08/23/23 0939   cefTRIAXone  (ROCEPHIN )  IV 2 g (08/23/23 0504)   metronidazole  500 mg (08/23/23 0547)    Nutritional status     There is no height or weight on file to calculate BMI.  Data Reviewed:   CBC: Recent Labs  Lab 08/21/23 2054 08/23/23 0517  WBC 8.0 6.1  HGB 14.4  13.5  HCT 41.8 39.2  MCV 90.1 90.3  PLT 269 209   Basic Metabolic Panel: Recent Labs  Lab 08/21/23 2054 08/23/23 0517  NA 137 140  K 3.9 3.8  CL 104 108  CO2 23 24  GLUCOSE 171* 83  BUN 10 8  CREATININE 1.09 1.00  CALCIUM  9.1 8.5*   GFR: CrCl cannot be calculated (Unknown ideal weight.). Liver Function Tests: Recent Labs  Lab 08/21/23 2054 08/23/23 0517  AST 31 92*  ALT 35 223*  ALKPHOS 64 88  BILITOT 1.0 1.2  PROT 6.5 5.7*  ALBUMIN 3.8 3.3*   Recent Labs  Lab 08/21/23 2054 08/23/23 0517  LIPASE 168* 27   No results for input(s): AMMONIA in the last 168 hours. Coagulation Profile: No results for input(s): INR, PROTIME in  the last 168 hours. Cardiac Enzymes: No results for input(s): CKTOTAL, CKMB, CKMBINDEX, TROPONINI in the last 168 hours. BNP (last 3 results) No results for input(s): PROBNP in the last 8760 hours. HbA1C: No results for input(s): HGBA1C in the last 72 hours. CBG: No results for input(s): GLUCAP in the last 168 hours. Lipid Profile: Recent Labs    08/23/23 0517  TRIG 44   Thyroid Function Tests: No results for input(s): TSH, T4TOTAL, FREET4, T3FREE, THYROIDAB in the last 72 hours. Anemia Panel: No results for input(s): VITAMINB12, FOLATE, FERRITIN, TIBC, IRON, RETICCTPCT in the last 72 hours. Sepsis Labs: No results for input(s): PROCALCITON, LATICACIDVEN in the last 168 hours.  No results found for this or any previous visit (from the past 240 hours).       Radiology Studies: NM Hepatobiliary Liver Func Result Date: 08/22/2023 CLINICAL DATA:  Right upper quadrant abdominal pain. EXAM: NUCLEAR MEDICINE HEPATOBILIARY IMAGING TECHNIQUE: Sequential images of the abdomen were obtained out to 60 minutes following intravenous administration of radiopharmaceutical. RADIOPHARMACEUTICALS:  5.07 mCi Tc-54m  Choletec  IV COMPARISON:  Studies of same day. FINDINGS: Prompt uptake and biliary excretion of activity by the liver is seen. Gallbladder activity is visualized, consistent with patency of cystic duct. Biliary activity passes into small bowel, consistent with patent common bile duct. IMPRESSION: Normal uptake within the gallbladder is noted. No definite evidence of cholecystitis. Electronically Signed   By: Lynwood Landy Raddle M.D.   On: 08/22/2023 16:30   US  Abdomen Limited RUQ (LIVER/GB) Result Date: 08/22/2023 EXAM: Right Upper Quadrant Abdominal Ultrasound TECHNIQUE: Real-time ultrasonography of the right upper quadrant of the abdomen was performed. COMPARISON: None. CLINICAL HISTORY: 07368 Cholecystitis. Abnormal CT scan. FINDINGS: LIVER: The liver  demonstrates normal echogenicity. No intrahepatic biliary ductal dilatation. No mass. BILIARY SYSTEM: Gallbladder wall measurement is 2.4 mm, within normal limits. No gallstones are visualized. No sonographic Murphy's sign is reported. Common bile duct is within normal limits measuring 3.7 mm. RIGHT KIDNEY: The right kidney is grossly unremarkable in appearances without evidence of hydronephrosis, echogenic calculate or worrisome mass lesions. PANCREAS: Visualized portions of the pancreas are unremarkable. OTHER: No right upper quadrant ascites. IMPRESSION: 1. No sonographic evidence of cholecystitis. Electronically signed by: Lonni Necessary MD 08/22/2023 06:20 AM EDT RP Workstation: [id number]   CT ABDOMEN PELVIS W CONTRAST Result Date: 08/22/2023 CLINICAL DATA:  Pancreatitis. Substernal chest pain and epigastric pain. EXAM: CT ABDOMEN AND PELVIS WITH CONTRAST TECHNIQUE: Multidetector CT imaging of the abdomen and pelvis was performed using the standard protocol following bolus administration of intravenous contrast. RADIATION DOSE REDUCTION: This exam was performed according to the departmental dose-optimization program which includes automated exposure control, adjustment of the mA  and/or kV according to patient size and/or use of iterative reconstruction technique. CONTRAST:  75mL OMNIPAQUE  IOHEXOL  350 MG/ML SOLN COMPARISON:  None Available. FINDINGS: Lower chest: Lung bases are clear. Hepatobiliary: Cholelithiasis with stone in the gallbladder neck. Mild pericholecystic edema. No focal liver lesions. No bile duct dilatation. Pancreas: Unremarkable. No pancreatic ductal dilatation or surrounding inflammatory changes. Spleen: Normal in size without focal abnormality. Adrenals/Urinary Tract: Adrenal glands are unremarkable. Kidneys are normal, without renal calculi, focal lesion, or hydronephrosis. Bladder is unremarkable. Stomach/Bowel: Stomach is within normal limits. Appendix appears normal. No evidence  of bowel wall thickening, distention, or inflammatory changes. Vascular/Lymphatic: No significant vascular findings are present. No enlarged abdominal or pelvic lymph nodes. Reproductive: Prostate is unremarkable. Other: No abdominal wall hernia or abnormality. No abdominopelvic ascites. Musculoskeletal: No acute or significant osseous findings. IMPRESSION: 1. Cholelithiasis with gallstone in the gallbladder neck. Mild gallbladder wall thickening with pericholecystic edema. Changes may indicate acute cholecystitis in the appropriate clinical setting. 2. Pancreas is unremarkable. No inflammatory changes or collections identified. 3. No evidence of bowel obstruction or inflammation. Electronically Signed   By: Elsie Gravely M.D.   On: 08/22/2023 03:50   DG Chest 2 View Result Date: 08/21/2023 CLINICAL DATA:  Chest pain EXAM: CHEST - 2 VIEW COMPARISON:  08/19/2020 FINDINGS: The heart size and mediastinal contours are within normal limits. Both lungs are clear. The visualized skeletal structures are unremarkable. IMPRESSION: No active cardiopulmonary disease. Electronically Signed   By: Franky Crease M.D.   On: 08/21/2023 21:21           LOS: 1 day   Time spent= 35 mins    Burgess JAYSON Dare, MD [facility]  If 7PM-7AM, please contact night-coverage  08/23/2023, 11:50 AM

## 2023-08-24 ENCOUNTER — Other Ambulatory Visit: Payer: Self-pay

## 2023-08-24 ENCOUNTER — Observation Stay (HOSPITAL_COMMUNITY): Admitting: Anesthesiology

## 2023-08-24 ENCOUNTER — Other Ambulatory Visit (HOSPITAL_COMMUNITY): Payer: Self-pay

## 2023-08-24 ENCOUNTER — Encounter (HOSPITAL_COMMUNITY): Payer: Self-pay | Admitting: Internal Medicine

## 2023-08-24 ENCOUNTER — Encounter (HOSPITAL_COMMUNITY)
Admission: EM | Disposition: A | Discharge status: Home / Self Care | Payer: Self-pay | Source: Home / Self Care | Attending: Internal Medicine

## 2023-08-24 DIAGNOSIS — K8071 Calculus of gallbladder and bile duct without cholecystitis with obstruction: Secondary | ICD-10-CM | POA: Diagnosis not present

## 2023-08-24 DIAGNOSIS — K8020 Calculus of gallbladder without cholecystitis without obstruction: Secondary | ICD-10-CM | POA: Insufficient documentation

## 2023-08-24 DIAGNOSIS — K811 Chronic cholecystitis: Secondary | ICD-10-CM | POA: Diagnosis not present

## 2023-08-24 DIAGNOSIS — I2510 Atherosclerotic heart disease of native coronary artery without angina pectoris: Secondary | ICD-10-CM | POA: Diagnosis not present

## 2023-08-24 DIAGNOSIS — Z9861 Coronary angioplasty status: Secondary | ICD-10-CM | POA: Diagnosis not present

## 2023-08-24 DIAGNOSIS — K8010 Calculus of gallbladder with chronic cholecystitis without obstruction: Secondary | ICD-10-CM | POA: Diagnosis not present

## 2023-08-24 DIAGNOSIS — I10 Essential (primary) hypertension: Secondary | ICD-10-CM | POA: Diagnosis not present

## 2023-08-24 DIAGNOSIS — K8050 Calculus of bile duct without cholangitis or cholecystitis without obstruction: Secondary | ICD-10-CM | POA: Diagnosis not present

## 2023-08-24 HISTORY — PX: INDOCYANINE GREEN FLUORESCENCE IMAGING (ICG): SHX7595

## 2023-08-24 HISTORY — PX: CHOLECYSTECTOMY: SHX55

## 2023-08-24 LAB — PHOSPHORUS: Phosphorus: 3.1 mg/dL (ref 2.5–4.6)

## 2023-08-24 LAB — MAGNESIUM: Magnesium: 1.8 mg/dL (ref 1.7–2.4)

## 2023-08-24 LAB — COMPREHENSIVE METABOLIC PANEL WITH GFR
ALT: 155 U/L — ABNORMAL HIGH (ref 0–44)
AST: 45 U/L — ABNORMAL HIGH (ref 15–41)
Albumin: 3.3 g/dL — ABNORMAL LOW (ref 3.5–5.0)
Alkaline Phosphatase: 86 U/L (ref 38–126)
Anion gap: 8 (ref 5–15)
BUN: 7 mg/dL (ref 6–20)
CO2: 27 mmol/L (ref 22–32)
Calcium: 9 mg/dL (ref 8.9–10.3)
Chloride: 104 mmol/L (ref 98–111)
Creatinine, Ser: 1.02 mg/dL (ref 0.61–1.24)
GFR, Estimated: >60 mL/min (ref >60)
Glucose, Bld: 80 mg/dL (ref 70–99)
Potassium: 3.6 mmol/L (ref 3.5–5.1)
Sodium: 139 mmol/L (ref 135–145)
Total Bilirubin: 1 mg/dL (ref 0.0–1.2)
Total Protein: 6.2 g/dL — ABNORMAL LOW (ref 6.5–8.1)

## 2023-08-24 LAB — CBC
HCT: 39.8 % (ref 39.0–52.0)
Hemoglobin: 14.1 g/dL (ref 13.0–17.0)
MCH: 31.6 pg (ref 26.0–34.0)
MCHC: 35.4 g/dL (ref 30.0–36.0)
MCV: 89.2 fL (ref 80.0–100.0)
Platelets: 221 K/uL (ref 150–400)
RBC: 4.46 MIL/uL (ref 4.22–5.81)
RDW: 11.5 % (ref 11.5–15.5)
WBC: 5.9 K/uL (ref 4.0–10.5)
nRBC: 0 % (ref 0.0–0.2)

## 2023-08-24 SURGERY — LAPAROSCOPIC CHOLECYSTECTOMY
Anesthesia: General | Site: Abdomen

## 2023-08-24 MED ORDER — CHLORHEXIDINE GLUCONATE 0.12 % MT SOLN: 15.0000 mL | Freq: Once | OROMUCOSAL | Status: AC

## 2023-08-24 MED ORDER — HEMOSTATIC AGENTS (NO CHARGE) OPTIME
TOPICAL | Status: DC | PRN
  Administered 2023-08-24: 1 via TOPICAL

## 2023-08-24 MED ORDER — ORAL CARE MOUTH RINSE: 15.0000 mL | Freq: Once | OROMUCOSAL | Status: AC

## 2023-08-24 MED ORDER — FENTANYL CITRATE (PF) 250 MCG/5ML IJ SOLN
INTRAMUSCULAR | Status: DC | PRN
  Administered 2023-08-24 (×2): 50 ug via INTRAVENOUS
  Administered 2023-08-24: 100 ug via INTRAVENOUS

## 2023-08-24 MED ORDER — ONDANSETRON HCL 4 MG/2ML IJ SOLN
INTRAMUSCULAR | Status: DC | PRN
  Administered 2023-08-24: 4 mg via INTRAVENOUS

## 2023-08-24 MED ORDER — PROPOFOL 10 MG/ML IV BOLUS
INTRAVENOUS | Status: AC
  Filled 2023-08-24: qty 20

## 2023-08-24 MED ORDER — FENTANYL CITRATE (PF) 100 MCG/2ML IJ SOLN: 25.0000 ug | INTRAMUSCULAR | Status: DC | PRN

## 2023-08-24 MED ORDER — OXYCODONE HCL 5 MG PO TABS
ORAL_TABLET | ORAL | Status: AC
  Filled 2023-08-24: qty 1

## 2023-08-24 MED ORDER — DEXAMETHASONE SODIUM PHOSPHATE 10 MG/ML IJ SOLN
INTRAMUSCULAR | Status: DC | PRN
  Administered 2023-08-24: 10 mg via INTRAVENOUS

## 2023-08-24 MED ORDER — OXYCODONE HCL 5 MG PO TABS
5.0000 mg | ORAL_TABLET | Freq: Once | ORAL | Status: AC | PRN
  Administered 2023-08-24: 5 mg via ORAL

## 2023-08-24 MED ORDER — LACTATED RINGERS IV SOLN: INTRAVENOUS | Status: DC

## 2023-08-24 MED ORDER — CHLORHEXIDINE GLUCONATE 0.12 % MT SOLN
OROMUCOSAL | Status: AC
  Administered 2023-08-24: 15 mL via OROMUCOSAL
  Filled 2023-08-24: qty 15

## 2023-08-24 MED ORDER — DEXAMETHASONE SODIUM PHOSPHATE 10 MG/ML IJ SOLN
INTRAMUSCULAR | Status: AC
Start: 2023-08-24 — End: 2023-08-24
  Filled 2023-08-24: qty 1

## 2023-08-24 MED ORDER — SUGAMMADEX SODIUM 200 MG/2ML IV SOLN
INTRAVENOUS | Status: AC
  Filled 2023-08-24: qty 2

## 2023-08-24 MED ORDER — LIDOCAINE 2% (20 MG/ML) 5 ML SYRINGE
INTRAMUSCULAR | Status: DC | PRN
  Administered 2023-08-24: 60 mg via INTRAVENOUS

## 2023-08-24 MED ORDER — ROCURONIUM BROMIDE 10 MG/ML (PF) SYRINGE
PREFILLED_SYRINGE | INTRAVENOUS | Status: DC | PRN
  Administered 2023-08-24: 20 mg via INTRAVENOUS
  Administered 2023-08-24: 50 mg via INTRAVENOUS

## 2023-08-24 MED ORDER — BUPIVACAINE-EPINEPHRINE 0.25% -1:200000 IJ SOLN
INTRAMUSCULAR | Status: DC | PRN
  Administered 2023-08-24: 30 mL

## 2023-08-24 MED ORDER — MIDAZOLAM HCL 2 MG/2ML IJ SOLN
INTRAMUSCULAR | Status: DC | PRN
  Administered 2023-08-24: 2 mg via INTRAVENOUS

## 2023-08-24 MED ORDER — ONDANSETRON HCL 4 MG/2ML IJ SOLN: 4.0000 mg | Freq: Four times a day (QID) | INTRAMUSCULAR | Status: DC | PRN

## 2023-08-24 MED ORDER — FENTANYL CITRATE (PF) 250 MCG/5ML IJ SOLN
INTRAMUSCULAR | Status: AC
  Filled 2023-08-24: qty 5

## 2023-08-24 MED ORDER — LIDOCAINE 2% (20 MG/ML) 5 ML SYRINGE
INTRAMUSCULAR | Status: AC
Start: 2023-08-24 — End: 2023-08-24
  Filled 2023-08-24: qty 5

## 2023-08-24 MED ORDER — 0.9 % SODIUM CHLORIDE (POUR BTL) OPTIME
TOPICAL | Status: DC | PRN
  Administered 2023-08-24: 1000 mL

## 2023-08-24 MED ORDER — INDOCYANINE GREEN 25 MG IV SOLR
1.2500 mg | Freq: Once | INTRAVENOUS | Status: AC
  Administered 2023-08-24: 1.25 mg via INTRAVENOUS
  Filled 2023-08-24: qty 10

## 2023-08-24 MED ORDER — OXYCODONE HCL 5 MG/5ML PO SOLN: 5.0000 mg | Freq: Once | ORAL | Status: AC | PRN

## 2023-08-24 MED ORDER — ROCURONIUM BROMIDE 10 MG/ML (PF) SYRINGE
PREFILLED_SYRINGE | INTRAVENOUS | Status: AC
  Filled 2023-08-24: qty 10

## 2023-08-24 MED ORDER — SODIUM CHLORIDE 0.9 % IR SOLN
Status: DC | PRN
  Administered 2023-08-24: 1000 mL

## 2023-08-24 MED ORDER — SUGAMMADEX SODIUM 200 MG/2ML IV SOLN
INTRAVENOUS | Status: DC | PRN
  Administered 2023-08-24: 200 mg via INTRAVENOUS

## 2023-08-24 MED ORDER — OXYCODONE HCL 5 MG PO TABS: 5.0000 mg | ORAL_TABLET | ORAL | 0 refills | Status: AC | PRN

## 2023-08-24 MED ORDER — BUPIVACAINE-EPINEPHRINE (PF) 0.25% -1:200000 IJ SOLN
INTRAMUSCULAR | Status: AC
  Filled 2023-08-24: qty 30

## 2023-08-24 MED ORDER — MIDAZOLAM HCL 2 MG/2ML IJ SOLN
INTRAMUSCULAR | Status: AC
  Filled 2023-08-24: qty 2

## 2023-08-24 MED ORDER — PROPOFOL 10 MG/ML IV BOLUS
INTRAVENOUS | Status: DC | PRN
  Administered 2023-08-24: 150 mg via INTRAVENOUS
  Administered 2023-08-24: 30 mg via INTRAVENOUS

## 2023-08-24 MED ORDER — OXYCODONE HCL 5 MG PO TABS
5.0000 mg | ORAL_TABLET | ORAL | Status: DC | PRN
  Administered 2023-08-24: 5 mg via ORAL
  Filled 2023-08-24: qty 2

## 2023-08-24 MED ORDER — ONDANSETRON HCL 4 MG/2ML IJ SOLN
INTRAMUSCULAR | Status: AC
  Filled 2023-08-24: qty 2

## 2023-08-24 MED ORDER — DEXMEDETOMIDINE HCL IN NACL 80 MCG/20ML IV SOLN
INTRAVENOUS | Status: DC | PRN
  Administered 2023-08-24: 12 ug via INTRAVENOUS
  Administered 2023-08-24: 8 ug via INTRAVENOUS

## 2023-08-24 SURGICAL SUPPLY — 39 items
BLADE CLIPPER SURG (BLADE) IMPLANT
CANISTER SUCTION 3000ML PPV (SUCTIONS) ×2 IMPLANT
CHLORAPREP W/TINT 26 (MISCELLANEOUS) ×2 IMPLANT
CLIP LIGATING HEMO LOK XL GOLD (MISCELLANEOUS) IMPLANT
CLIP LIGATING HEMO O LOK GREEN (MISCELLANEOUS) ×2 IMPLANT
CNTNR URN SCR LID CUP LEK RST (MISCELLANEOUS) ×2 IMPLANT
COVER SURGICAL LIGHT HANDLE (MISCELLANEOUS) ×2 IMPLANT
DERMABOND ADVANCED .7 DNX12 (GAUZE/BANDAGES/DRESSINGS) ×2 IMPLANT
ELECTRODE REM PT RTRN 9FT ADLT (ELECTROSURGICAL) ×2 IMPLANT
GLOVE BIOGEL PI MICRO STRL 6 (GLOVE) ×2 IMPLANT
GLOVE INDICATOR 6.5 STRL GRN (GLOVE) ×2 IMPLANT
GOWN STRL REUS W/ TWL LRG LVL3 (GOWN DISPOSABLE) ×6 IMPLANT
GRASPER SUT TROCAR 14GX15 (MISCELLANEOUS) ×2 IMPLANT
HEMOSTAT ARISTA ABSORB 3G PWDR (HEMOSTASIS) IMPLANT
IRRIGATION SUCT STRKRFLW 2 WTP (MISCELLANEOUS) ×2 IMPLANT
KIT BASIN OR (CUSTOM PROCEDURE TRAY) ×2 IMPLANT
KIT IMAGING PINPOINTPAQ (MISCELLANEOUS) IMPLANT
KIT TURNOVER KIT B (KITS) ×2 IMPLANT
LHOOK LAP DISP 36CM (ELECTROSURGICAL) ×2 IMPLANT
NDL INSUFFLATION 14GA 120MM (NEEDLE) ×2 IMPLANT
NEEDLE INSUFFLATION 14GA 120MM (NEEDLE) ×2 IMPLANT
NS IRRIG 1000ML POUR BTL (IV SOLUTION) ×2 IMPLANT
PAD ARMBOARD POSITIONER FOAM (MISCELLANEOUS) ×2 IMPLANT
PENCIL BUTTON HOLSTER BLD 10FT (ELECTRODE) ×2 IMPLANT
POUCH LAPAROSCOPIC INSTRUMENT (MISCELLANEOUS) ×2 IMPLANT
SCISSORS LAP 5X35 DISP (ENDOMECHANICALS) ×2 IMPLANT
SET TUBE SMOKE EVAC HIGH FLOW (TUBING) ×2 IMPLANT
SLEEVE Z-THREAD 5X100MM (TROCAR) ×4 IMPLANT
SPECIMEN JAR SMALL (MISCELLANEOUS) ×2 IMPLANT
SUT MNCRL AB 4-0 PS2 18 (SUTURE) ×2 IMPLANT
SUT VICRYL 0 UR6 27IN ABS (SUTURE) IMPLANT
SYSTEM BAG RETRIEVAL 10MM (BASKET) ×2 IMPLANT
TOWEL GREEN STERILE (TOWEL DISPOSABLE) IMPLANT
TOWEL GREEN STERILE FF (TOWEL DISPOSABLE) ×2 IMPLANT
TRAY LAPAROSCOPIC MC (CUSTOM PROCEDURE TRAY) ×2 IMPLANT
TROCAR Z THREAD OPTICAL 12X100 (TROCAR) ×2 IMPLANT
TROCAR Z-THREAD OPTICAL 5X100M (TROCAR) ×2 IMPLANT
WARMER LAPAROSCOPE (MISCELLANEOUS) ×2 IMPLANT
WATER STERILE IRR 1000ML POUR (IV SOLUTION) ×2 IMPLANT

## 2023-08-24 NOTE — Anesthesia Procedure Notes (Signed)
 Procedure Name: Intubation Date/Time: 08/24/2023 8:49 AM  Performed by: Burnie Lauraine POUR, RNPre-anesthesia Checklist: Patient identified, Emergency Drugs available, Suction available and Patient being monitored Patient Re-evaluated:Patient Re-evaluated prior to induction Oxygen Delivery Method: Circle system utilized Preoxygenation: Pre-oxygenation with 100% oxygen Induction Type: IV induction Ventilation: Two handed mask ventilation required and Oral airway inserted - appropriate to patient size Laryngoscope Size: Mac and 4 Grade View: Grade I Tube type: Oral Tube size: 7.5 mm Number of attempts: 1 Airway Equipment and Method: Stylet and Oral airway Placement Confirmation: ETT inserted through vocal cords under direct vision, positive ETCO2 and breath sounds checked- equal and bilateral Secured at: 23 cm Tube secured with: Tape Dental Injury: Teeth and Oropharynx as per pre-operative assessment

## 2023-08-24 NOTE — Discharge Instructions (Signed)

## 2023-08-24 NOTE — Transfer of Care (Signed)
 Immediate Anesthesia Transfer of Care Note  Patient: [name] [name]  Procedure(s) Performed: LAPAROSCOPIC CHOLECYSTECTOMY (Abdomen) INDOCYANINE GREEN  FLUORESCENCE IMAGING (ICG)  Patient Location: PACU  Anesthesia Type:General  Level of Consciousness: drowsy and patient cooperative  Airway & Oxygen Therapy: Patient Spontanous Breathing and Patient connected to face mask oxygen  Post-op Assessment: Report given to RN, Post -op Vital signs reviewed and stable, and Patient moving all extremities X 4  Post vital signs: Reviewed and stable  Last Vitals:  Vitals Value Taken Time  BP 119/77 08/24/23 10:37  Temp    Pulse 73 08/24/23 10:40  Resp 10 08/24/23 10:40  SpO2 91 % 08/24/23 10:40  Vitals shown include unfiled device data.  Last Pain:  Vitals:   08/24/23 0813  TempSrc:   PainSc: 0-No pain      Patients Stated Pain Goal: 3 (08/24/23 0813)  Complications: No notable events documented.

## 2023-08-24 NOTE — Progress Notes (Signed)
 Progress Note  * Day of Surgery *  Interval: NAEO, LFTs downtrending  Objective: Vital signs in last 24 hours: Temp:  [97.9 F (36.6 C)-98.2 F (36.8 C)] 98.2 F (36.8 C) (07/10 0757) Pulse Rate:  [54-63] 63 (07/10 0757) Resp:  [15-18] 16 (07/10 0757) BP: (105-129)/(72-87) 124/86 (07/10 0757) SpO2:  [95 %-99 %] 96 % (07/10 0757) Weight:  [80.7 kg] 80.7 kg (07/10 0757) Last BM Date : 08/22/23  Intake/Output from previous day: No intake/output data recorded. Intake/Output this shift: No intake/output data recorded.  PE: General: NAD Heart: regular, rate, and rhythm Lungs: Normal work of breathing on room air Abd: soft NTND MS: moves all 4 extremities Skin: warm and dry with no masses, lesions, or rashes Neuro: No focal neurologic deficits Psych: A&Ox3 with an appropriate affect.    Lab Results:  Recent Labs    08/23/23 0517 08/24/23 0529  WBC 6.1 5.9  HGB 13.5 14.1  HCT 39.2 39.8  PLT 209 221   BMET Recent Labs    08/23/23 0517 08/24/23 0529  NA 140 139  K 3.8 3.6  CL 108 104  CO2 24 27  GLUCOSE 83 80  BUN 8 7  CREATININE 1.00 1.02  CALCIUM  8.5* 9.0   PT/INR No results for input(s): LABPROT, INR in the last 72 hours. CMP     Component Value Date/Time   NA 139 08/24/2023 0529   NA 142 12/29/2020 0810   K 3.6 08/24/2023 0529   CL 104 08/24/2023 0529   CO2 27 08/24/2023 0529   GLUCOSE 80 08/24/2023 0529   BUN 7 08/24/2023 0529   BUN 15 12/29/2020 0810   CREATININE 1.02 08/24/2023 0529   CALCIUM  9.0 08/24/2023 0529   PROT 6.2 (L) 08/24/2023 0529   PROT 6.8 12/29/2020 0810   ALBUMIN 3.3 (L) 08/24/2023 0529   ALBUMIN 4.8 12/29/2020 0810   AST 45 (H) 08/24/2023 0529   ALT 155 (H) 08/24/2023 0529   ALKPHOS 86 08/24/2023 0529   BILITOT 1.0 08/24/2023 0529   BILITOT 0.7 12/29/2020 0810   GFRNONAA >60 08/24/2023 0529   GFRAA 97 09/24/2019 0000   Lipase     Component Value Date/Time   LIPASE 27 08/23/2023 0517        Studies/Results: NM Hepatobiliary Liver Func Result Date: 08/22/2023 CLINICAL DATA:  Right upper quadrant abdominal pain. EXAM: NUCLEAR MEDICINE HEPATOBILIARY IMAGING TECHNIQUE: Sequential images of the abdomen were obtained out to 60 minutes following intravenous administration of radiopharmaceutical. RADIOPHARMACEUTICALS:  5.07 mCi Tc-14m  Choletec  IV COMPARISON:  Studies of same day. FINDINGS: Prompt uptake and biliary excretion of activity by the liver is seen. Gallbladder activity is visualized, consistent with patency of cystic duct. Biliary activity passes into small bowel, consistent with patent common bile duct. IMPRESSION: Normal uptake within the gallbladder is noted. No definite evidence of cholecystitis. Electronically Signed   By: Lynwood Landy Raddle M.D.   On: 08/22/2023 16:30     Assessment/Plan 58 yo M with symptomatic cholelithiasis.  LOS: 1 day   - Proceed to OR for laparoscopic cholecystectomy with ICG - We discussed the etiology of patient's pain, we discussed treatment options and recommended surgery. We discussed details of surgery including general anesthesia, laparoscopic approach, identification of cystic duct and common bile duct. Ligation of cystic duct and cystic artery. Possible need for intraoperative cholangiogram, open procedure, and subtotal cholecystectomy. Possible risks of common bile duct injury, injury to surrounding structures, bile leak, bleeding, infection, diarrhea, retained stone and hernia.  The patient showed good understanding and all questions were answered   I reviewed hospitalist notes, last 24 h vitals and pain scores, last 48 h intake and output, last 24 h labs and trends, and last 24 h imaging results.  This care required high  level of medical decision making.    Richerd Silversmith, MD [facility] Surgery 08/24/2023, 8:17 AM Please see Amion for pager number during day hours 7:00am-4:30pm

## 2023-08-24 NOTE — Plan of Care (Signed)

## 2023-08-24 NOTE — Anesthesia Preprocedure Evaluation (Signed)
 Anesthesia Evaluation  Patient identified by MRN, date of birth, ID band Patient awake    Reviewed: Allergy & Precautions, H&P , NPO status , Patient's Chart, lab work & pertinent test results  Airway Mallampati: II   Neck ROM: full    Dental   Pulmonary neg pulmonary ROS   breath sounds clear to auscultation       Cardiovascular hypertension, + angina  + CAD, + Past MI and + Cardiac Stents   Rhythm:regular Rate:Normal     Neuro/Psych    GI/Hepatic   Endo/Other    Renal/GU      Musculoskeletal   Abdominal   Peds  Hematology   Anesthesia Other Findings   Reproductive/Obstetrics                              Anesthesia Physical Anesthesia Plan  ASA: 3  Anesthesia Plan: General   Post-op Pain Management:    Induction: Intravenous  PONV Risk Score and Plan: 2 and Ondansetron , Dexamethasone , Midazolam  and Treatment may vary due to age or medical condition  Airway Management Planned: Oral ETT  Additional Equipment:   Intra-op Plan:   Post-operative Plan: Extubation in OR  Informed Consent: I have reviewed the patients History and Physical, chart, labs and discussed the procedure including the risks, benefits and alternatives for the proposed anesthesia with the patient or authorized representative who has indicated his/her understanding and acceptance.     Dental advisory given  Plan Discussed with: CRNA, Anesthesiologist and Surgeon  Anesthesia Plan Comments:         Anesthesia Quick Evaluation

## 2023-08-24 NOTE — Op Note (Signed)
 08/24/2023 10:22 AM  PATIENT: [name] [name]  58 y.o. male  Patient Care Team: Berneta [name] Sayre, MD as PCP - General (Family Medicine) Anner Alm ORN, MD as PCP - Cardiology (Cardiology)  PRE-OPERATIVE DIAGNOSIS: cholelithiasis  POST-OPERATIVE DIAGNOSIS: cholelithiasis with chronic cholecystitis  PROCEDURE: laparoscopic cholecystectomy with ICG  SURGEON: Orie Silversmith, MD  ASSISTANT: None  ANESTHESIA: General endotracheal  EBL: 10cc  DRAINS: None  SPECIMEN: Gallbladder  COUNTS: Sponge, needle and instrument counts were reported correct x2 at the conclusion of the operation  DISPOSITION: PACU in satisfactory condition  COMPLICATIONS: None  FINDINGS: Chronically inflamed gallbladder with omental adhesions.  ICG fluorescence through the gallbladder, cystic duct, common bile duct, and into small bowel.  Critical view of safety achieved prior to clipping artery and duct.  DESCRIPTION:  Preoperative indocyanine green  was administered in preoperative holding. The patient was identified & brought into the operating room. He was then positioned supine on the OR table. SCDs were in place and active during the entire case. He then underwent general endotracheal anesthesia. Pressure points were padded. Hair on the abdomen was clipped by the OR team. The abdomen was prepped and draped in the standard sterile fashion. Antibiotics were administered. A surgical timeout was performed and confirmed our plan.  A small incision was made in the LUQ at Palmer's point and a veress needle was inserted. Air was aspirated and subsequent positive drop test. Abdomen then insufflated to . A periumbilical incision was then made and a 5mm trocar optiview using a 30 degree scope was inserted and the abdomen was entered under direct visualization. Inspection confirmed no evidence of trocar or veress site complications. The veress was then removed.    The patient was then positioned in reverse  Trendelenburg with slight left side down. A 12 mm supxiphoid trocar was placed under direct visualization and  two additional 5mm trocars were placed along the right subcostal line - one 5mm port in mid subcostal region, another 5mm port in the right flank near the anterior axillary line.  The liver and gallbladder were inspected.  There was chronic inflammation, edema, and extensive omental adhesions to the gallbladder.  The omental adhesions were lysed using electrocautery and gently dissected away from the gallbladder.  The gallbladder fundus was grasped and elevated cephalad. An additional grasper was then placed on the infundibulum of the gallbladder and the infundibulum was retracted laterally. Staying high on the gallbladder, the peritoneum on both sides of the gallbladder was opened with hook cautery.  ICG was employed identifying the cystic duct and its insertion into the common bile duct.  We then continued gentle blunt dissection with a [place]  dissector working down into Calot's triangle. The cystic duct was identified and carefully circumferentially dissected. The cystic artery was also identified and carefully circumferentially dissected. The space between the cystic artery and hepatocystic plate was developed such that a good view of the liver could be seen through a window medial to the cystic artery. The triangle of Calot had been cleared of all fibrofatty tissue. At this point, a critical view of safety was achieved and the only structures visualized was the skeletonized cystic duct laterally, the skeletonized cystic artery and the liver through the window medial to the artery. A posterior cystic artery was noted  Attention turned to infrared fluorescent cholangiography with indocyanine green  which was visualized within the common hepatic duct, common bile duct, cystic duct and small bowel.  The cystic artery was clipped with 2 hemolock clips on the  patient side and 1 clip on the specimen  side.  The posterior artery was clipped with 1 Hemoclip on patient's side and 1 clip on specimen side.  Both arteries were then divided.  The cystic duct was then clipped with 2 Hem-o-lok clips on the patient's side and 1 clip on the specimen side and then divided.  The gallbladder was then freed from its remaining attachments to the liver using electrocautery and placed into an endocatch bag. The RUQ was gently irrigated with sterile saline.  There was some minimal ooze in the inferior portion of the gallbladder fossa without extensive bleeding.  Arista was placed in this area and hemostasis was then verified. The clips were in good position; the gallbladder fossa was dry. The rest of the abdomen was inspected no injury nor bleeding elsewhere was identified.  The endocatch bag containing the gallbladder was then removed from the subxiphoid port site and passed off as specimen. The subxiphoid port fascia was then closed in a figured of eight fashion with 0 vicryl using a suture passer. The RUQ ports were removed under direct visualization and noted to be hemostatic.SABRA The fascia was palpated and noted to be completely closed. The abdomen was then desufflated and the periumbilical trocar removed. The skin of all incision sites was approximated with 4-0 monocryl subcuticular suture and dermabond applied.  was then awakened from anesthesia, extubated, and transferred to a stretcher for transport to PACU in satisfactory condition.  Instrument, sponge, and needle counts were correct at closure and at the conclusion of the case.   Orie Silversmith, MD [facility]

## 2023-08-24 NOTE — Discharge Summary (Signed)
 Physician Discharge Summary   Patient: [name] [name] MRN: [id number] DOB: 28-Nov-1965  Admit date:     08/21/2023  Discharge date: 08/24/23  Discharge Physician: Concepcion Riser   PCP: Berneta Elsie Sayre, MD   Recommendations at discharge:    PCP follow up in 1 week. Surgery clinic follow up as scheduled.  Discharge Diagnoses: Principal Problem:   Biliary colic Active Problems:   CAD S/P percutaneous coronary angioplasty   Cholelithiasis  Resolved Problems:   * No resolved hospital problems. *  Hospital Course: Brief Narrative:  a 58 y.o. male with a past medical history of coronary artery disease status post MI in 2021 status post PCI who was in his usual state of health until about 7 PM last night when he started developing abdominal pain. CT of the abdomen pelvis showed stone in the gallbladder neck. Concern was for gallbladder etiology    Assessment & Plan:  Principal Problem:   Biliary colic Active Problems:   CAD S/P percutaneous coronary angioplasty     Abdominal pain with concern for gallbladder etiology Transaminitis CT findings are concerning for acute calculus cholecystitis.  HIDA and right upper quadrant ultrasound scan negative for any acute cholecystitis.  Brilinta  on hold, started on empiric IV Rocephin  and Flagyl .   Patient had laparoscopic cholecystectomy 08/24/23. He tolerated the procedure well. Able to eat and ambulate. Advised to follow with PCP, surgery clinic upon discharge. Patient and wife understand and agree. Brilinta  to be held 2 more days per surgery team. Advised to restart after 2 days.   Coronary artery disease status post MI 2021 requiring PCI Cardiac cath in 2022 showed stable findings.  Continue Coreg .       Consultants: General surgery Procedures performed: lap chole  Disposition: Home Diet recommendation:  Discharge Diet Orders (From admission, onward)     Start     Ordered   08/24/23 0000  Diet - low sodium  heart healthy        08/24/23 1702           Cardiac diet DISCHARGE MEDICATION: Allergies as of 08/24/2023   No Known Allergies      Medication List     PAUSE taking these medications    Brilinta  60 MG Tabs tablet Wait to take this until: August 27, 2023 Generic drug: ticagrelor  TAKE 1 TABLET BY MOUTH TWICE A DAY       STOP taking these medications    ibuprofen 200 MG tablet Commonly known as: ADVIL       TAKE these medications    ASPIRIN  81 PO Take 324 mg by mouth once as needed (chest pain).   atorvastatin  40 MG tablet Commonly known as: LIPITOR  TAKE 1 TABLET BY MOUTH EVERY DAY   Azelastine  HCl 137 MCG/SPRAY Soln 1 spray each nostril twice daily. What changed:  how much to take how to take this when to take this reasons to take this additional instructions   carvedilol  3.125 MG tablet Commonly known as: COREG  TAKE 1 TABLET BY MOUTH TWICE A DAY   diclofenac  Sodium 1 % Gel Commonly known as: Voltaren  Apply a small grape sized dollop onto affected joint up to 4 times daily. What changed:  how much to take how to take this when to take this reasons to take this additional instructions   ezetimibe  10 MG tablet Commonly known as: ZETIA  TAKE 1 TABLET BY MOUTH EVERY DAY   nitroGLYCERIN  0.4 MG SL tablet Commonly known as: NITROSTAT  Place 1 tablet (  0.4 mg total) under the tongue every 5 (five) minutes as needed for chest pain (up to 3 doses. If taking 3rd dose, call 911.).   oxyCODONE  5 MG immediate release tablet Commonly known as: Roxicodone  Take 1 tablet (5 mg total) by mouth every 4 (four) hours as needed.        Follow-up Information     Maczis, Puja Gosai, PA-C. Go on 09/14/2023.   Specialty: General Surgery Why: 10 AM, please arrive 30 min prior to appointment time to check in. Contact information: [address] [facility] [place] [address] [phone]                Discharge  Exam: Filed Weights   08/24/23 0757  Weight: 80.7 kg      08/24/2023   11:29 AM 08/24/2023   11:10 AM 08/24/2023   11:00 AM  Vitals with BMI  Systolic 124 114 891  Diastolic 82 81 52  Pulse 50 53 58    General - Middle aged Caucasian male, no apparent distress HEENT - PERRLA, EOMI, atraumatic head, non tender sinuses. Lung - Clear, no rales, rhonchi, wheezes. Heart - S1, S2 heard, no murmurs, rubs, no pedal edema. Abdomen - Soft, non tender, lap scars clean, bowel sounds good Neuro - Alert, awake and oriented x 3, non focal exam. Skin - Warm and dry.  Condition at discharge: stable  The results of significant diagnostics from this hospitalization (including imaging, microbiology, ancillary and laboratory) are listed below for reference.   Imaging Studies: NM Hepatobiliary Liver Func Result Date: 08/22/2023 CLINICAL DATA:  Right upper quadrant abdominal pain. EXAM: NUCLEAR MEDICINE HEPATOBILIARY IMAGING TECHNIQUE: Sequential images of the abdomen were obtained out to 60 minutes following intravenous administration of radiopharmaceutical. RADIOPHARMACEUTICALS:  5.07 mCi Tc-4m  Choletec  IV COMPARISON:  Studies of same day. FINDINGS: Prompt uptake and biliary excretion of activity by the liver is seen. Gallbladder activity is visualized, consistent with patency of cystic duct. Biliary activity passes into small bowel, consistent with patent common bile duct. IMPRESSION: Normal uptake within the gallbladder is noted. No definite evidence of cholecystitis. Electronically Signed   By: Lynwood Landy Raddle M.D.   On: 08/22/2023 16:30   US  Abdomen Limited RUQ (LIVER/GB) Result Date: 08/22/2023 EXAM: Right Upper Quadrant Abdominal Ultrasound TECHNIQUE: Real-time ultrasonography of the right upper quadrant of the abdomen was performed. COMPARISON: None. CLINICAL HISTORY: [address]368 Cholecystitis. Abnormal CT scan. FINDINGS: LIVER: The liver demonstrates normal echogenicity. No intrahepatic biliary ductal  dilatation. No mass. BILIARY SYSTEM: Gallbladder wall measurement is 2.4 mm, within normal limits. No gallstones are visualized. No sonographic Murphy's sign is reported. Common bile duct is within normal limits measuring 3.7 mm. RIGHT KIDNEY: The right kidney is grossly unremarkable in appearances without evidence of hydronephrosis, echogenic calculate or worrisome mass lesions. PANCREAS: Visualized portions of the pancreas are unremarkable. OTHER: No right upper quadrant ascites. IMPRESSION: 1. No sonographic evidence of cholecystitis. Electronically signed by: Lonni Necessary MD 08/22/2023 06:20 AM EDT RP Workstation: [id number]   CT ABDOMEN PELVIS W CONTRAST Result Date: 08/22/2023 CLINICAL DATA:  Pancreatitis. Substernal chest pain and epigastric pain. EXAM: CT ABDOMEN AND PELVIS WITH CONTRAST TECHNIQUE: Multidetector CT imaging of the abdomen and pelvis was performed using the standard protocol following bolus administration of intravenous contrast. RADIATION DOSE REDUCTION: This exam was performed according to the departmental dose-optimization program which includes automated exposure control, adjustment of the mA and/or kV according to patient size and/or use of iterative reconstruction  technique. CONTRAST:  75mL OMNIPAQUE  IOHEXOL  350 MG/ML SOLN COMPARISON:  None Available. FINDINGS: Lower chest: Lung bases are clear. Hepatobiliary: Cholelithiasis with stone in the gallbladder neck. Mild pericholecystic edema. No focal liver lesions. No bile duct dilatation. Pancreas: Unremarkable. No pancreatic ductal dilatation or surrounding inflammatory changes. Spleen: Normal in size without focal abnormality. Adrenals/Urinary Tract: Adrenal glands are unremarkable. Kidneys are normal, without renal calculi, focal lesion, or hydronephrosis. Bladder is unremarkable. Stomach/Bowel: Stomach is within normal limits. Appendix appears normal. No evidence of bowel wall thickening, distention, or inflammatory changes.  Vascular/Lymphatic: No significant vascular findings are present. No enlarged abdominal or pelvic lymph nodes. Reproductive: Prostate is unremarkable. Other: No abdominal wall hernia or abnormality. No abdominopelvic ascites. Musculoskeletal: No acute or significant osseous findings. IMPRESSION: 1. Cholelithiasis with gallstone in the gallbladder neck. Mild gallbladder wall thickening with pericholecystic edema. Changes may indicate acute cholecystitis in the appropriate clinical setting. 2. Pancreas is unremarkable. No inflammatory changes or collections identified. 3. No evidence of bowel obstruction or inflammation. Electronically Signed   By: Elsie Gravely M.D.   On: 08/22/2023 03:50   DG Chest 2 View Result Date: 08/21/2023 CLINICAL DATA:  Chest pain EXAM: CHEST - 2 VIEW COMPARISON:  08/19/2020 FINDINGS: The heart size and mediastinal contours are within normal limits. Both lungs are clear. The visualized skeletal structures are unremarkable. IMPRESSION: No active cardiopulmonary disease. Electronically Signed   By: Franky Crease M.D.   On: 08/21/2023 21:21    Microbiology: Results for orders placed or performed during the hospital encounter of 08/19/20  Resp Panel by RT-PCR (Flu A&B, Covid) Nasopharyngeal Swab     Status: None   Collection Time: 08/20/20  1:40 AM   Specimen: Nasopharyngeal Swab; Nasopharyngeal(NP) swabs in vial transport medium  Result Value Ref Range Status   SARS Coronavirus 2 by RT PCR NEGATIVE NEGATIVE Final    Comment: (NOTE) SARS-CoV-2 target nucleic acids are NOT DETECTED.  The SARS-CoV-2 RNA is generally detectable in upper respiratory specimens during the acute phase of infection. The lowest concentration of SARS-CoV-2 viral copies this assay can detect is 138 copies/mL. A negative result does not preclude SARS-Cov-2 infection and should not be used as the sole basis for treatment or other patient management decisions. A negative result may occur with  improper  specimen collection/handling, submission of specimen other than nasopharyngeal swab, presence of viral mutation(s) within the areas targeted by this assay, and inadequate number of viral copies(<138 copies/mL). A negative result must be combined with clinical observations, patient history, and epidemiological information. The expected result is Negative.  Fact Sheet for Patients:  BloggerCourse.com  Fact Sheet for Healthcare Providers:  SeriousBroker.it  This test is no t yet approved or cleared by the [place]  FDA and  has been authorized for detection and/or diagnosis of SARS-CoV-2 by FDA under an Emergency Use Authorization (EUA). This EUA will remain  in effect (meaning this test can be used) for the duration of the COVID-19 declaration under Section 564(b)(1) of the Act, 21 U.S.C.section 360bbb-3(b)(1), unless the authorization is terminated  or revoked sooner.       Influenza A by PCR NEGATIVE NEGATIVE Final   Influenza B by PCR NEGATIVE NEGATIVE Final    Comment: (NOTE) The Xpert Xpress SARS-CoV-2/FLU/RSV plus assay is intended as an aid in the diagnosis of influenza from Nasopharyngeal swab specimens and should not be used as a sole basis for treatment. Nasal washings and aspirates are unacceptable for Xpert Xpress SARS-CoV-2/FLU/RSV testing.  Fact Sheet for  Patients: BloggerCourse.com  Fact Sheet for Healthcare Providers: SeriousBroker.it  This test is not yet approved or cleared by the [place]  FDA and has been authorized for detection and/or diagnosis of SARS-CoV-2 by FDA under an Emergency Use Authorization (EUA). This EUA will remain in effect (meaning this test can be used) for the duration of the COVID-19 declaration under Section 564(b)(1) of the Act, 21 U.S.C. section 360bbb-3(b)(1), unless the authorization is terminated or revoked.  Performed at  [facility], [address], [place], [place] [address]     Labs: CBC: Recent Labs  Lab 08/21/23 2054 08/23/23 0517 08/24/23 0529  WBC 8.0 6.1 5.9  HGB 14.4 13.5 14.1  HCT 41.8 39.2 39.8  MCV 90.1 90.3 89.2  PLT 269 209 221   Basic Metabolic Panel: Recent Labs  Lab 08/21/23 2054 08/23/23 0517 08/24/23 0529  NA 137 140 139  K 3.9 3.8 3.6  CL 104 108 104  CO2 23 24 27   GLUCOSE 171* 83 80  BUN 10 8 7   CREATININE 1.09 1.00 1.02  CALCIUM  9.1 8.5* 9.0  MG  --   --  1.8  PHOS  --   --  3.1   Liver Function Tests: Recent Labs  Lab 08/21/23 2054 08/23/23 0517 08/24/23 0529  AST 31 92* 45*  ALT 35 223* 155*  ALKPHOS 64 88 86  BILITOT 1.0 1.2 1.0  PROT 6.5 5.7* 6.2*  ALBUMIN 3.8 3.3* 3.3*   CBG: No results for input(s): GLUCAP in the last 168 hours.  Discharge time spent: 35 minutes.  Signed: Concepcion Riser, MD [facility] 08/24/2023

## 2023-08-25 ENCOUNTER — Encounter (HOSPITAL_COMMUNITY): Payer: Self-pay | Admitting: General Surgery

## 2023-08-25 LAB — SURGICAL PATHOLOGY

## 2023-08-25 NOTE — Anesthesia Postprocedure Evaluation (Signed)
 Anesthesia Post Note  Patient: [name] [name]  Procedure(s) Performed: LAPAROSCOPIC CHOLECYSTECTOMY (Abdomen) INDOCYANINE GREEN  FLUORESCENCE IMAGING (ICG)     Patient location during evaluation: PACU Anesthesia Type: General Level of consciousness: awake and alert Pain management: pain level controlled Vital Signs Assessment: post-procedure vital signs reviewed and stable Respiratory status: spontaneous breathing, nonlabored ventilation, respiratory function stable and patient connected to nasal cannula oxygen Cardiovascular status: blood pressure returned to baseline and stable Postop Assessment: no apparent nausea or vomiting Anesthetic complications: no   No notable events documented.  Last Vitals:  Vitals:   08/24/23 1129 08/24/23 1716  BP: 124/82 115/76  Pulse: (!) 50 61  Resp: 20   Temp: (!) 36.3 C 36.6 C  SpO2: 95% 96%    Last Pain:  Vitals:   08/24/23 1716  TempSrc: Oral  PainSc:                  Earnie Bechard S

## 2023-09-02 ENCOUNTER — Other Ambulatory Visit: Payer: Self-pay | Admitting: Cardiology

## 2023-09-03 ENCOUNTER — Other Ambulatory Visit: Payer: Self-pay | Admitting: Cardiology

## 2023-10-11 ENCOUNTER — Telehealth: Payer: Self-pay

## 2023-10-11 DIAGNOSIS — Z86018 Personal history of other benign neoplasm: Secondary | ICD-10-CM | POA: Diagnosis not present

## 2023-10-11 DIAGNOSIS — Z9049 Acquired absence of other specified parts of digestive tract: Secondary | ICD-10-CM | POA: Diagnosis not present

## 2023-10-11 DIAGNOSIS — Z7901 Long term (current) use of anticoagulants: Secondary | ICD-10-CM | POA: Diagnosis not present

## 2023-10-11 DIAGNOSIS — I252 Old myocardial infarction: Secondary | ICD-10-CM | POA: Diagnosis not present

## 2023-10-11 NOTE — Telephone Encounter (Signed)
   Pre-operative Risk Assessment    Patient Name: [name] [name]  DOB: 29-Jun-1965 MRN: [id number]   Date of last office visit: 06/28/23 ALM CLAY, MD Date of next office visit: NONE   Request for Surgical Clearance    Procedure:  COLONOSCOPY  Date of Surgery:  Clearance 11/27/23                                Surgeon:  DR ESTELITA MANAS Surgeon's Group or Practice Name:  [facility] Phone number:  [phone] Fax number:  [phone]   Type of Clearance Requested:   - Medical  - Pharmacy:  Hold Aspirin  and Ticagrelor  (Brilinta )     Type of Anesthesia:  PROPOFOL    Additional requests/questions:    SignedLucie DELENA Ku   10/11/2023, 5:45 PM

## 2023-10-11 NOTE — Telephone Encounter (Signed)
   Name: [name] [name]  DOB: Mar 26, 1965  MRN: [id number]  Primary Cardiologist: Alm Clay, MD   Preoperative team, please contact this patient and set up a phone call appointment for further preoperative risk assessment. Please obtain consent and complete medication review. Thank you for your help.  I confirm that guidance regarding antiplatelet and oral anticoagulation therapy has been completed and, if necessary, noted below.  Per Dr. Clay patient can hold Brilinta  5 to 7 days prior to surgeries and procedures.  I also confirmed the patient resides in the state of [place] . As per [facility] Medical Board telemedicine laws, the patient must reside in the state in which the provider is licensed.   Wyn Raddle, Jackee Shove, NP 10/11/2023, 5:52 PM [facility]

## 2023-10-12 ENCOUNTER — Telehealth: Payer: Self-pay

## 2023-10-12 NOTE — Telephone Encounter (Signed)
 Appointment scheduled for 11/20/2023 @ 9am. Med req and consent are complete.

## 2023-10-12 NOTE — Telephone Encounter (Signed)
  Patient Consent for Virtual Visit         [name] [name] has provided verbal consent on 10/12/2023 for a virtual visit (video or telephone).   Appointment scheduled for 11/20/2023 @ 9am. Med req and consent are complete.          CONSENT FOR VIRTUAL VISIT FOR:  [name] [name]  By participating in this virtual visit I agree to the following:  I hereby voluntarily request, consent and authorize [facility] and its employed or contracted physicians, physician assistants, nurse practitioners or other licensed health care professionals (the Practitioner), to provide me with telemedicine health care services (the "Services) as deemed necessary by the treating Practitioner. I acknowledge and consent to receive the Services by the Practitioner via telemedicine. I understand that the telemedicine visit will involve communicating with the Practitioner through live audiovisual communication technology and the disclosure of certain medical information by electronic transmission. I acknowledge that I have been given the opportunity to request an in-person assessment or other available alternative prior to the telemedicine visit and am voluntarily participating in the telemedicine visit.  I understand that I have the right to withhold or withdraw my consent to the use of telemedicine in the course of my care at any time, without affecting my right to future care or treatment, and that the Practitioner or I may terminate the telemedicine visit at any time. I understand that I have the right to inspect all information obtained and/or recorded in the course of the telemedicine visit and may receive copies of available information for a reasonable fee.  I understand that some of the potential risks of receiving the Services via telemedicine include:  Delay or interruption in medical evaluation due to technological equipment failure or disruption; Information transmitted may not be sufficient  (e.g. poor resolution of images) to allow for appropriate medical decision making by the Practitioner; and/or  In rare instances, security protocols could fail, causing a breach of personal health information.  Furthermore, I acknowledge that it is my responsibility to provide information about my medical history, conditions and care that is complete and accurate to the best of my ability. I acknowledge that Practitioner's advice, recommendations, and/or decision may be based on factors not within their control, such as incomplete or inaccurate data provided by me or distortions of diagnostic images or specimens that may result from electronic transmissions. I understand that the practice of medicine is not an exact science and that Practitioner makes no warranties or guarantees regarding treatment outcomes. I acknowledge that a copy of this consent can be made available to me via my patient portal [facility] MyChart), or I can request a printed copy by calling the office of [facility].    I understand that my insurance will be billed for this visit.   I have read or had this consent read to me. I understand the contents of this consent, which adequately explains the benefits and risks of the Services being provided via telemedicine.  I have been provided ample opportunity to ask questions regarding this consent and the Services and have had my questions answered to my satisfaction. I give my informed consent for the services to be provided through the use of telemedicine in my medical care

## 2023-10-12 NOTE — Telephone Encounter (Signed)
 Called patient regarding scheduling a cardias clearance telehealth appointment. NA, Left message to contact our office to schedule that appointment.

## 2023-11-20 ENCOUNTER — Ambulatory Visit: Attending: Cardiology | Admitting: Student

## 2023-11-20 DIAGNOSIS — Z01810 Encounter for preprocedural cardiovascular examination: Secondary | ICD-10-CM | POA: Diagnosis not present

## 2023-11-20 NOTE — Progress Notes (Signed)
 Virtual Visit via Telephone Note   Because of Christ Fullenwider [name] [name]'s co-morbid illnesses, he is at least at moderate risk for complications without adequate follow up.  This format is felt to be most appropriate for this patient at this time.  The patient did not have access to video technology/had technical difficulties with video requiring transitioning to audio format only (telephone).  All issues noted in this document were discussed and addressed.  No physical exam could be performed with this format.  Please refer to the patient's chart for his consent to telehealth for [facility].  Evaluation Performed:  Preoperative cardiovascular risk assessment _____________   Date:  11/20/2023   Patient ID:  [name] [name], DOB [birth year], MRN [id number] Patient Location:  Home Provider location:   Office  Primary Care Provider:  Berneta [name] Sayre, MD Primary Cardiologist:  [name] Clay, MD  Chief Complaint / Patient Profile   58 y.o. y/o male with a h/o CAD s/p PCI with 3 overlapping DES to proximal to mid LAD and DES to OM 17 May 2019 in the setting of STEMI, [facility] July 2022 demonstrated widely patent overlapping stents and OM 2 stent, ischemic cardiomyopathy with improved LV function, hypertension, hyperlipidemia who is pending colonoscopy by Dr. Saintclair on 11/27/2023 and presents today for telephonic preoperative cardiovascular risk assessment.  History of Present Illness    [name] [name] is a 58 y.o. male who presents via audio/video conferencing for a telehealth visit today.  Pt was last seen in cardiology clinic on 06/28/2023 by Dr. Clay.  At that time [name] [name] was stable from a cardiac standpoint.  The patient is now pending procedure as outlined above. Since his last visit, he is doing well. Patient denies shortness of breath, dyspnea on exertion, lower extremity edema, orthopnea or PND. No chest pain, pressure, or tightness. No palpitations. He is  active walking for exercise for 30 minutes every other day. He also works full time.   Past Medical History    Past Medical History:  Diagnosis Date   History of acute anterior wall MI 05/29/2019   100% LAD after SP1 -> (long lesion); 90% prox OM 2 (mod LPL 60%), ~100% CTO of RPDA (high bifurcation, fills via L-R collateral flow) - 3 overlapping DES in LAD & 1 in OM2   Hyperlipidemia LDL goal <70    Multiple vessel coronary artery disease 05/29/2019   Cath 05/29/2019 : Anterior STEMI -> Severe Three-Vessel Disease:  CULPRIT LESION 100% LAD after SP1 (long); 90% pOM 2 (with mod LPL), ~100 CTO of RPDA (high bifurcation, L-R collateral); Successful DES PCI p-mLAD w/ 3 overlapping Resolute Onyx DES (3.0 x 12, 2.5 x 38, & 2.25 x 15 --> 3.65-2.8 mm), OM2 Resolute Onyx DES 2 x 12 - 2.2 mm.    Past Surgical History:  Procedure Laterality Date   CHOLECYSTECTOMY N/A 08/24/2023   Procedure: LAPAROSCOPIC CHOLECYSTECTOMY;  Surgeon: [name] Fine, MD;  Location: [facility] OR;  Service: General;  Laterality: N/A;   CORONARY STENT INTERVENTION N/A 05/29/2019   Procedure: CORONARY STENT INTERVENTION;  Surgeon: [name] [name] ORN, MD;  Location: [facility] INVASIVE CV LAB;  Service: Cardiovascular;; 2nd LESION: OM 2 with Resolute Onyx DES 2.0 mm x 12 mm--postdilated to 2.2 mm)   CORONARY/GRAFT ACUTE MI REVASCULARIZATION N/A 05/29/2019   Procedure: CORONARY/GRAFT ACUTE MI REVASCULARIZATION;  Surgeon: [name] [name] ORN, MD;  Location: [facility] INVASIVE CV LAB;  Service: Cardiovascular;  Successful DES PCI of extensive proximal to mid LAD using 3  overlapping Resolute Onyx DES stents (3.0 mm x 12 mm, 2.5 mm x 38 mm, and 2.25 mm x 15 mm -> entire segment postdilated from 3.65 mm down to 2.8 mm)    INDOCYANINE GREEN  FLUORESCENCE IMAGING (ICG) N/A 08/24/2023   Procedure: INDOCYANINE GREEN  FLUORESCENCE IMAGING (ICG);  Surgeon: [name] Fine, MD;  Location: [facility] OR;  Service: General;  Laterality: N/A;   LEFT HEART CATH AND CORONARY  ANGIOGRAPHY N/A 05/29/2019   Procedure: LEFT HEART CATH AND CORONARY ANGIOGRAPHY;  Surgeon: Anner [name] ORN, MD;  Location: [facility] INVASIVE CV LAB;  Service: Cardiovascular;; CULPRIT LESION 100% LAD after SP1 (long lesion); 90% proximal OM 2 (with moderate LPL), 100% CTO of RPDA (high bifurcation, fills via L-R collaterals; EF ~35-40% mid-apical Hypo-AK. LVEDP 22 mmHg.   LEFT HEART CATH AND CORONARY ANGIOGRAPHY N/A 08/20/2020   Procedure: LEFT HEART CATH AND CORONARY ANGIOGRAPHY;  Surgeon: Anner [name] ORN, MD;  Location: [facility] INVASIVE CV LAB;  Service: Cardiovascular; widely patent overlapping stents in the LAD with 95% ISR.  Widely patent LAD stent followed by 30% stenosis.  RPDA 100% occluded after high bifurcation with bridging, or-R and L and R collaterals.  EF 50 to 55%.  Normal EDP.   TRANSTHORACIC ECHOCARDIOGRAM  09/2019   EF ~ 55-60%, Mid-apical Inferoseptal HK & AK of apical and anteroseptal wall.  Gr II DD.  Normal RV size and function.  Mild aortic sclerosis.   TRANSTHORACIC ECHOCARDIOGRAM  05/2019    (Anterior STEMI) --> EF 35 to 40%.  Entire anterior wall, mid and distal anterior septum and apex are akinetic.  Mild LVH, GR 1 DD.    Allergies  No Known Allergies  Home Medications    Prior to Admission medications   Medication Sig Start Date End Date Taking? Authorizing Provider  ASPIRIN  81 PO Take 324 mg by mouth once as needed (chest pain).    [provider]  atorvastatin  (LIPITOR ) 40 MG tablet TAKE 1 TABLET BY MOUTH EVERY DAY Patient taking differently: Take 40 mg by mouth every evening. 08/22/23   Anner [name] ORN, MD  Azelastine  HCl 137 MCG/SPRAY SOLN 1 spray each nostril twice daily. Patient taking differently: Place 1 spray into both nostrils 2 (two) times daily as needed (allergies, rhinitis). 05/26/23   [name] [name] Sayre, MD  carvedilol  (COREG ) 3.125 MG tablet TAKE 1 TABLET BY MOUTH TWICE A DAY 02/20/23   [name] Sober, NP  diclofenac  Sodium (VOLTAREN ) 1 % GEL  Apply a small grape sized dollop onto affected joint up to 4 times daily. Patient taking differently: Apply 1 Application topically 2 (two) times daily as needed (pain). 11/16/21   [name] [name] Sayre, MD  ezetimibe  (ZETIA ) 10 MG tablet TAKE 1 TABLET BY MOUTH EVERY DAY 09/05/23   Anner [name] ORN, MD  nitroGLYCERIN  (NITROSTAT ) 0.4 MG SL tablet Place 1 tablet (0.4 mg total) under the tongue every 5 (five) minutes as needed for chest pain (up to 3 doses. If taking 3rd dose, call 911.). 07/04/22   [name] Sober, NP  oxyCODONE  (ROXICODONE ) 5 MG immediate release tablet Take 1 tablet (5 mg total) by mouth every 4 (four) hours as needed. 08/24/23   [name] Fine, MD  ticagrelor  (BRILINTA ) 60 MG TABS tablet TAKE 1 TABLET BY MOUTH TWICE A DAY 09/05/23   Anner [name] ORN, MD    Physical Exam    Vital Signs:  [name] [name] does not have vital signs available for review today.  Given telephonic nature of communication, physical exam is limited.  AAOx3. NAD. Normal affect.  Speech and respirations are unlabored.   Assessment & Plan    Primary Cardiologist: [name] Clay, MD  Preoperative cardiovascular risk assessment.  Colonoscopy by Dr. Saintclair on 11/27/2023  Chart reviewed as part of pre-operative protocol coverage. According to the RCRI, patient has a 6.6% risk of MACE. Patient reports activity equivalent to >4.0 METS (walks for 30 minutes every other day).   Given past medical history and time since last visit, based on ACC/AHA guidelines, [name] [name] would be at acceptable risk for the planned procedure without further cardiovascular testing.   Patient was advised that if he develops new symptoms prior to surgery to contact our office to arrange a follow-up appointment.  he verbalized understanding.  Per office protocol and Dr. Clay, he may hold Brilinta  for 5 days prior to procedure and should resume as soon as hemodynamically stable postoperatively. Patient should  continue aspirin  81 mg daily throughout peri-procedural period.      I will route this recommendation to the requesting party via Epic fax function.  Please call with questions.  Time:   Today, I have spent 5 minutes with the patient with telehealth technology discussing medical history, symptoms, and management plan.     Barnie Hila, NP  11/20/2023, 7:05 AM

## 2023-11-27 ENCOUNTER — Ambulatory Visit: Admitting: Family Medicine

## 2023-11-27 DIAGNOSIS — K648 Other hemorrhoids: Secondary | ICD-10-CM | POA: Diagnosis not present

## 2023-11-27 DIAGNOSIS — Z09 Encounter for follow-up examination after completed treatment for conditions other than malignant neoplasm: Secondary | ICD-10-CM | POA: Diagnosis not present

## 2023-11-27 DIAGNOSIS — Z860101 Personal history of adenomatous and serrated colon polyps: Secondary | ICD-10-CM | POA: Diagnosis not present

## 2023-11-27 LAB — HM COLONOSCOPY

## 2023-12-18 ENCOUNTER — Encounter: Payer: Self-pay | Admitting: Family Medicine

## 2024-02-22 ENCOUNTER — Other Ambulatory Visit: Payer: Self-pay | Admitting: Student

## 2024-02-23 ENCOUNTER — Other Ambulatory Visit (HOSPITAL_BASED_OUTPATIENT_CLINIC_OR_DEPARTMENT_OTHER): Payer: Self-pay

## 2024-03-08 ENCOUNTER — Other Ambulatory Visit: Payer: Self-pay | Admitting: Cardiology

## 2024-05-27 ENCOUNTER — Encounter: Admitting: Family Medicine
